# Patient Record
Sex: Male | Born: 1935 | Race: White | Hispanic: No | Smoking: Never smoker
Health system: Southern US, Community
[De-identification: ages and names within clinical notes are randomized; demographics above are authoritative.]

## PROBLEM LIST (undated history)

## (undated) DIAGNOSIS — H3552 Pigmentary retinal dystrophy: Secondary | ICD-10-CM

## (undated) DIAGNOSIS — M1990 Unspecified osteoarthritis, unspecified site: Secondary | ICD-10-CM

## (undated) DIAGNOSIS — F329 Major depressive disorder, single episode, unspecified: Secondary | ICD-10-CM

## (undated) DIAGNOSIS — F32A Depression, unspecified: Secondary | ICD-10-CM

## (undated) DIAGNOSIS — G4700 Insomnia, unspecified: Secondary | ICD-10-CM

## (undated) DIAGNOSIS — F419 Anxiety disorder, unspecified: Secondary | ICD-10-CM

## (undated) DIAGNOSIS — I10 Essential (primary) hypertension: Secondary | ICD-10-CM

## (undated) DIAGNOSIS — K219 Gastro-esophageal reflux disease without esophagitis: Secondary | ICD-10-CM

## (undated) DIAGNOSIS — C01 Malignant neoplasm of base of tongue: Secondary | ICD-10-CM

## (undated) HISTORY — DX: Pigmentary retinal dystrophy: H35.52

## (undated) HISTORY — DX: Malignant neoplasm of base of tongue: C01

## (undated) HISTORY — PX: TONSILECTOMY, ADENOIDECTOMY, BILATERAL MYRINGOTOMY AND TUBES: SHX2538

## (undated) HISTORY — PX: OTHER SURGICAL HISTORY: SHX169

## (undated) HISTORY — PX: KNEE SURGERY: SHX244

## (undated) HISTORY — DX: Insomnia, unspecified: G47.00

---

## 2014-02-15 DIAGNOSIS — G629 Polyneuropathy, unspecified: Secondary | ICD-10-CM | POA: Insufficient documentation

## 2014-10-23 DIAGNOSIS — K219 Gastro-esophageal reflux disease without esophagitis: Secondary | ICD-10-CM | POA: Insufficient documentation

## 2014-10-23 DIAGNOSIS — M25519 Pain in unspecified shoulder: Secondary | ICD-10-CM | POA: Insufficient documentation

## 2014-10-23 DIAGNOSIS — J302 Other seasonal allergic rhinitis: Secondary | ICD-10-CM | POA: Insufficient documentation

## 2014-10-24 HISTORY — PX: BIOPSY TONGUE: PRO39

## 2014-10-30 ENCOUNTER — Telehealth: Payer: Self-pay | Admitting: *Deleted

## 2014-10-30 NOTE — Telephone Encounter (Signed)
Pathology should be ready at [facility] 11/01/14.  Requested CD's today

## 2014-11-01 ENCOUNTER — Telehealth: Payer: Self-pay | Admitting: *Deleted

## 2014-11-01 NOTE — Telephone Encounter (Signed)
  Oncology Nurse Navigator Documentation Referral date to RadOnc/MedOnc: 10/25/14 (referral rec'd 10/31/14) (11/01/14 1454) Navigator Encounter Type: Introductory phone call (11/01/14 1454)     Placed introductory phone call to patient, spoke with his dtr [name]. Introduced myself at the Oncology Nurse Navigator that works with Dr. Isidore Moos with whom he has an appt 11/08/14 1230/1300.  She confirmed understanding. She explained that [place] approval is pending for this appt.   He is presently being seen by [facility]; Dr. [name] is his PCP, Dr. Aggie Moats is his ENT.  Dr. Aggie Moats referred pt to Dr. Conley Canal at [facility]. MedOnc referral has not been made pending further discussion between Dr. Aggie Moats and Dr. Conley Canal. She further stated their preference is he receive chemotherapy, if appropriate, at [facility] along with RT. I provided her my contact information, encouraged her to call with questions prior to next week's appt.  [name], RN, BSN, [place] at [place] [phone]                     Time Spent with Patient: 30 (11/01/14 1454)

## 2014-11-05 ENCOUNTER — Telehealth: Payer: Self-pay | Admitting: *Deleted

## 2014-11-05 NOTE — Telephone Encounter (Signed)
REQUESTED PATHOLOGY TO BE FAXED TO [place].  Helene Kelp will check with Dr. Conley Canal before faxing as path has been amended.

## 2014-11-07 ENCOUNTER — Telehealth: Payer: Self-pay | Admitting: *Deleted

## 2014-11-07 NOTE — Telephone Encounter (Signed)
Path received this afternoon.  [facility] cannot send CD's as they are from the [place] in [place].  Patient's family working on picking up CD at [facility].

## 2014-11-07 NOTE — Telephone Encounter (Signed)
  Oncology Nurse Navigator Documentation     Patient Visit Type: Follow-up (11/07/14 1653)     Called patient, spoke with his dtr. Informed her that [place] authorization was received today for her dad to be seen by RadOnc and [facility] at [facility].  I informed her of appts scheduled for tomorrow:  12:30/1:00 with Dr. Isidore Moos, 1400 with Dr. Alvy Bimler.  I encouraged her to call me tomorrow morning with any questions.   [name], RN, BSN, [place] at [place] [phone]                   Time Spent with Patient: 15 (11/07/14 1653)

## 2014-11-07 NOTE — Telephone Encounter (Signed)
Patient's son-in-law, Weyman Croon, says patient is going to [facility] this afternoon to pick up CD's.

## 2014-11-08 ENCOUNTER — Encounter: Payer: Self-pay | Admitting: Hematology and Oncology

## 2014-11-08 ENCOUNTER — Encounter: Payer: Self-pay | Admitting: Radiation Oncology

## 2014-11-08 ENCOUNTER — Ambulatory Visit (HOSPITAL_BASED_OUTPATIENT_CLINIC_OR_DEPARTMENT_OTHER): Payer: Medicare PPO | Admitting: Hematology and Oncology

## 2014-11-08 ENCOUNTER — Ambulatory Visit
Admission: RE | Admit: 2014-11-08 | Discharge: 2014-11-08 | Disposition: A | Discharge status: Home / Self Care | Payer: Medicare PPO | Source: Ambulatory Visit | Attending: Radiation Oncology | Admitting: Radiation Oncology

## 2014-11-08 ENCOUNTER — Ambulatory Visit: Admission: RE | Admit: 2014-11-08 | Payer: Non-veteran care | Source: Ambulatory Visit | Admitting: Radiation Oncology

## 2014-11-08 ENCOUNTER — Ambulatory Visit: Payer: Non-veteran care

## 2014-11-08 ENCOUNTER — Ambulatory Visit
Admission: RE | Admit: 2014-11-08 | Discharge: 2014-11-08 | Disposition: A | Discharge status: Home / Self Care | Payer: Non-veteran care | Source: Ambulatory Visit | Attending: Radiation Oncology | Admitting: Radiation Oncology

## 2014-11-08 ENCOUNTER — Encounter: Payer: Self-pay | Admitting: *Deleted

## 2014-11-08 VITALS — BP 122/46 | HR 63 | Temp 98.8°F | Resp 16 | Ht 69.0 in | Wt 169.2 lb

## 2014-11-08 VITALS — BP 123/68 | HR 65 | Temp 98.2°F | Resp 18 | Ht 69.0 in | Wt 169.8 lb

## 2014-11-08 DIAGNOSIS — R1310 Dysphagia, unspecified: Secondary | ICD-10-CM

## 2014-11-08 DIAGNOSIS — C01 Malignant neoplasm of base of tongue: Secondary | ICD-10-CM | POA: Insufficient documentation

## 2014-11-08 DIAGNOSIS — Z23 Encounter for immunization: Secondary | ICD-10-CM | POA: Diagnosis not present

## 2014-11-08 DIAGNOSIS — R042 Hemoptysis: Secondary | ICD-10-CM | POA: Insufficient documentation

## 2014-11-08 DIAGNOSIS — I10 Essential (primary) hypertension: Secondary | ICD-10-CM | POA: Diagnosis not present

## 2014-11-08 DIAGNOSIS — Z8041 Family history of malignant neoplasm of ovary: Secondary | ICD-10-CM | POA: Diagnosis not present

## 2014-11-08 DIAGNOSIS — R634 Abnormal weight loss: Secondary | ICD-10-CM

## 2014-11-08 DIAGNOSIS — Z510 Encounter for antineoplastic radiation therapy: Secondary | ICD-10-CM | POA: Diagnosis not present

## 2014-11-08 DIAGNOSIS — Z803 Family history of malignant neoplasm of breast: Secondary | ICD-10-CM | POA: Insufficient documentation

## 2014-11-08 DIAGNOSIS — F1010 Alcohol abuse, uncomplicated: Secondary | ICD-10-CM | POA: Diagnosis not present

## 2014-11-08 DIAGNOSIS — C029 Malignant neoplasm of tongue, unspecified: Secondary | ICD-10-CM

## 2014-11-08 HISTORY — DX: Essential (primary) hypertension: I10

## 2014-11-08 MED ORDER — INFLUENZA VAC SPLIT QUAD 0.5 ML IM SUSY
0.5000 mL | PREFILLED_SYRINGE | Freq: Once | INTRAMUSCULAR | Status: AC
  Administered 2014-11-08: 0.5 mL via INTRAMUSCULAR
  Filled 2014-11-08: qty 0.5

## 2014-11-08 NOTE — Progress Notes (Addendum)
Head and Neck Cancer Location of Tumor / Histology: left tongue base mass - invasive squamous cell carcinoma  Patient presented with left tongue mass that he and his family noticed in May.  He also reported his tongue felt numb and he thought he was having an allergic reaction.  He was thought to have a stroke but had an MRI and the mass was found.  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  See comment.   Nutrition Status Yes No Comments  Weight changes? [x]  []  Has lost about 45 lbs over a year.  Swallowing concerns? []  []  Reports having to cough occasionally when eating hard foods.  He is eating a softer diet.  PEG? [x]  []  Gastrostomy tube placed 10/24/14 - not using it now.   Referrals Yes No Comments  Social Work? []  [x]    Dentistry? []  [x]    Swallowing therapy? []  [x]    Nutrition? []  [x]    Med/Onc? [x]  []  To see Dr. Alvy Bimler today.   Safety Issues Yes No Comments  Prior radiation? []  [x]    Pacemaker/ICD? []  [x]    Possible current pregnancy? []  [x]    Is the patient on methotrexate? []  [x]     Tobacco/Marijuana/Snuff/ETOH use: never smoked, drinks occasional drink 1 per month, doesn't use marijuana/snuff.  Past/Anticipated interventions by otolaryngology, if any: surgery 10/24/14  Past/Anticipated interventions by medical oncology, if any: unknown  Current Complaints / other details:  Patient is here with his daughter.  BP 122/46 mmHg  Pulse 63  Temp(Src) 98.8 F (37.1 C) (Oral)  Resp 16  Ht 5\' 9"  (1.753 m)  Wt 169 lb 3.2 oz (76.749 kg)  BMI 24.98 kg/m2  SpO2 100%

## 2014-11-08 NOTE — Assessment & Plan Note (Signed)
This is related to the ulcerated mass. He will be referred to speech and language therapist for assessment and formal swallow evaluation

## 2014-11-08 NOTE — Progress Notes (Signed)
Oncology Nurse Navigator Documentation   Navigator Encounter Type: Initial MedOnc;Initial RadOnc (11/08/14 1240)     Barriers/Navigation Needs: Education (11/08/14 1240)     Met with patient during initial consult with Dr. Isidore Moos.  He was accompanied by his dtr [name].  1. Further introduced myself as his Navigator, explained my role as a member of the Care Team.   2. Provided New Patient Information packet, discussed contents:  Contact information for physician(s), myself, other members of the Care Team.  Advance Directive information ([place] blue pamphlet with LCSW contact info)  Fall Prevention Patient Safety Plan  Appointment Guideline  ACS Referral form  [place] campus map with highlight of [place] 3. Provided introductory explanation of radiation treatment including SIM planning and purpose of Aquaplast head and shoulder mask, showed them example.   4. He had "button" PEG placed 10/24/14 at [facility].  He reported he is not using PEG for nutritional/hydration purposes, nor is he flushing on a daily basis.    I explained importance of daily flushing with 60-120 cc free water to maintain patency.  He verbalized understanding.  Escorted pt to initial consult with Dr. Alvy Bimler.   1. Provided educational handout for PAC. 2. Reiterated importance of flushing PEG on daily basis.  Encouraged him to bring equipment to demonstrate skill 3. We discussed PEG care, I encouraged him to shower as usual, dry site well afterwards. 4. Showed them the location of Dr. Ritta Slot office and [facility] as reference for future appts, including arrival procedure for these appts.   5. They verbalized understanding of information provided.  I encouraged them to call with questions/concerns, they verbalized understanding.  [name], RN, BSN, [place] at [place] [phone]                Time Spent with Patient: 120  (11/08/14 1240)

## 2014-11-08 NOTE — Progress Notes (Signed)
Please see the Nurse Progress Note in the MD Initial Consult Encounter for this patient. 

## 2014-11-08 NOTE — Assessment & Plan Note (Addendum)
This is related to cancer. Dr. Isidore Moos had put in requested for him to see dietitian.  I reinforced the importance of adequate calorie intake and recommend he take nutritional supplements as tolerated

## 2014-11-08 NOTE — Progress Notes (Signed)
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 male is here because of newly diagnosed  Base of tongue cancer. I review his records extensively and collaborated the history with the patient. Summary of oncologic history as follows: Oncology History   Cancer of base of tongue   Staging form: Lip and Oral Cavity, AJCC 7th Edition     Clinical stage from 11/08/2014: Stage IVA (T4a, N2c, M0) - Signed by Heath Lark, MD on 11/08/2014       Cancer of base of tongue   07/08/2014 Miscellaneous  the patient noted tongue swelling / mass   10/04/2014 Imaging  CT scan done at the [facility] show large tongue mass with bilateral neck lymphadenopathy   10/07/2014 Miscellaneous  he was seen at the [facility] for evaluation was noted to have tongue cancer   10/10/2014 Imaging  PET CT scan from the [facility] showed extensive regional lymphadenopathy on the left side including supraclavicular lymph node involvement and possibly right jugulodigastric lymph node involvement.   10/24/2014 Procedure  he underwent laryngoscopy and biopsy. there is a large ulcerative tongue a mass on the left involving the tonsil, soft palate, epiglottis.   10/24/2014 Pathology Results  pathology report from [facility] show squamous cell carcinoma with basaloid feature, P 16 positive by PCR   he has baseline bilateral hearing deficit. He has difficulties with chewing food, mild swallowing difficulties, mild painful swallowing, and 45 pounds abnormal weight loss. He also complained of intermittent hemoptysis. He has slurring as patient to do the tongue mass. Recently, he had a fall due to his tunnel vision from retinitis pigmentosa. He denies balance difficulties. He has a feeding tube placed but has not started using it yet. He rated his throat pain at 3  out of 10 and he uses Aleve as needed for pain. He complained of fatigue and anorexia  MEDICAL HISTORY:  Past Medical History  Diagnosis Date  . Tongue cancer   . Hypertension   . Retinitis pigmentosa of both eyes     SURGICAL HISTORY: Past Surgical History  Procedure Laterality Date  . Biopsy tongue  10/24/14  . Knee surgery Right   . Tonsilectomy, adenoidectomy, bilateral myringotomy and tubes      SOCIAL HISTORY: Social History   Social History  . Marital Status: Unknown    Spouse Name: N/A  . Number of Children: 2  . Years of Education: N/A   Occupational History  . Acupuncturist    Social History Main Topics  . Smoking status: Never Smoker   . Smokeless tobacco: Never Used  . Alcohol Use: 0.0 oz/week    0 Standard drinks or equivalent per week     Comment: 1 drink per month  . Drug Use: Not on file  . Sexual Activity: Not on file   Other Topics Concern  . Not on file   Social History Narrative    FAMILY HISTORY: Family History  Problem Relation Age of Onset  . Ovarian cancer Sister   . Heart attack Father   . Breast cancer      neice on Sister's side.  Also had tongue cancer.    ALLERGIES:  has No Known Allergies.  MEDICATIONS:  Current Outpatient Prescriptions  Medication Sig Dispense Refill  . cetirizine (ZYRTEC) 10 MG tablet  Take 10 mg by mouth.    . fluticasone (FLONASE) 50 MCG/ACT nasal spray Place 1 spray into the nose.    . Menthol, Topical Analgesic, 16 % LIQD     . metoprolol (LOPRESSOR) 50 MG tablet Take 25 mg by mouth 2 (two) times daily.     . naproxen sodium (RA NAPROXEN SODIUM) 220 MG tablet Take 220 mg by mouth.    . Nutritional Supplements (FEEDING SUPPLEMENT, OSMOLITE 1.5 CAL,) LIQD 240 mLs by Nasogastric route.    Marland Kitchen omeprazole (PRILOSEC) 20 MG capsule Take 20 mg by mouth.    . sodium chloride (V-R NASAL SPRAY SALINE) 0.65 % nasal spray Place 1 spray into the nose.    . zolpidem (AMBIEN) 10 MG tablet Take 5 mg by mouth.      No current facility-administered medications for this visit.    REVIEW OF SYSTEMS:   Constitutional: Denies fevers, chills or abnormal night sweats Eyes: Denies blurriness of vision, double vision or watery eyes Respiratory: Denies cough, dyspnea or wheezes Cardiovascular: Denies palpitation, chest discomfort or lower extremity swelling Gastrointestinal:  Denies nausea, heartburn or change in bowel habits Skin: Denies abnormal skin rashes Lymphatics: Denies new lymphadenopathy or easy bruising Neurological:Denies numbness, tingling or new weaknesses Behavioral/Psych: Mood is stable, no new changes  All other systems were reviewed with the patient and are negative.  PHYSICAL EXAMINATION: ECOG PERFORMANCE STATUS: 1 - Symptomatic but completely ambulatory  Filed Vitals:   11/08/14 1427  BP: 123/68  Pulse: 65  Temp: 98.2 F (36.8 C)  Resp: 18   Filed Weights   11/08/14 1427  Weight: 169 lb 12.8 oz (77.021 kg)    GENERAL:alert, no distress and comfortable. He looks elderly SKIN: skin color, texture, turgor are normal, no rashes or significant lesions EYES: normal, conjunctiva are pink and non-injected, sclera clear OROPHARYNX:noted tongue deviation and mass at the base of the tongue with mild abnormality in the left tonsillar region NECK: supple, thyroid normal size, non-tender, without nodularity LYMPH:  Palpable cervical lymphadenopathy in the neck LUNGS: clear to auscultation and percussion with normal breathing effort HEART: regular rate & rhythm and no murmurs and no lower extremity edema ABDOMEN:abdomen soft, non-tender and normal bowel sounds. Feeding tube site looks okay Musculoskeletal:no cyanosis of digits and no clubbing  PSYCH: alert & oriented x 3 with fluent speech NEURO: no focal motor/sensory deficits  LABORATORY DATA:  I have reviewed the data as listed RADIOGRAPHIC STUDIES: I have personally reviewed the radiological images as listed and agreed with the  findings in the report.  ASSESSMENT:  Newly diagnosed squamous cell carcinoma of the Head & Neck, HPV Positive  PLAN:  Cancer of base of tongue  We have extensive discussion. The patient nderstands the importance of multidisciplinary approach.  I think the patient would benefit from concurrent chemoradiation treatment. However, with his bilateral hearing loss at baseline, I would not recommend the use of cisplatin. The patient would be a candidate for cetuximab. He would need port placement and chemotherapy education class. I would hold off scheduling those until he has full dental evaluation. I will see him back prior to start of treatment. We discussed briefly the benefit of addition of concurrent systemic treatment. He agreed with the plan of care.  Weight loss This is related to cancer. Dr. Isidore Moos had put in requested for him to see dietitian.  I reinforced the importance of adequate calorie intake and recommend he take nutritional supplements as tolerated  Hemoptysis  He had intermittent  hemoptysis related to the ulcerated mass. We will observe for now.  Dysphagia  This is related to the ulcerated mass. He will be referred to speech and language therapist for assessment and formal swallow evaluation    All questions were answered. The patient knows to call the clinic with any problems, questions or concerns. I spent 55 minutes counseling the patient face to face. The total time spent in the appointment was 60 minutes and more than 50% was on counseling.     [facility], [place], MD 11/08/2014 3:54 PM

## 2014-11-08 NOTE — Assessment & Plan Note (Signed)
We have extensive discussion. The patient nderstands the importance of multidisciplinary approach.  I think the patient would benefit from concurrent chemoradiation treatment. However, with his bilateral hearing loss at baseline, I would not recommend the use of cisplatin. The patient would be a candidate for cetuximab. He would need port placement and chemotherapy education class. I would hold off scheduling those until he has full dental evaluation. I will see him back prior to start of treatment. We discussed briefly the benefit of addition of concurrent systemic treatment. He agreed with the plan of care.

## 2014-11-08 NOTE — Assessment & Plan Note (Signed)
He had intermittent hemoptysis related to the ulcerated mass. We will observe for now.

## 2014-11-08 NOTE — Addendum Note (Signed)
Encounter addended by: Jacqulyn Liner, RN on: 11/08/2014  2:42 PM<BR>     Documentation filed: Charges VN

## 2014-11-08 NOTE — Progress Notes (Signed)
Radiation Oncology         [phone] ________________________________  Initial Outpatient Consultation  Name: Kenneth Lembke. Jordan MRN: [id number]  Date: 11/08/2014  DOB: 1935/03/02  CC:No primary care provider on file.  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 Cancer of base of tongue 141.0 C01     HISTORY OF PRESENT ILLNESS:Kenneth Jordan is a 79 y.o. male who presented with invasive cell carcinoma with basaloid features of the left tongue.  "When I was a kid I used to have ulcers in my mouth. I had a burning sensation on my tongue," the patient stated thinking that he simply had an ulcer on his tongue, when he first noticed something was out of the ordinary. "The original pain was on my tongue. It was swollen a little bit." On May 13th of 2016, he passed out briefly coming out of the shower due to being four different blood pressure medications, which caused his family to take him to the [place] doctor. After changing blood pressure medications, there was no alleviation of symptoms, and his speech became effected in June of 2016.  The patient presented with left tongue mass that he and his family noticed in May.  He also reported his tongue felt numb and he thought he was having an allergic reaction.  He was thought to have a stroke but had an MRI and the mass was found. He has trouble physically speaking and forming his words. On August 5th of 2016 the "doctor at the [place] in [place]" diagnosed the patient and recommended surgery. However, the recommended surgery could not be completed at this facility, so he was referred to Dr. Conley Canal. However, Dr. Conley Canal upon meeting the patient did not agree with the recommendation of surgery. Instead, Dr. Conley Canal suggested a biopsy take place, a feeding tube be administered and radiation therapy  treatments were best to manage the patient's disease. A biopsy was only performed at [facility]. Scans that were completed were not available earlier today. ([facility] that [facility] had to be picked [facility] in person.) The patient stated that the scans were completed in [place], [place] and provided disks at today's appointment.  I have reviewed his imaging which is provided via disks, though I do not have the paper reports. A CT scan of the neck on 10/04/2014 shows a left vasive tongue mass, extending into the oral tongue, at least 5cm in anterior to posterior measurement, crossing midline. He has a conglomerate of lymph nodes that appear pathologic on the left side of his cervical neck, extending from levels 2 through 4. As well as a 1.5cm lymph node in the level II region of the right neck, which appears malignant. He has a 50mm left retropharyngeal node that appears pathological. The PET scan performed on 10/09/2014 confirms hypermetabolic activity in the base of tongue mass and bilateral neck disease. I cannot appreciate any distance metastatic disease to my eye.  Other symptoms of concern vocalized include the patient spitting up blood. "That's the first thing that scared me," the patient explained, that he has spit up blood several times, including yesterday morning. In describing the blood being spit up, "It's no coagulated." He also reports having trouble swallowing, but is not currently choking on food. He has lost forty-five pounds over the past year. The patient confirms symptoms of fatigue, but he's  able to dress himself and perform other duties alone without assistance. He is currently having to live locally with his daughter. This situation frustrated the patient as he just moved into an apartment in [place], [place]. The patient's daughter expressed that the [place] may  cover dental expenses, which will encourage her father to visit the dentist on a more frequent basis. The patient was concerned about  being a burden on his daughter.  Biopsies revealed: 10/24/2014  HPV testing result was positive for HPV HR Type 16 At time of laryngoscopy, Dr. Jefm [facility] at [facility] appreciated a large left tongue base mass   FINAL PATHOLOGIC DIAGNOSIS  MICROSCOPIC EXAMINATION AND DIAGNOSIS    A.OROPHARYNX, LEFT TONGUE BASE, BIOPSY:  Invasive squamous cell carcinoma with basaloid features.    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  See comment.   Nutrition Status Yes No Comments  Weight changes? [x]  []  Has lost about 45 lbs over a year.  Swallowing concerns? []  []  Reports having to cough occasionally when eating hard foods.  He is eating a softer diet.  PEG? [x]  []  Gastrostomy tube placed 10/24/14 - not using it now.   Referrals Yes No Comments  Social Work? []  [x]    Dentistry? []  [x]    Swallowing therapy? []  [x]    Nutrition? []  [x]    Med/Onc? [x]  []  To see Dr. Alvy Bimler today.   Safety Issues Yes No Comments  Prior radiation? []  [x]    Pacemaker/ICD? []  [x]    Possible current pregnancy? []  [x]    Is the patient on methotrexate? []  [x]     Tobacco/Marijuana/Snuff/ETOH use: never chewed tobacco or smoked, drinks occasional drink 1 per month, doesn't use marijuana/snuff.  Past/Anticipated interventions by otolaryngology, if any: bx 10/24/2014; not felt to be a good surgical candidate  Past/Anticipated interventions by medical oncology, if any: unknown  Current Complaints / other details:  Patient is here with his daughter. They are frustrated with complicated communication with the [place]. He does not have the support in [place] to sustain treatments in the [place]: No  PAST MEDICAL HISTORY:  has a past medical history of Tongue cancer and Hypertension.    PAST SURGICAL HISTORY: Past Surgical History  Procedure Laterality Date  . Biopsy tongue  10/24/14  . Knee surgery  Right   . Tonsilectomy, adenoidectomy, bilateral myringotomy and tubes      FAMILY HISTORY: family history includes Breast cancer in an other family member; Heart attack in his father; Ovarian cancer in his sister.  SOCIAL HISTORY:  reports that he has never smoked. He has never used smokeless tobacco. He reports that he drinks alcohol.  ALLERGIES: Review of patient's allergies indicates no known allergies.  MEDICATIONS:  Current Outpatient Prescriptions  Medication Sig Dispense Refill  . cetirizine (ZYRTEC) 10 MG tablet Take 10 mg by mouth.    . fluticasone (FLONASE) 50 MCG/ACT nasal spray Place 1 spray into the nose.    . Menthol, Topical Analgesic, 16 % LIQD     . metoprolol (LOPRESSOR) 50 MG tablet Take 25 mg by mouth 2 (two) times daily.     . naproxen sodium (RA NAPROXEN SODIUM) 220 MG tablet Take 220 mg by mouth.    Marland Kitchen omeprazole (PRILOSEC) 20 MG capsule Take 20 mg by mouth.    . sodium chloride (V-R NASAL SPRAY SALINE) 0.65 % nasal spray Place 1 spray into the nose.    . zolpidem (AMBIEN) 10 MG tablet Take  5 mg by mouth.    . Nutritional Supplements (FEEDING SUPPLEMENT, OSMOLITE 1.5 CAL,) LIQD 240 mLs by Nasogastric route.     No current facility-administered medications for this encounter.    REVIEW OF SYSTEMS:  Notable for that above.   PHYSICAL EXAM:  height is 5\' 9"  (1.753 m) and weight is 169 lb 3.2 oz (76.749 kg). His oral temperature is 98.8 F (37.1 C). His blood pressure is 122/46 and his pulse is 63. His respiration is 16 and oxygen saturation is 100%.   General: Alert and oriented, in no acute distress HEENT: Head is normocephalic. Extraocular movements are intact.  Bilateral hearing aids in place. He has some missing teeth and teeth of not good repair, in the molar regions. No dentures or false teeth are noted. The tongue deviates to the right. There is some fullness in the left soft palate, but no exophytic process visible. There is some palpable nodularity over  the posterior left tongue boarding on the base of tongue. Neck: Neck is notable for no palpable masses appreciated in the right cervical or right supraclavicular regions. There is an elongated, firm mass along the sternocleidomastoid muscle on the left, extending from levels 2 through 3  Heart: Regular in rate and rhythm with no murmurs, rubs, or gallops. Chest: Clear to auscultation bilaterally, with no rhonchi, wheezes, or rales. Abdomen: Soft, nontender, nondistended, with no rigidity or guarding. Extremities: No cyanosis or edema. History of gout in the right foot. Lymphatics: see Neck Exam Skin: No concerning lesions. Musculoskeletal: symmetric strength and muscle tone throughout. Neurologic: Cranial nerves II through XII are grossly intact. No obvious focalities. Speech is fluent. Coordination is intact. Psychiatric: Judgment and insight are intact. Affect is appropriate. Upon examination of his PEG tube the site is clean, dry, and intact.  ECOG = 2  0 - Asymptomatic (Fully active, able to carry on all predisease activities without restriction)  1 - Symptomatic but completely ambulatory (Restricted in physically strenuous activity but ambulatory and able to carry out work of a light or sedentary nature. For example, light housework, office work)  2 - Symptomatic, <50% in bed during the day (Ambulatory and capable of all self care but unable to carry out any work activities. Up and about more than 50% of waking hours)  3 - Symptomatic, >50% in bed, but not bedbound (Capable of only limited self-care, confined to bed or chair 50% or more of waking hours)  4 - Bedbound (Completely disabled. Cannot carry on any self-care. Totally confined to bed or chair)  5 - Death   Eustace Pen MM, Creech RH, Tormey DC, et al. [phone]). "Toxicity and response criteria of the [facility]". [place] Oncol. 5 (6): 649-55   LABORATORY DATA:  No results found for: WBC, HGB, HCT, MCV,  PLT CMP  No results found for: NA, K, CL, CO2, GLUCOSE, BUN, CREATININE, CALCIUM, PROT, ALBUMIN, AST, ALT, ALKPHOS, BILITOT, GFRNONAA, GFRAA       RADIOGRAPHY: as above    IMPRESSION/PLAN:  This is a delightful patient with head and neck cancer. I do recommend radiotherapy for this patient.  We discussed the potential risks, benefits, and side effects of radiotherapy. We talked in detail about acute and late effects. We discussed that some of the most bothersome acute effects may be mucositis, dysgeusia, salivary changes, skin irritation, hair loss, dehydration, weight loss and fatigue. We talked about late effects which include but are not necessarily limited to dysphagia, hypothyroidism, nerve injury,  spinal cord injury, xerostomia, trismus, and neck edema. No guarantees of treatment were given. A consent form was signed and placed in the patient's medical record. The patient is enthusiastic about proceeding with treatment. I look forward to participating in the patient's care.   Simulation (treatment planning) will take place after release by dentistry  We also discussed that the treatment of head and neck cancer is a multidisciplinary process to maximize treatment outcomes and quality of life. For this reasons the following referrals have been or will be made:    Medical oncology to discuss chemotherapy     Dentistry for dental evaluation, possible extractions in the radiation fields, and /or advice on reducing risk of cavities, osteoradionecrosis, or other oral issues.    Nutritionist for nutrition support during and after treatment.    Speech language pathology for swallowing and/or speech therapy.    Social work for social support.     Physical therapy due to risk of lymphedema in neck and deconditioning.    Baseline labs including TSH.  He is aware of his appointment with Dr. Alvy Bimler to take place later today (11/08/2014) to discuss treatment options such as chemotherapy. The  patient and his daughter was briefly educated on the difference between an MRI and a PET scan, as there was confusion beforehand. He is advised of a follow up appointment with radiation oncology to take place in the future to discuss the best treatment plan in managing his disease. Surgery alone is not recommended at this time. The patient has been instructed to see the dentist Dr. Enrique Sack as soon as possible. Referrals to a nutritionist due to significant weight loss, a swallowing therapist, and a physical therapist were all explained during today's appointment. The patient is additional aware of the purpose of baseline thyroid tests to take place. All vocalized questions and concerns have been addressed. If the patient develops any further questions or concerns in regards to her treatment and recovery, she has been encouraged to contact Dr. Isidore Moos.    This document serves as a record of services personally performed by Eppie Gibson, MD. It was created on her behalf by Lenn Cal, a trained medical scribe. The creation of this record is based on the scribe's personal observations and the provider's statements to them. This document has been checked and approved by the attending provider. __________________________________________   Eppie Gibson, MD

## 2014-11-11 ENCOUNTER — Encounter: Payer: Self-pay | Admitting: *Deleted

## 2014-11-11 ENCOUNTER — Telehealth: Payer: Self-pay | Admitting: *Deleted

## 2014-11-11 ENCOUNTER — Telehealth (HOSPITAL_COMMUNITY): Payer: Self-pay | Admitting: Dentistry

## 2014-11-11 NOTE — Telephone Encounter (Signed)
[facility] received and physician notes.  Patient brought CD of CT & PET

## 2014-11-11 NOTE — Progress Notes (Signed)
  Oncology Nurse Navigator Documentation    Navigator Encounter Type: Other (11/11/14 1015)         Interventions: Coordination of Care (11/11/14 1015)     Delivered CD with patient's CT and PET imaging to [facility] in [facility] for import into [place], requested disc return to Dr. Isidore Moos.  She verbalized understanding.  [name], RN, BSN, [place] [facility] [place] [phone]           Time Spent with Patient: 15 (11/11/14 1015)

## 2014-11-11 NOTE — Telephone Encounter (Signed)
11/11/2014  Patient:            Kenneth Jordan. Steil Date of Birth:  Aug 02, 1935 MRN:                [id number]   I [facility] for daughter to call me about scheduling dental consult appointment for tomorrow at 8 AM. Daughter called back and is trying to see if [facility] approval for dental appointment is available. Daughter is aware of the need to schedule this appointment ASAP and that appointment may be unavailable until next week if unable to keep this appointment. Lattie Haw Ingram/Dr. Enrique Sack

## 2014-11-12 ENCOUNTER — Encounter: Payer: Self-pay | Admitting: *Deleted

## 2014-11-12 ENCOUNTER — Other Ambulatory Visit: Payer: Self-pay | Admitting: Radiation Oncology

## 2014-11-12 ENCOUNTER — Ambulatory Visit (HOSPITAL_COMMUNITY): Payer: Self-pay | Admitting: Dentistry

## 2014-11-12 ENCOUNTER — Encounter (HOSPITAL_COMMUNITY): Payer: Self-pay | Admitting: Dentistry

## 2014-11-12 VITALS — BP 118/54 | HR 59 | Temp 98.6°F

## 2014-11-12 DIAGNOSIS — K030 Excessive attrition of teeth: Secondary | ICD-10-CM

## 2014-11-12 DIAGNOSIS — C01 Malignant neoplasm of base of tongue: Secondary | ICD-10-CM

## 2014-11-12 DIAGNOSIS — M264 Malocclusion, unspecified: Secondary | ICD-10-CM

## 2014-11-12 DIAGNOSIS — K031 Abrasion of teeth: Secondary | ICD-10-CM

## 2014-11-12 DIAGNOSIS — K08109 Complete loss of teeth, unspecified cause, unspecified class: Secondary | ICD-10-CM

## 2014-11-12 DIAGNOSIS — Z01818 Encounter for other preprocedural examination: Secondary | ICD-10-CM

## 2014-11-12 DIAGNOSIS — K029 Dental caries, unspecified: Secondary | ICD-10-CM

## 2014-11-12 DIAGNOSIS — M270 Developmental disorders of jaws: Secondary | ICD-10-CM

## 2014-11-12 DIAGNOSIS — IMO0002 Reserved for concepts with insufficient information to code with codable children: Secondary | ICD-10-CM

## 2014-11-12 DIAGNOSIS — K036 Deposits [accretions] on teeth: Secondary | ICD-10-CM

## 2014-11-12 DIAGNOSIS — K0530 Chronic periodontitis, unspecified: Secondary | ICD-10-CM

## 2014-11-12 NOTE — Patient Instructions (Signed)

## 2014-11-12 NOTE — Progress Notes (Signed)
DENTAL CONSULTATION  Date of Consultation:  11/12/2014   Patient Name:   Kenneth Jordan. Kenneth Jordan Date of Birth:   Jul 12, 1935 Medical Record Number: [id number]  VITALS: BP 118/54 mmHg  Pulse 59  Temp(Src) 98.6 F (37 C) (Oral)  CHIEF COMPLAINT: The patient was referred by Dr. Isidore Moos for a dental consultation.  HPI: Kenneth Jordan. Reznik is a 79 year old male recently diagnosed with squamous cell carcinoma of the left base of tongue. Patient with anticipated chemoradiation therapy. Patient is now seen as part of a medically necessary pre-chemoradiation therapy dental protocol examination.  Patient currently denies acute toothaches, swellings, or abscesses. The patient was last seen by dentist 5-10 years ago to have a "filling placed". The patient denies having any partial dentures. The patient indicates that his previous dentist has retired.   PROBLEM LIST: Patient Active Problem List   Diagnosis Date Noted  . Cancer of base of tongue 11/08/2014    Priority: High  . Weight loss 11/08/2014  . Hemoptysis 11/08/2014  . Dysphagia 11/08/2014    PMH: Past Medical History  Diagnosis Date  . Cancer of base of tongue     Left BOT  . Hypertension   . Retinitis pigmentosa of both eyes     PSH: Past Surgical History  Procedure Laterality Date  . Biopsy tongue  10/24/14  . Knee surgery Right   . Tonsilectomy, adenoidectomy, bilateral myringotomy and tubes      ALLERGIES: No Known Allergies  MEDICATIONS: Current Outpatient Prescriptions  Medication Sig Dispense Refill  . cetirizine (ZYRTEC) 10 MG tablet Take 10 mg by mouth.    . fluticasone (FLONASE) 50 MCG/ACT nasal spray Place 1 spray into the nose.    . Menthol, Topical Analgesic, 16 % LIQD     . metoprolol (LOPRESSOR) 50 MG tablet Take 25 mg by mouth 2 (two) times daily.     . naproxen sodium (RA NAPROXEN SODIUM) 220 MG tablet Take 220 mg by mouth.    . Nutritional Supplements (FEEDING SUPPLEMENT, OSMOLITE 1.5 CAL,) LIQD 240 mLs by  Nasogastric route.    Marland Kitchen omeprazole (PRILOSEC) 20 MG capsule Take 20 mg by mouth.    . sodium chloride (V-R NASAL SPRAY SALINE) 0.65 % nasal spray Place 1 spray into the nose.    . zolpidem (AMBIEN) 10 MG tablet Take 5 mg by mouth.     No current facility-administered medications for this visit.    LABS: No results found for: WBC, HGB, HCT, MCV, PLT No results found for: NA, K, CL, CO2, GLUCOSE, BUN, CREATININE, CALCIUM, GFRNONAA, GFRAA No results found for: INR, PROTIME No results found for: PTT  SOCIAL HISTORY: Social History   Social History  . Marital Status: Unknown    Spouse Name: N/A  . Number of Children: 2  . Years of Education: N/A   Occupational History  . Acupuncturist    Social History Main Topics  . Smoking status: Never Smoker   . Smokeless tobacco: Never Used  . Alcohol Use: 0.0 oz/week    0 Standard drinks or equivalent per week     Comment: 1 drink per month  . Drug Use: Not on file  . Sexual Activity: Not on file   Other Topics Concern  . Not on file   Social History Narrative    FAMILY HISTORY: Family History  Problem Relation Age of Onset  . Ovarian cancer Sister   . Heart attack Father   . Breast cancer      neice  on Sister's side.  Also had tongue cancer.    REVIEW OF SYSTEMS: Reviewed with the patient and is included in the dental record.  DENTAL HISTORY: CHIEF COMPLAINT: The patient was referred by Dr. Isidore Moos for a dental consultation.  HPI: Kenneth Jordan. Gair is a 79 year old male recently diagnosed with squamous cell carcinoma of the left base of tongue. Patient with anticipated chemoradiation therapy. Patient is now seen as part of a medically necessary pre-chemoradiation therapy dental protocol examination.  Patient currently denies acute toothaches, swellings, or abscesses. The patient was last seen by dentist 5-10 years ago to have a "filling placed". The patient denies having any partial dentures. The patient indicates that  his previous dentist has retired.  DENTAL EXAMINATION: GENERAL: The patient is a well-developed, well-nourished male in no acute distress.  HEAD AND NECK: The patient has right and left neck lymphadenopathy. The patient denies acute TMJ symptoms. Patient has a maximum interincisal opening of 45 mm. Patient's tongue deviates to the right on maximum opening.  INTRAORAL EXAM:  The patient has normal saliva. The patient has bilateral mandibular lingual tori. The patient has facial exostoses in the area of tooth numbers 22 through 27. The left lateral tongue is firm upon palpation in the posterior aspect. Access to the mandibular posterior teeth is limited on the left side. DENTITION:  The patient is missing tooth numbers 3, 12, 13, 14, 18, 19, 29, 30, and 32. The patient has a mandibular anterior attrition. The patient has multiple flexure lesions.  The patient has multiple rotated teeth. The patient has multiple diastemas. PERIODONTAL: The patient has chronic periodontitis with plaque and calculus accumulations, gingival recession, and tooth mobility as charted.  DENTAL CARIES/SUBOPTIMAL RESTORATIONS: The patient has multiple dental caries and suboptimal dental restorations noted. ENDODONTIC: The patient currently denies acute pulpitis symptoms. I do not see any evidence of periapical pathology.  CROWN AND BRIDGE: There are no crown or bridge restorations noted. PROSTHODONTIC: The patient denies having any partial dentures.  OCCLUSION: The patient has a poor occlusal scheme secondary to multiple missing teeth, supra-eruption and drifting of the unopposed teeth into the edentulous areas, multiple diastemas, multiple rotated teeth, and lack of replacement of the missing teeth with dental prostheses.   RADIOGRAPHIC INTERPRETATIONan orthopantogram was taken today. This was supplemented with 9 periapical radiographs. Patient would not allow lower left posterior and mandibular anterior periapical radiographs  due to patient discomfort. There are multiple missing teeth. There is supra-eruption and drifting of the unopposed teeth into the edentulous areas. There is moderate bone loss noted. Multiple dental caries are noted. Multiple diastemas are noted. Multiple rotated teeth are noted. There is pneumatization of the bilateral maxillary sinuses.   ASSESSMENTS: 1. Squamous cell carcinoma of the left base of tongue 2. Anticipated chemoradiation therapy 3. Pre-chemoradiation therapy dental protocol 4. Multiple dental caries 5. Multiple flexure lesions 6. Mandibular anterior incisal attrition 7. Multiple missing teeth 8. Supra-eruption and drifting of the unopposed teeth into the edentulous areas 9. Multiple rotated teeth 10. Multiple diastemas 11. Bilateral mandibular lingual tori 12. Pneumatization of the maxillary sinuses 13. Chronic periodontitis with bone loss 14. Accretions 15. Gingival recession 16. Tooth mobility  17. Risk for sinus involvement with anticipated extraction of maxillary molar teeth.  PLAN/RECOMMENDATIONS: 1. I discussed the risks, benefits, and complications of various treatment options with the patient and his daughter in relationship to his medical and dental conditions, anticipated chemoradiation therapy, and chemoradiation therapy side effects to include xerostomia, radiation caries, trismus, mucositis, taste  changes, gum and jawbone changes, and risk for infection, bleeding, and osteoradionecrosis. We discussed various treatment options to include no treatment, multiple extractions with alveoloplasty, pre-prosthetic surgery as indicated, periodontal therapy,dental restorations, root canal therapy, crown and bridge therapy, implant therapy, and replacement of missing teeth as indicated. The patient currently is considering extraction of tooth numbers 1, 15, 16, 17, 20, and 31 with alveoloplasty and gross debridement of remaining dentition in the the operating room with general  anesthesia.  This treatment will be followed by fabrication of upper and lower fluoride trays and scatter guards as needed and dental restoration of tooth numbers 5, 7, and 8 as time and space permits prior to start of chemoradiation therapy. Patient did agree to impressions today for future fabrication of fluoride trays as indicated.  The patient and daughter did not wish to proceed with bilateral mandibular tori reductions due to the complexity of that procedure and risk for complications and delayed administration of chemoradiation therapy. Patient and daughter also refused referral to an oral surgeon at this time. Patient or daughter will contact dental medicine when they are ready to schedule the dental procedures in the operating room.   2. Discussion of findings with medical team and coordination of future medical and dental care as needed.  I will discuss anticipated ports and doses with Dr. Isidore Moos to determine if the lower left premolar #20 will be in the primary field of radiation therapy.  I spent in excess of  120 minutes during the conduct of this consultation and >50% of this time involved direct face-to-face encounter for counseling and/or coordination of the patient's care.   Lenn Cal, DDS

## 2014-11-13 ENCOUNTER — Inpatient Hospital Stay
Admission: RE | Admit: 2014-11-13 | Discharge: 2014-11-13 | Disposition: A | Discharge status: Home / Self Care | Payer: Self-pay | Source: Ambulatory Visit | Attending: Radiation Oncology | Admitting: Radiation Oncology

## 2014-11-13 ENCOUNTER — Encounter: Payer: Self-pay | Admitting: Hematology and Oncology

## 2014-11-13 ENCOUNTER — Encounter: Payer: Self-pay | Admitting: Radiation Oncology

## 2014-11-13 ENCOUNTER — Telehealth: Payer: Self-pay | Admitting: *Deleted

## 2014-11-13 ENCOUNTER — Telehealth: Payer: Self-pay | Admitting: Radiation Oncology

## 2014-11-13 ENCOUNTER — Other Ambulatory Visit: Payer: Self-pay | Admitting: Radiation Oncology

## 2014-11-13 DIAGNOSIS — C801 Malignant (primary) neoplasm, unspecified: Secondary | ICD-10-CM

## 2014-11-13 NOTE — Progress Notes (Signed)
Called pt to introduce myself. Left pt a message with contact information.

## 2014-11-13 NOTE — Telephone Encounter (Signed)
  Oncology Nurse Navigator Documentation   Navigator Encounter Type: Telephone (11/13/14 1245)         Interventions: Coordination of Care (11/13/14 1245)     Received call from patient dtr [name] who stated she talked with Juanda Bond with [facility] [phone]) re coverage for dental extractions recommended by Dr. Enrique Sack. Ms. Doyle Askew indicated dental procedure needs to be deemed medically necessary as part of proposed RT. I conferred with Ailene Ravel Hobgood, RadOnc Admin, who called Ms. Olen Pel. Ailene Ravel was told that claim must first be submitted at which point documentation substantiating medical necessity will be requested by [facility]. I relayed this information to [facility], she verbalized understanding.  [name], RN, BSN, [place] [facility] [place] [phone]           Time Spent with Patient: 30 (11/13/14 1245)

## 2014-11-13 NOTE — Progress Notes (Signed)
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 It is in close proximity to many of his tooth roots.  The patient is preparing to start radiotherapy to the mouth.  Some of the tooth roots will be receiving a significant dose, some expected to receive >50-60Gy, which increases his risk of dental complications/non healing jaw wounds in the future.  Anticipated xerostomia from radiotherapy also increases his risk of dental complications. Prophylactic dental extractions and dental care should reduce his risk of such complications per the recommendations of a dentist, in this case, Dr. Enrique Sack.   This recommendation is in keeping with national guidelines for the management of head and neck cancer.   [email or web address]  -----------------------------------  Eppie Gibson, MD

## 2014-11-13 NOTE — Telephone Encounter (Signed)
Phoned daughter to discuss father's upcoming speech therapy appointment. No answer. Left message requesting return call.

## 2014-11-14 NOTE — Progress Notes (Signed)
Please put orders in Epic surgery 11-20-14 pre op 11-18-14 Thanks

## 2014-11-15 NOTE — Patient Instructions (Addendum)
TYWAN SIEVER  11/15/2014   Your procedure is scheduled on: 11/20/2014    Report to [facility]  Entrance take [place]  elevators to 3rd floor to  [place] at     [place] AM.  Call this number if you have problems the morning of surgery [phone]   Remember: ONLY 1 PERSON MAY GO WITH YOU TO SHORT STAY TO GET  READY MORNING OF [place].  Do not eat food or drink liquids :After Midnight.     Take these medicines the morning of surgery with A SIP OF WATER: Flonase, metoprolol ( Lopressor) , visine if needed                                You may not have any metal on your body including hair pins and              piercings  Do not wear jewelry, , lotions, powders or perfumes, deodorant                        Men may shave face and neck.   Do not bring valuables to the hospital. [place].  Contacts, dentures or bridgework may not be worn into surgery.       Patients discharged the day of surgery will not be allowed to drive home.  Name and phone number of your driver:  Special Instructions: coughing and deep breathing exercises, leg exercises               Please read over the following fact sheets you were given: _____________________________________________________________________             [facility] - Preparing for Surgery Before surgery, you can play an important role.  Because skin is not sterile, your skin needs to be as free of germs as possible.  You can reduce the number of germs on your skin by washing with CHG (chlorahexidine gluconate) soap before surgery.  CHG is an antiseptic cleaner which kills germs and bonds with the skin to continue killing germs even after washing. Please DO NOT use if you have an allergy to CHG or antibacterial soaps.  If your skin becomes reddened/irritated stop using the CHG and inform your nurse when you arrive at Short Stay. Do not shave (including legs  and underarms) for at least 48 hours prior to the first CHG shower.  You may shave your face/neck. Please follow these instructions carefully:  1.  Shower with CHG Soap the night before surgery and the  morning of Surgery.  2.  If you choose to wash your hair, wash your hair first as usual with your  normal  shampoo.  3.  After you shampoo, rinse your hair and body thoroughly to remove the  shampoo.                           4.  Use CHG as you would any other liquid soap.  You can apply chg directly  to the skin and wash                       Gently  with a scrungie or clean washcloth.  5.  Apply the CHG Soap to your body ONLY FROM THE NECK DOWN.   Do not use on face/ open                           Wound or open sores. Avoid contact with eyes, ears mouth and genitals (private parts).                       Wash face,  Genitals (private parts) with your normal soap.             6.  Wash thoroughly, paying special attention to the area where your surgery  will be performed.  7.  Thoroughly rinse your body with warm water from the neck down.  8.  DO NOT shower/wash with your normal soap after using and rinsing off  the CHG Soap.                9.  Pat yourself dry with a clean towel.            10.  Wear clean pajamas.            11.  Place clean sheets on your bed the night of your first shower and do not  sleep with pets. Day of Surgery : Do not apply any lotions/deodorants the morning of surgery.  Please wear clean clothes to the hospital/surgery center.  FAILURE TO FOLLOW THESE INSTRUCTIONS MAY RESULT IN THE CANCELLATION OF YOUR SURGERY PATIENT SIGNATURE_________________________________  NURSE SIGNATURE__________________________________  ________________________________________________________________________

## 2014-11-18 ENCOUNTER — Encounter (HOSPITAL_COMMUNITY): Payer: Self-pay

## 2014-11-18 ENCOUNTER — Ambulatory Visit: Payer: Self-pay

## 2014-11-18 ENCOUNTER — Other Ambulatory Visit (HOSPITAL_COMMUNITY): Payer: Self-pay | Admitting: Dentistry

## 2014-11-18 ENCOUNTER — Encounter (HOSPITAL_COMMUNITY)
Admission: RE | Admit: 2014-11-18 | Discharge: 2014-11-18 | Disposition: A | Discharge status: Home / Self Care | Payer: Medicare PPO | Source: Ambulatory Visit | Attending: Dentistry | Admitting: Dentistry

## 2014-11-18 ENCOUNTER — Telehealth: Payer: Self-pay | Admitting: Hematology and Oncology

## 2014-11-18 ENCOUNTER — Other Ambulatory Visit: Payer: Self-pay | Admitting: Hematology and Oncology

## 2014-11-18 DIAGNOSIS — H3552 Pigmentary retinal dystrophy: Secondary | ICD-10-CM | POA: Diagnosis not present

## 2014-11-18 DIAGNOSIS — K029 Dental caries, unspecified: Secondary | ICD-10-CM | POA: Diagnosis not present

## 2014-11-18 DIAGNOSIS — Z791 Long term (current) use of non-steroidal anti-inflammatories (NSAID): Secondary | ICD-10-CM | POA: Diagnosis not present

## 2014-11-18 DIAGNOSIS — M1990 Unspecified osteoarthritis, unspecified site: Secondary | ICD-10-CM | POA: Diagnosis not present

## 2014-11-18 DIAGNOSIS — C01 Malignant neoplasm of base of tongue: Secondary | ICD-10-CM

## 2014-11-18 DIAGNOSIS — K0530 Chronic periodontitis, unspecified: Secondary | ICD-10-CM | POA: Diagnosis not present

## 2014-11-18 DIAGNOSIS — I10 Essential (primary) hypertension: Secondary | ICD-10-CM | POA: Diagnosis not present

## 2014-11-18 DIAGNOSIS — Z79899 Other long term (current) drug therapy: Secondary | ICD-10-CM | POA: Diagnosis not present

## 2014-11-18 DIAGNOSIS — Z7951 Long term (current) use of inhaled steroids: Secondary | ICD-10-CM | POA: Diagnosis not present

## 2014-11-18 DIAGNOSIS — K219 Gastro-esophageal reflux disease without esophagitis: Secondary | ICD-10-CM | POA: Diagnosis not present

## 2014-11-18 DIAGNOSIS — M278 Other specified diseases of jaws: Secondary | ICD-10-CM | POA: Diagnosis not present

## 2014-11-18 DIAGNOSIS — Z931 Gastrostomy status: Secondary | ICD-10-CM | POA: Diagnosis not present

## 2014-11-18 HISTORY — DX: Anxiety disorder, unspecified: F41.9

## 2014-11-18 HISTORY — DX: Major depressive disorder, single episode, unspecified: F32.9

## 2014-11-18 HISTORY — DX: Gastro-esophageal reflux disease without esophagitis: K21.9

## 2014-11-18 HISTORY — DX: Depression, unspecified: F32.A

## 2014-11-18 HISTORY — DX: Unspecified osteoarthritis, unspecified site: M19.90

## 2014-11-18 LAB — CBC
HCT: 37.7 % — ABNORMAL LOW (ref 39.0–52.0)
HEMOGLOBIN: 12 g/dL — AB (ref 13.0–17.0)
MCH: 26.3 pg (ref 26.0–34.0)
MCHC: 31.8 g/dL (ref 30.0–36.0)
MCV: 82.5 fL (ref 78.0–100.0)
PLATELETS: 352 10*3/uL (ref 150–400)
RBC: 4.57 MIL/uL (ref 4.22–5.81)
RDW: 13.7 % (ref 11.5–15.5)
WBC: 8.4 10*3/uL (ref 4.0–10.5)

## 2014-11-18 LAB — BASIC METABOLIC PANEL
ANION GAP: 7 (ref 5–15)
BUN: 26 mg/dL — AB (ref 6–20)
CO2: 26 mmol/L (ref 22–32)
Calcium: 9.2 mg/dL (ref 8.9–10.3)
Chloride: 101 mmol/L (ref 101–111)
Creatinine, Ser: 1.44 mg/dL — ABNORMAL HIGH (ref 0.61–1.24)
GFR, EST AFRICAN AMERICAN: 53 mL/min — AB (ref >60)
GFR, EST NON AFRICAN AMERICAN: 45 mL/min — AB (ref >60)
Glucose, Bld: 89 mg/dL (ref 65–99)
POTASSIUM: 5.3 mmol/L — AB (ref 3.5–5.1)
SODIUM: 134 mmol/L — AB (ref 135–145)

## 2014-11-18 NOTE — Anesthesia Preprocedure Evaluation (Addendum)
Anesthesia Evaluation  Patient identified by MRN, date of birth, ID band Patient awake    Reviewed: Allergy & Precautions, H&P , NPO status , Patient's Chart, lab work & pertinent test results  Airway Mallampati: III  TM Distance: >3 FB Neck ROM: Full    Dental no notable dental hx. (+) Poor Dentition, Dental Advisory Given   Pulmonary neg pulmonary ROS,    Pulmonary exam normal breath sounds clear to auscultation       Cardiovascular hypertension, Pt. on medications (-) Past MI, (-) Cardiac Stents and (-) CABG Normal cardiovascular exam Rhythm:regular Rate:Normal     Neuro/Psych Anxiety Depression negative neurological ROS  negative psych ROS   GI/Hepatic negative GI ROS, Neg liver ROS, GERD  Medicated and Controlled,  Endo/Other  negative endocrine ROS  Renal/GU negative Renal ROS  negative genitourinary   Musculoskeletal  (+) Arthritis ,   Abdominal   Peds  Hematology negative hematology ROS (+)   Anesthesia Other Findings B Judd: evaluated patient on Monday 10/18/14 while in preop clinic at [facility]. He denied any CAD, cardiac stents, heart failure, active cardiac symptoms or pulmonary history or symptoms. No previous problems with anesthesia. On physical Exam >3 FB mouth opening, unable to fully visualize any tongue mass or oropharyngeal obstruction, full neck extension, >5 FB thyromental distance, poor dentition.Marland Kitchen Potential need for glidescope and/or fiberoptic scope depending on location of mass with laryngoscopy  Reproductive/Obstetrics negative OB ROS                             Anesthesia Physical Anesthesia Plan  ASA: III  Anesthesia Plan: General   Post-op Pain Management:    Induction: Intravenous  Airway Management Planned: Video Laryngoscope Planned and Nasal ETT  Additional Equipment:   Intra-op Plan:   Post-operative Plan: Extubation in OR  Informed Consent: I  have reviewed the patients History and Physical, chart, labs and discussed the procedure including the risks, benefits and alternatives for the proposed anesthesia with the patient or authorized representative who has indicated his/her understanding and acceptance.   Dental Advisory Given  Plan Discussed with: CRNA  Anesthesia Plan Comments:         Anesthesia Quick Evaluation

## 2014-11-18 NOTE — Progress Notes (Signed)
Requested and received EKG done 10/23/2014 from [facility] on chart.

## 2014-11-18 NOTE — Progress Notes (Signed)
BMP results done 11/18/2014   faxed via EPIC to Dr Enrique Sack

## 2014-11-18 NOTE — Progress Notes (Signed)
Anesthesia consults done by Dr Veatrice Kells on 11/18/14.

## 2014-11-18 NOTE — Telephone Encounter (Signed)
lvm for pt regarding to OCT appt....mailed pt appt sched/avs adn letter °

## 2014-11-18 NOTE — Progress Notes (Signed)
CXR- 09/2014 in EPIC under Care Everywhere.

## 2014-11-18 NOTE — Progress Notes (Signed)
Patient upset today over having to wait a month to have surgery related to his cancer of the tongue.  Patient states " I am dying and I have had to wait a month before anything was done."  Emotional support given to patient.  Both patient and daughter allowed to ventilate.  Daughter states patient started out at the [facility] in [place] and she did not like the care there even though patient was satisfied with care there and wanted her father brought here for care.

## 2014-11-20 ENCOUNTER — Encounter: Payer: Self-pay | Admitting: Nutrition

## 2014-11-20 ENCOUNTER — Encounter (HOSPITAL_COMMUNITY): Payer: Self-pay | Admitting: *Deleted

## 2014-11-20 ENCOUNTER — Ambulatory Visit (HOSPITAL_COMMUNITY)
Admission: RE | Admit: 2014-11-20 | Discharge: 2014-11-20 | Disposition: A | Discharge status: Home / Self Care | Payer: Medicare PPO | Source: Ambulatory Visit | Attending: Dentistry | Admitting: Dentistry

## 2014-11-20 ENCOUNTER — Other Ambulatory Visit: Payer: Self-pay | Admitting: Hematology and Oncology

## 2014-11-20 ENCOUNTER — Ambulatory Visit (HOSPITAL_COMMUNITY): Payer: Medicare PPO | Admitting: Anesthesiology

## 2014-11-20 ENCOUNTER — Encounter (HOSPITAL_COMMUNITY)
Admission: RE | Disposition: A | Discharge status: Home / Self Care | Payer: Self-pay | Source: Ambulatory Visit | Attending: Dentistry

## 2014-11-20 DIAGNOSIS — K029 Dental caries, unspecified: Secondary | ICD-10-CM | POA: Diagnosis not present

## 2014-11-20 DIAGNOSIS — K0530 Chronic periodontitis, unspecified: Secondary | ICD-10-CM | POA: Insufficient documentation

## 2014-11-20 DIAGNOSIS — Z7951 Long term (current) use of inhaled steroids: Secondary | ICD-10-CM | POA: Insufficient documentation

## 2014-11-20 DIAGNOSIS — Z931 Gastrostomy status: Secondary | ICD-10-CM | POA: Insufficient documentation

## 2014-11-20 DIAGNOSIS — C01 Malignant neoplasm of base of tongue: Secondary | ICD-10-CM

## 2014-11-20 DIAGNOSIS — K036 Deposits [accretions] on teeth: Secondary | ICD-10-CM

## 2014-11-20 DIAGNOSIS — Z791 Long term (current) use of non-steroidal anti-inflammatories (NSAID): Secondary | ICD-10-CM | POA: Insufficient documentation

## 2014-11-20 DIAGNOSIS — M278 Other specified diseases of jaws: Secondary | ICD-10-CM | POA: Diagnosis not present

## 2014-11-20 DIAGNOSIS — H3552 Pigmentary retinal dystrophy: Secondary | ICD-10-CM | POA: Insufficient documentation

## 2014-11-20 DIAGNOSIS — I10 Essential (primary) hypertension: Secondary | ICD-10-CM | POA: Insufficient documentation

## 2014-11-20 DIAGNOSIS — K219 Gastro-esophageal reflux disease without esophagitis: Secondary | ICD-10-CM | POA: Insufficient documentation

## 2014-11-20 DIAGNOSIS — Z79899 Other long term (current) drug therapy: Secondary | ICD-10-CM | POA: Insufficient documentation

## 2014-11-20 DIAGNOSIS — M1990 Unspecified osteoarthritis, unspecified site: Secondary | ICD-10-CM | POA: Insufficient documentation

## 2014-11-20 HISTORY — PX: MULTIPLE EXTRACTIONS WITH ALVEOLOPLASTY: SHX5342

## 2014-11-20 SURGERY — MULTIPLE EXTRACTION WITH ALVEOLOPLASTY
Anesthesia: General

## 2014-11-20 MED ORDER — ISOPROPYL ALCOHOL 70 % SOLN
Status: AC
  Filled 2014-11-20: qty 480

## 2014-11-20 MED ORDER — PHENYLEPHRINE HCL 10 MG/ML IJ SOLN
INTRAMUSCULAR | Status: DC | PRN
  Administered 2014-11-20 (×11): 80 ug via INTRAVENOUS

## 2014-11-20 MED ORDER — HYDROCODONE-ACETAMINOPHEN 7.5-325 MG/15ML PO SOLN
10.0000 mL | Freq: Four times a day (QID) | ORAL | Status: DC | PRN
  Administered 2014-11-20: 15 mL via ORAL
  Filled 2014-11-20: qty 15

## 2014-11-20 MED ORDER — LACTATED RINGERS IV SOLN: INTRAVENOUS | Status: DC

## 2014-11-20 MED ORDER — BACITRACIN ZINC 500 UNIT/GM EX OINT
TOPICAL_OINTMENT | CUTANEOUS | Status: AC
  Filled 2014-11-20: qty 28.35

## 2014-11-20 MED ORDER — ONDANSETRON HCL 4 MG/2ML IJ SOLN
INTRAMUSCULAR | Status: AC
  Filled 2014-11-20: qty 2

## 2014-11-20 MED ORDER — ONDANSETRON HCL 4 MG/2ML IJ SOLN
INTRAMUSCULAR | Status: DC | PRN
  Administered 2014-11-20: 4 mg via INTRAVENOUS

## 2014-11-20 MED ORDER — SODIUM CHLORIDE 0.9 % IJ SOLN
INTRAMUSCULAR | Status: AC
  Filled 2014-11-20: qty 10

## 2014-11-20 MED ORDER — EPHEDRINE SULFATE 50 MG/ML IJ SOLN
INTRAMUSCULAR | Status: AC
  Filled 2014-11-20: qty 1

## 2014-11-20 MED ORDER — FENTANYL CITRATE (PF) 250 MCG/5ML IJ SOLN
INTRAMUSCULAR | Status: AC
  Filled 2014-11-20: qty 25

## 2014-11-20 MED ORDER — SUCCINYLCHOLINE CHLORIDE 20 MG/ML IJ SOLN
INTRAMUSCULAR | Status: DC | PRN
  Administered 2014-11-20: 100 mg via INTRAVENOUS

## 2014-11-20 MED ORDER — DEXAMETHASONE SODIUM PHOSPHATE 10 MG/ML IJ SOLN
INTRAMUSCULAR | Status: DC | PRN
  Administered 2014-11-20: 10 mg via INTRAVENOUS

## 2014-11-20 MED ORDER — PHENYLEPHRINE 40 MCG/ML (10ML) SYRINGE FOR IV PUSH (FOR BLOOD PRESSURE SUPPORT)
PREFILLED_SYRINGE | INTRAVENOUS | Status: AC
  Filled 2014-11-20: qty 20

## 2014-11-20 MED ORDER — FENTANYL CITRATE (PF) 100 MCG/2ML IJ SOLN: 25.0000 ug | INTRAMUSCULAR | Status: DC | PRN

## 2014-11-20 MED ORDER — MIDAZOLAM HCL 5 MG/5ML IJ SOLN
INTRAMUSCULAR | Status: DC | PRN
  Administered 2014-11-20 (×2): 1 mg via INTRAVENOUS

## 2014-11-20 MED ORDER — BUPIVACAINE-EPINEPHRINE (PF) 0.5% -1:200000 IJ SOLN
INTRAMUSCULAR | Status: AC
  Filled 2014-11-20: qty 10.8

## 2014-11-20 MED ORDER — PHENYLEPHRINE 40 MCG/ML (10ML) SYRINGE FOR IV PUSH (FOR BLOOD PRESSURE SUPPORT)
PREFILLED_SYRINGE | INTRAVENOUS | Status: AC
  Filled 2014-11-20: qty 10

## 2014-11-20 MED ORDER — MIDAZOLAM HCL 2 MG/2ML IJ SOLN
INTRAMUSCULAR | Status: AC
  Filled 2014-11-20: qty 4

## 2014-11-20 MED ORDER — BUPIVACAINE-EPINEPHRINE (PF) 0.5% -1:200000 IJ SOLN
INTRAMUSCULAR | Status: DC | PRN
  Administered 2014-11-20: 1.8 mL
  Administered 2014-11-20: 3.6 mL

## 2014-11-20 MED ORDER — EPHEDRINE SULFATE 50 MG/ML IJ SOLN
INTRAMUSCULAR | Status: DC | PRN
  Administered 2014-11-20: 5 mg via INTRAVENOUS

## 2014-11-20 MED ORDER — GLYCOPYRROLATE 0.2 MG/ML IJ SOLN
INTRAMUSCULAR | Status: AC
  Filled 2014-11-20: qty 3

## 2014-11-20 MED ORDER — 0.9 % SODIUM CHLORIDE (POUR BTL) OPTIME
TOPICAL | Status: DC | PRN
  Administered 2014-11-20: 1000 mL

## 2014-11-20 MED ORDER — ISOPROPYL ALCOHOL 70 % SOLN
Status: DC | PRN
  Administered 2014-11-20: 1 via TOPICAL

## 2014-11-20 MED ORDER — PROPOFOL 10 MG/ML IV BOLUS
INTRAVENOUS | Status: AC
  Filled 2014-11-20: qty 20

## 2014-11-20 MED ORDER — HYDROCODONE-ACETAMINOPHEN 7.5-325 MG/15ML PO SOLN: 15.0000 mL | Freq: Four times a day (QID) | ORAL | Status: DC | PRN

## 2014-11-20 MED ORDER — LIDOCAINE HCL (CARDIAC) 20 MG/ML IV SOLN
INTRAVENOUS | Status: AC
  Filled 2014-11-20: qty 5

## 2014-11-20 MED ORDER — CEFAZOLIN SODIUM-DEXTROSE 2-3 GM-% IV SOLR
2.0000 g | Freq: Once | INTRAVENOUS | Status: AC
  Administered 2014-11-20: 2 g via INTRAVENOUS

## 2014-11-20 MED ORDER — FENTANYL CITRATE (PF) 100 MCG/2ML IJ SOLN
INTRAMUSCULAR | Status: DC | PRN
  Administered 2014-11-20 (×5): 50 ug via INTRAVENOUS

## 2014-11-20 MED ORDER — PROPOFOL 10 MG/ML IV BOLUS
INTRAVENOUS | Status: DC | PRN
  Administered 2014-11-20: 140 mg via INTRAVENOUS

## 2014-11-20 MED ORDER — LIDOCAINE HCL (CARDIAC) 20 MG/ML IV SOLN
INTRAVENOUS | Status: DC | PRN
  Administered 2014-11-20: 100 mg via INTRAVENOUS

## 2014-11-20 MED ORDER — NEOSTIGMINE METHYLSULFATE 10 MG/10ML IV SOLN
INTRAVENOUS | Status: AC
  Filled 2014-11-20: qty 1

## 2014-11-20 MED ORDER — OXYMETAZOLINE HCL 0.05 % NA SOLN
NASAL | Status: AC
  Filled 2014-11-20: qty 15

## 2014-11-20 MED ORDER — DEXAMETHASONE SODIUM PHOSPHATE 10 MG/ML IJ SOLN
INTRAMUSCULAR | Status: AC
  Filled 2014-11-20: qty 1

## 2014-11-20 MED ORDER — LACTATED RINGERS IV SOLN
INTRAVENOUS | Status: DC | PRN
  Administered 2014-11-20 (×2): via INTRAVENOUS

## 2014-11-20 MED ORDER — ARTIFICIAL TEARS OP OINT
TOPICAL_OINTMENT | OPHTHALMIC | Status: AC
  Filled 2014-11-20: qty 3.5

## 2014-11-20 MED ORDER — LIDOCAINE-EPINEPHRINE 2 %-1:100000 IJ SOLN
INTRAMUSCULAR | Status: DC | PRN
  Administered 2014-11-20: 1.7 mL
  Administered 2014-11-20: 7.2 mL
  Administered 2014-11-20: 1.7 mL

## 2014-11-20 MED ORDER — LIDOCAINE-EPINEPHRINE 2 %-1:100000 IJ SOLN
INTRAMUSCULAR | Status: AC
  Filled 2014-11-20: qty 8.5

## 2014-11-20 MED ORDER — ROCURONIUM BROMIDE 100 MG/10ML IV SOLN
INTRAVENOUS | Status: AC
  Filled 2014-11-20: qty 1

## 2014-11-20 MED ORDER — CEFAZOLIN SODIUM-DEXTROSE 2-3 GM-% IV SOLR
INTRAVENOUS | Status: AC
  Filled 2014-11-20: qty 50

## 2014-11-20 MED ORDER — LACTATED RINGERS IV SOLN
INTRAVENOUS | Status: DC
Start: 2014-11-20 — End: 2014-11-20

## 2014-11-20 SURGICAL SUPPLY — 28 items
ATTRACTOMAT 16X20 MAGNETIC DRP (DRAPES) ×3 IMPLANT
BAG ZIPLOCK 12X15 (MISCELLANEOUS) IMPLANT
BANDAGE EYE OVAL (MISCELLANEOUS) ×6 IMPLANT
BLADE SURG 15 STRL LF DISP TIS (BLADE) ×2 IMPLANT
BLADE SURG 15 STRL SS (BLADE) ×4
CANNULA VESSEL W/WING WO/VALVE (CANNULA) ×3 IMPLANT
GAUZE SPONGE 4X4 12PLY STRL (GAUZE/BANDAGES/DRESSINGS) ×3 IMPLANT
GAUZE SPONGE 4X4 16PLY XRAY LF (GAUZE/BANDAGES/DRESSINGS) ×6 IMPLANT
GLOVE BIOGEL PI IND STRL 6 (GLOVE) ×1 IMPLANT
GLOVE BIOGEL PI INDICATOR 6 (GLOVE) ×2
GLOVE SURG ORTHO 8.0 STRL STRW (GLOVE) ×3 IMPLANT
GLOVE SURG SS PI 6.0 STRL IVOR (GLOVE) ×3 IMPLANT
GOWN STRL REUS W/TWL 2XL LVL3 (GOWN DISPOSABLE) ×3 IMPLANT
GOWN STRL REUS W/TWL LRG LVL3 (GOWN DISPOSABLE) ×3 IMPLANT
KIT BASIN OR (CUSTOM PROCEDURE TRAY) ×3 IMPLANT
NS IRRIG 1000ML POUR BTL (IV SOLUTION) ×6 IMPLANT
PACK EENT SPLIT (PACKS) ×3 IMPLANT
PACKING VAGINAL (PACKING) ×3 IMPLANT
SPONGE SURGIFOAM ABS GEL 100 (HEMOSTASIS) ×3 IMPLANT
SUCTION FRAZIER 12FR DISP (SUCTIONS) ×3 IMPLANT
SUT CHROMIC 3 0 PS 2 (SUTURE) ×12 IMPLANT
SUT CHROMIC 4 0 P 3 18 (SUTURE) IMPLANT
SYR 50ML LL SCALE MARK (SYRINGE) ×3 IMPLANT
TOWEL OR 17X26 10 PK STRL BLUE (TOWEL DISPOSABLE) ×6 IMPLANT
TUBING CONNECTING 10 (TUBING) ×2 IMPLANT
TUBING CONNECTING 10' (TUBING) ×1
WATER STERILE IRR 1500ML POUR (IV SOLUTION) IMPLANT
YANKAUER SUCT BULB TIP NO VENT (SUCTIONS) ×3 IMPLANT

## 2014-11-20 NOTE — Discharge Instructions (Signed)

## 2014-11-20 NOTE — H&P (Signed)
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 Patient with anticipated chemoradiation therapy. Patient was seen as part of a pre-chemoradiation therapy dental protocol. Patient now presents for multiple extractions with alveoloplasty and full mouth debridement of remaining dentition the operating room with general anesthesia. Patient denies any acute medical or dental changes. Please see Dr. Alvy Bimler note of 11/08/2014 and use as H&P for the dental operating room procedure.  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 Bobier 79 y.o. male is here because of newly diagnosed Base of tongue cancer. I review his records extensively and collaborated the history with the patient. Summary of oncologic history as follows: Oncology History   Cancer of base of tongue  Staging form: Lip and Oral Cavity, AJCC 7th Edition  Clinical stage from 11/08/2014: Stage IVA (T4a, N2c, M0) - Signed by Heath Lark, MD on 11/08/2014       Cancer of base of tongue   07/08/2014 Miscellaneous the patient noted tongue swelling / mass   10/04/2014 Imaging CT scan done at the [facility] show large tongue mass with bilateral neck lymphadenopathy   10/07/2014 Miscellaneous he was seen at the [facility] for evaluation was noted to have tongue cancer   10/10/2014 Imaging PET CT scan from the [facility] showed extensive regional lymphadenopathy on the left side including supraclavicular lymph node  involvement and possibly right jugulodigastric lymph node involvement.   10/24/2014 Procedure he underwent laryngoscopy and biopsy. there is a large ulcerative tongue a mass on the left involving the tonsil, soft palate, epiglottis.   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 He has difficulties with chewing food, mild swallowing difficulties, mild painful swallowing, and 45 pounds abnormal weight loss. He also complained of intermittent hemoptysis. He has slurring as patient to do the tongue mass. Recently, he had a fall due to his tunnel vision from retinitis pigmentosa. He denies balance difficulties. He has a feeding tube placed but has not started using it yet. He rated his throat pain at 3 out of 10 and he uses Aleve as needed for pain. He complained of fatigue and anorexia  MEDICAL HISTORY:  Past Medical History  Diagnosis Date  . Tongue cancer   . Hypertension   . Retinitis pigmentosa of both eyes     SURGICAL HISTORY: Past Surgical History  Procedure Laterality Date  . Biopsy tongue  10/24/14  . Knee surgery Right   . Tonsilectomy, adenoidectomy, bilateral myringotomy and tubes      SOCIAL HISTORY: Social History   Social History  . Marital Status: Unknown    Spouse Name: N/A  . Number of Children: 2  . Years of Education: N/A  Occupational History  . Acupuncturist    Social History Main Topics  . Smoking status: Never Smoker   . Smokeless tobacco: Never Used  . Alcohol Use: 0.0 oz/week    0 Standard drinks or equivalent per week     Comment: 1 drink per month  . Drug Use: Not on file  . Sexual Activity: Not on file   Other Topics Concern  . Not on file   Social History Narrative    FAMILY HISTORY: Family History   Problem Relation Age of Onset  . Ovarian cancer Sister   . Heart attack Father   . Breast cancer      neice on Sister's side. Also had tongue cancer.    ALLERGIES: has No Known Allergies.  MEDICATIONS:  Current Outpatient Prescriptions  Medication Sig Dispense Refill  . cetirizine (ZYRTEC) 10 MG tablet Take 10 mg by mouth.    . fluticasone (FLONASE) 50 MCG/ACT nasal spray Place 1 spray into the nose.    . Menthol, Topical Analgesic, 16 % LIQD     . metoprolol (LOPRESSOR) 50 MG tablet Take 25 mg by mouth 2 (two) times daily.     . naproxen sodium (RA NAPROXEN SODIUM) 220 MG tablet Take 220 mg by mouth.    . Nutritional Supplements (FEEDING SUPPLEMENT, OSMOLITE 1.5 CAL,) LIQD 240 mLs by Nasogastric route.    Marland Kitchen omeprazole (PRILOSEC) 20 MG capsule Take 20 mg by mouth.    . sodium chloride (V-R NASAL SPRAY SALINE) 0.65 % nasal spray Place 1 spray into the nose.    . zolpidem (AMBIEN) 10 MG tablet Take 5 mg by mouth.     No current facility-administered medications for this visit.    REVIEW OF SYSTEMS:  Constitutional: Denies fevers, chills or abnormal night sweats Eyes: Denies blurriness of vision, double vision or watery eyes Respiratory: Denies cough, dyspnea or wheezes Cardiovascular: Denies palpitation, chest discomfort or lower extremity swelling Gastrointestinal: Denies nausea, heartburn or change in bowel habits Skin: Denies abnormal skin rashes Lymphatics: Denies new lymphadenopathy or easy bruising Neurological:Denies numbness, tingling or new weaknesses Behavioral/Psych: Mood is stable, no new changes  All other systems were reviewed with the patient and are negative.  PHYSICAL EXAMINATION: ECOG PERFORMANCE STATUS: 1 - Symptomatic but completely ambulatory  Filed Vitals:   11/08/14 1427  BP: 123/68  Pulse: 65  Temp: 98.2 F (36.8 C)  Resp: 18   Filed Weights   11/08/14  1427  Weight: 169 lb 12.8 oz (77.021 kg)    GENERAL:alert, no distress and comfortable. He looks elderly SKIN: skin color, texture, turgor are normal, no rashes or significant lesions EYES: normal, conjunctiva are pink and non-injected, sclera clear OROPHARYNX:noted tongue deviation and mass at the base of the tongue with mild abnormality in the left tonsillar region NECK: supple, thyroid normal size, non-tender, without nodularity LYMPH: Palpable cervical lymphadenopathy in the neck LUNGS: clear to auscultation and percussion with normal breathing effort HEART: regular rate & rhythm and no murmurs and no lower extremity edema ABDOMEN:abdomen soft, non-tender and normal bowel sounds. Feeding tube site looks okay Musculoskeletal:no cyanosis of digits and no clubbing  PSYCH: alert & oriented x 3 with fluent speech NEURO: no focal motor/sensory deficits  LABORATORY DATA:  I have reviewed the data as listed RADIOGRAPHIC STUDIES: I have personally reviewed the radiological images as listed and agreed with the findings in the report.  ASSESSMENT:  Newly diagnosed squamous cell carcinoma of the Head & Neck, HPV Positive  PLAN:  Cancer of base of tongue We have extensive discussion. The patient nderstands the importance of multidisciplinary approach. I think the patient would benefit from concurrent chemoradiation treatment. However, with his bilateral hearing loss at baseline, I would not recommend the use of cisplatin. The patient would be a candidate for cetuximab. He would need port placement and chemotherapy education class. I would hold off scheduling those until he has full dental evaluation. I will see him back prior to start of treatment. We discussed briefly the benefit of addition of concurrent systemic treatment. He agreed with the plan of care.  Weight loss This is related to cancer. Dr. Isidore Moos had put in requested for him to see dietitian. I reinforced the  importance of adequate calorie intake and recommend he take nutritional supplements as tolerated  Hemoptysis He had intermittent hemoptysis related to the ulcerated mass. We will observe for now.  Dysphagia This is related to the ulcerated mass. He will be referred to speech and language therapist for assessment and formal swallow evaluation    All questions were answered. The patient knows to call the clinic with any problems, questions or concerns. I spent 55 minutes counseling the patient face to face. The total time spent in the appointment was 60 minutes and more than 50% was on counseling.   [facility], [place], MD 11/08/2014 3:54 PM

## 2014-11-20 NOTE — Anesthesia Postprocedure Evaluation (Signed)
  Anesthesia Post-op Note  Patient: Kenneth Jordan  Procedure(s) Performed: Procedure(s) (LRB): Extraction of tooth #'s 1,15,16,17,20,31 with alveoloplasty and gross debridement of remaining teeth (N/A)  Patient Location: PACU  Anesthesia Type: General  Level of Consciousness: awake and alert   Airway and Oxygen Therapy: Patient Spontanous Breathing  Post-op Pain: mild  Post-op Assessment: Post-op Vital signs reviewed, Patient's Cardiovascular Status Stable, Respiratory Function Stable, Patent Airway and No signs of Nausea or vomiting  Last Vitals:  Filed Vitals:   11/20/14 1145  BP: 142/68  Pulse: 84  Temp:   Resp: 19    Post-op Vital Signs: stable   Complications: No apparent anesthesia complications

## 2014-11-20 NOTE — H&P (Signed)
I have reviewed the H and P and have updated it in my pre op note.  The patient is ready for surgery.  [place] MD

## 2014-11-20 NOTE — Progress Notes (Signed)
PRE-OPERATIVE NOTE:  11/20/2014   Kenneth Jordan [id number]  VITALS: BP 136/78 mmHg  Pulse 97  Temp(Src) 98.3 F (36.8 C) (Oral)  Resp 16  Ht 5\' 9"  (1.753 m)  Wt 170 lb (77.111 kg)  BMI 25.09 kg/m2  SpO2 100%  Lab Results  Component Value Date   WBC 8.4 11/18/2014   HGB 12.0* 11/18/2014   HCT 37.7* 11/18/2014   MCV 82.5 11/18/2014   PLT 352 11/18/2014   BMET    Component Value Date/Time   NA 134* 11/18/2014 1100   K 5.3* 11/18/2014 1100   CL 101 11/18/2014 1100   CO2 26 11/18/2014 1100   GLUCOSE 89 11/18/2014 1100   BUN 26* 11/18/2014 1100   CREATININE 1.44* 11/18/2014 1100   CALCIUM 9.2 11/18/2014 1100   GFRNONAA 45* 11/18/2014 1100   GFRAA 53* 11/18/2014 1100    No results found for: INR, PROTIME No results found for: PTT   Kenneth Jordan presents for multiple dental extractions with alveoloplasty and gross debridement of remaining dentition the operative room and general anesthesia.   SUBJECTIVE: The patient denies any acute medical or dental changes and agrees to proceed with treatment as planned.  EXAM: No sign of acute dental changes.  ASSESSMENT: Patient is affected by squamous cell carcinoma the base of tongue, pre-chemoradiation therapy, chronic periodontitis, dental caries, and accretions.  PLAN: Patient agrees to proceed with treatment as planned in the operating room as previously discussed and accepts the risks, benefits, and complications of the proposed treatment. Patient is aware of the risk for bleeding, bruising, swelling, infection, pain, nerve damage, soft tissue damage, damage to adjacent teeth, sinus involvement, root tip fracture, mandible fracture, and the risks of complications associated with the anesthesia. Patient also is aware of the potential for other complications not mentioned above.   Lenn Cal, DDS

## 2014-11-20 NOTE — Op Note (Signed)
OPERATIVE REPORT  Patient:            Kenneth Jordan Date of Birth:  July 30, 1935 MRN:                [id number]   DATE OF PROCEDURE:  11/20/2014  PREOPERATIVE DIAGNOSES: 1. Squamous cell carcinoma of the base of tongue 2. Pre-chemoradiation therapy dental protocol 3. Chronic periodontitis 4. Dental caries 5. Accretions  POSTOPERATIVE DIAGNOSES: 1. Squamous cell carcinoma of the base of tongue 2. Pre-chemoradiation therapy dental protocol 3. Chronic periodontitis 4. Dental caries 5. Accretions  OPERATIONS: 1. Multiple extraction of tooth numbers 1, 15, 16, 17, 20, and 31 2. 4 Quadrants of alveoloplasty 3. Gross debridement of remaining dentition   SURGEON: Lenn Cal, DDS  ASSISTANT: Camie Patience, (dental assistant)  ANESTHESIA: General anesthesia via nasoendotracheal tube.  MEDICATIONS: 1. Ancef 2 g IV prior to invasive dental procedures. 2. Local anesthesia with a total utilization of 5 carpules each containing 34 mg of lidocaine with 0.017 mg of epinephrine as well as 3 carpules each containing 9 mg of bupivacaine with 0.009 mg of epinephrine.  SPECIMENS: There are 6 teeth that were discarded.  DRAINS: None  CULTURES: None  COMPLICATIONS: None   ESTIMATED BLOOD LOSS: 50 mLs.  INTRAVENOUS FLUIDS: 1400 mLs of Lactated ringers solution.  INDICATIONS: The patient was recently diagnosed with squamous cell carcinoma of the left base of tongue.  A medically necessary dental consultation was then requested to evaluate poor dentition prior to anticipated chemoradiation therapy.  The patient was examined and treatment planned for multiple extractions with alveoloplasty and gross debridement of remaining dentition in the operating room with general anesthesia.  This treatment plan was formulated to decrease the risks and complications associated with dental infection from affecting the patient's systemic health and to prevent future complications such as  osteoradionecrosis.  OPERATIVE FINDINGS: Patient was examined operating room number 5.  The teeth were identified for extraction. The patient was noted be affected by chronic periodontitis, accretions, and dental caries.   DESCRIPTION OF PROCEDURE: Patient was brought to the main operating room number 5. Patient was then placed in the supine position on the operating table. General anesthesia was then induced per the anesthesia team. The patient was then prepped and draped in the usual manner for dental medicine procedure. A timeout was performed. The patient was identified and procedures were verified. A throat pack was placed at this time. The oral cavity was then thoroughly examined with the findings noted above. The patient was then ready for dental medicine procedure as follows:  Local anesthesia was then administered sequentially with a total utilization of 5 carpules each containing 34 mg of lidocaine with 0.017 mg of epinephrine as well as 3 carpules  each containing 9 mg bupivacaine with 0.009 mg of epinephrine.  The Maxillary left and right quadrants first approached. Anesthesia was then delivered utilizing infiltration with lidocaine with epinephrine. A #15 blade incision was then made from the maxillary right tuberosity and extended to the mesial of #2.  A  surgical flap was then carefully reflected.  Tooth #1 was then subluxated and removed with a 150 forceps without complications. The flaps were further reflected on the palatal aspect to expose exostoses. These exostoses were then removed with a rongeurs and bone file. Further alveoloplasty was then performed utilizing a rongeur and bone file.  The surgical site was then irrigated with copious amounts sterile saline. The tissues were approximated and trimmed appropriately to obtain primary  closure. A piece of Surgifoam was then placed in the extraction socket. The surgical site was then closed from the maxillary right tuberosity and extended  the distal of  #2 utilizing 3-0 chromic gut suture in a continuous interrupted suture technique 1.  The maxillary left quadrant was then approached. A 15 blade incision was then made from the maxillary left tuberosity and extended to the distal of #11.  A surgical flap was then carefully reflected. Tooth numbers 15 and 16 were then subluxated with a series of straight elevators. Tooth numbers 15 and 16 were then removed with a 53L forceps without complications. Significant alveoloplasty was then performed utilizing a rongeur and bone file. Palatal exostoses were then removed with a rongeur and bone file. The surgical site was then irrigated with copious amounts of sterile saline. The tissues were approximated and trimmed appropriately to allow for primary closure. A piece of Surgifoam was then placed in the extraction sockets appropriately. The maxillary left surgical site was then closed from the maxillary left tuberosity and extended to the distal #11 utilizing 3-0 chromic gut suture in a continuous interrupted suture technique 1.  At this point time, the mandibular quadrants were approached. The patient was given bilateral inferior alveolar nerve blocks and long buccal nerve blocks utilizing the bupivacaine with epinephrine. Further infiltration was then achieved utilizing the lidocaine with epinephrine. A 15 blade incision was then made from the distal of number 17 and extended to the mesial of #21.  A surgical flap was then carefully reflected. Tooth #20 was then subluxated with a series of straight elevators and removed with a 151 forceps without complications. Appropriate amounts of buccal and interseptal bone were then removed with a surgical handpiece and bur and copious amounts sterile water around tooth #17. Tooth #17 was then removed with a 23 forceps without complications. Alveoloplasty was then performed utilizing a rongeurs and bone file. The surgical site was then irrigated with copious amounts  sterile saline. A piece of Surgifoam was then placed in the extraction sockets appropriately. The mandibular left surgical site was then closed from the distal of #17 and extended to the distal of #21 utilizing 3-0 chromic gut suture in a continuous interrupted suture technique 1. An interproximal suture utilizing 3-0 chromic gut material was placed between tooth numbers 21 and 22 to further close the surgical site.  The mandibular right surgical site was then approached. A 15 blade incision was made from the distal of #32 and extended to the distal of #28.  A surgical flap was then carefully reflected. Appropriate amounts of buccal and interseptal bone were then removed around tooth #31 utilizing a surgical handpiece and copious amount of sterile water. Tooth number #31 was then subluxated with a series of straight elevators and removed without complication with a 23 forceps.  Alveoloplasty was then performed utilizing a rongeurs and bone file from the distal of #32 to the distal of #28 as needed. The tissues were approximated and trimmed appropriately. The surgical sites were then irrigated with copious amounts of sterile saline. A piece of Surgifoam was then placed in the extraction socket #31. The mandibular right surgical site was then closed from the distal of  #32 and extended to the distal of #28 utilizing 3-0 chromic gut suture in a continuous interrupted suture technique 1.  At this point time the remaining dentition was approached. A sonic scaler was used to remove accretions. A series of hand curettes were then used to further remove accretions. A  sonic scaler was then again used to further refine removal of accretions as needed.  At this point time, the entire mouth was irrigated with copious amounts of sterile saline. The patient was examined for complications, seeing none, the dental medicine procedure was deemed to be complete. The throat pack was removed at this time. An oral airway was then  placed at the request of the anesthesia team. A series of 4 x 4 gauze were placed in the mouth to aid hemostasis. The patient was then handed over to the anesthesia team for final disposition. After an appropriate amount of time, the patient was extubated and taken to the postanesthsia care unit in good condition. All counts were correct for the dental medicine procedure. Patient will be seen in approximately one week for evaluation for suture removal and to proceed with dental restorations of tooth numbers 5, 7, and 8 as time and space permits. The patient is to start chemoradiation therapy on 12/04/2014 barring any complications with healing.   Lenn Cal, DDS.

## 2014-11-20 NOTE — Transfer of Care (Signed)
Immediate Anesthesia Transfer of Care Note  Patient: Kenneth Jordan  Procedure(s) Performed: Procedure(s): Extraction of tooth #'s 1,15,16,17,20,31 with alveoloplasty and gross debridement of remaining teeth (N/A)  Patient Location: PACU  Anesthesia Type:General  Level of Consciousness:  sedated, patient cooperative and responds to stimulation  Airway & Oxygen Therapy:Patient Spontanous Breathing and Patient connected to face mask oxgen  Post-op Assessment:  Report given to PACU RN and Post -op Vital signs reviewed and stable  Post vital signs:  Reviewed and stable  Last Vitals:  Filed Vitals:   11/20/14 0603  BP: 136/78  Pulse: 97  Temp: 36.8 C  Resp: 16    Complications: No apparent anesthesia complications

## 2014-11-20 NOTE — Anesthesia Procedure Notes (Addendum)
Procedure Name: Intubation Date/Time: 11/20/2014 9:00 AM Performed by: Montel Clock Pre-anesthesia Checklist: Patient identified, Emergency Drugs available, Suction available, Patient being monitored and Timeout performed Patient Re-evaluated:Patient Re-evaluated prior to inductionOxygen Delivery Method: Circle system utilized Preoxygenation: Pre-oxygenation with 100% oxygen Intubation Type: IV induction Ventilation: Mask ventilation without difficulty Laryngoscope Size: Miller and 2 Grade View: Grade II Tube type: Oral Nasal Tubes: Left, Nasal prep performed and Nasal Rae Tube size: 7.5 mm Number of attempts: 2 Placement Confirmation: ETT inserted through vocal cords under direct vision,  positive ETCO2 and breath sounds checked- equal and bilateral Secured at: 28 cm Tube secured with: Tape Dental Injury: Teeth and Oropharynx as per pre-operative assessment  Difficulty Due To: Difficulty was anticipated Future Recommendations: Recommend- induction with short-acting agent, and alternative techniques readily available Comments: Large tumor L base of tongue. Glidescope and fiberoptic available. Both nares sprayed with Afrin. 6,7,8 lubricated nasal airways inserted. Warmed 7.5 nasal RAE ETT inserted into L nare. Attempt x1 MAC 3, view of upper left quadrant obscured by large tongue tumor, able to identify epiglottis but unable to identify structures sufficiently to verify if opening seen was esophageal or glottic, appeared to be esophagus. Attempt x2 Sabra Heck 2, Grade 2 view partially obstructed by tumor mass (upper left), able to identify cords sufficiently to pass nasal RAE through cords without Magills using downward laryngeal pressure to maintain view. Placement verified.

## 2014-11-22 ENCOUNTER — Ambulatory Visit: Payer: Non-veteran care | Admitting: Radiation Oncology

## 2014-11-25 ENCOUNTER — Ambulatory Visit: Payer: Medicare PPO | Admitting: Nutrition

## 2014-11-25 ENCOUNTER — Ambulatory Visit: Payer: Medicare PPO | Attending: Radiation Oncology

## 2014-11-25 ENCOUNTER — Ambulatory Visit: Payer: Medicare PPO

## 2014-11-25 ENCOUNTER — Ambulatory Visit
Admission: RE | Admit: 2014-11-25 | Discharge: 2014-11-25 | Disposition: A | Discharge status: Home / Self Care | Payer: Non-veteran care | Source: Ambulatory Visit | Attending: Radiation Oncology | Admitting: Radiation Oncology

## 2014-11-25 ENCOUNTER — Encounter: Payer: Self-pay | Admitting: *Deleted

## 2014-11-25 ENCOUNTER — Ambulatory Visit (HOSPITAL_BASED_OUTPATIENT_CLINIC_OR_DEPARTMENT_OTHER)
Admission: RE | Admit: 2014-11-25 | Discharge: 2014-11-25 | Disposition: A | Discharge status: Home / Self Care | Payer: Non-veteran care | Source: Ambulatory Visit | Attending: Radiation Oncology | Admitting: Radiation Oncology

## 2014-11-25 DIAGNOSIS — R1310 Dysphagia, unspecified: Secondary | ICD-10-CM | POA: Diagnosis not present

## 2014-11-25 DIAGNOSIS — C01 Malignant neoplasm of base of tongue: Secondary | ICD-10-CM | POA: Diagnosis not present

## 2014-11-25 DIAGNOSIS — Z510 Encounter for antineoplastic radiation therapy: Secondary | ICD-10-CM | POA: Diagnosis not present

## 2014-11-25 DIAGNOSIS — R634 Abnormal weight loss: Secondary | ICD-10-CM | POA: Diagnosis not present

## 2014-11-25 LAB — COMPREHENSIVE METABOLIC PANEL (CC13)
AST: 12 U/L (ref 5–34)
Albumin: 3.4 g/dL — ABNORMAL LOW (ref 3.5–5.0)
Alkaline Phosphatase: 63 U/L (ref 40–150)
Anion Gap: 10 mEq/L (ref 3–11)
BUN: 20.1 mg/dL (ref 7.0–26.0)
CHLORIDE: 104 meq/L (ref 98–109)
CO2: 26 meq/L (ref 22–29)
CREATININE: 1.2 mg/dL (ref 0.7–1.3)
Calcium: 9.8 mg/dL (ref 8.4–10.4)
EGFR: 56 mL/min/{1.73_m2} — ABNORMAL LOW (ref >90)
GLUCOSE: 87 mg/dL (ref 70–140)
POTASSIUM: 4.4 meq/L (ref 3.5–5.1)
SODIUM: 140 meq/L (ref 136–145)
Total Bilirubin: 0.39 mg/dL (ref 0.20–1.20)
Total Protein: 7.1 g/dL (ref 6.4–8.3)

## 2014-11-25 LAB — CBC WITH DIFFERENTIAL/PLATELET
BASO%: 0.8 % (ref 0.0–2.0)
BASOS ABS: 0.1 10*3/uL (ref 0.0–0.1)
EOS%: 2.7 % (ref 0.0–7.0)
Eosinophils Absolute: 0.3 10*3/uL (ref 0.0–0.5)
HEMATOCRIT: 38.1 % — AB (ref 38.4–49.9)
HGB: 12.4 g/dL — ABNORMAL LOW (ref 13.0–17.1)
LYMPH#: 1.8 10*3/uL (ref 0.9–3.3)
LYMPH%: 18.7 % (ref 14.0–49.0)
MCH: 26 pg — AB (ref 27.2–33.4)
MCHC: 32.6 g/dL (ref 32.0–36.0)
MCV: 79.8 fL (ref 79.3–98.0)
MONO#: 0.8 10*3/uL (ref 0.1–0.9)
MONO%: 8.3 % (ref 0.0–14.0)
NEUT#: 6.9 10*3/uL — ABNORMAL HIGH (ref 1.5–6.5)
NEUT%: 69.5 % (ref 39.0–75.0)
Platelets: 360 10*3/uL (ref 140–400)
RBC: 4.78 10*6/uL (ref 4.20–5.82)
RDW: 14.9 % — AB (ref 11.0–14.6)
WBC: 9.8 10*3/uL (ref 4.0–10.3)

## 2014-11-25 LAB — TSH CHCC: TSH: 0.669 m[IU]/L (ref 0.320–4.118)

## 2014-11-25 NOTE — Progress Notes (Signed)
79 year old male diagnosed with tongue cancer.  He is a patient of Dr. Alvy Bimler to receive concurrent chemotherapy (cetuximab) and radiation treatments.  Past medical history includes hypertension, anxiety, depression, GERD, baseline hearing loss and feeding tube.  Medications include Prilosec.  Labs include albumin 3.4 on September 26.  Height: 5 feet 9 inches. Weight: 170 pounds September 21. Usual body weight 215 pounds. BMI: 25.09.  Estimated nutrition needs: 2200-2400 calories, 110-125 grams protein, 2.4 L fluid.  Patient endorses 45 pound weight loss over last year. He has had multiple dental extractions on September 21. Patient reports mouth sores which are slowly healing.  However, limit oral intake. Patient states he has not had a bowel movement in over one week.  He appears to be taking Senokot, MiraLAX, randomly but not regularly. Patient also reports when home health nurse came to the home to flush the feeding tube, the feeding tube was clogged. Patient and daughter state they were never told to flush the feeding tube daily after it was placed at [facility]. Patient consuming 2 Ensure Plus a day along with yogurt and sips of Coca-Cola.  Nutrition diagnosis: Inadequate oral intake related to new diagnosis of tongue cancer as evidenced by 21% weight loss over one year.  Intervention:  Educated patient to rinse his mouth with baking soda and salt water and provided recipe. Educated patient on strategies for increasing oral intake, consuming small frequent meals and snacks. Provided fact sheets on soft moist protein foods. Recommended patient consume 4 to 5 bottles of Ensure Plus daily along with 3 meals of soft foods. Spoke with M.D. regarding recommendations for constipation.  Patient is to take MiraLAX on a daily basis until he has a bowel movement. M.D. will evaluate patient at next visit. I have left a message with nurse navigator regarding clogged feeding tube.  Requesting he  contact patient.  Monitoring, evaluation, goals:  Patient will work to increase calories and protein consuming soft moist foods and increase Ensure Plus to 4-5 bottles daily.  Next visit: Wednesday, October 5, during infusion.  **Disclaimer: This note was dictated with voice recognition software. Similar sounding words can inadvertently be transcribed and this note may contain transcription errors which may not have been corrected upon publication of note.**

## 2014-11-25 NOTE — Progress Notes (Signed)
  Oncology Nurse Navigator Documentation   Navigator Encounter Type: Clinic/MDC (11/25/14 1105) Patient Visit Type: VIFXGX (11/25/14 1105) Treatment Phase: CT SIM (11/25/14 1105) Barriers/Navigation Needs: Education (11/25/14 1105)     To provide support and encouragement, care continuity and to assess for needs, met with patient during his CT SIM.  He was accompanied by his dtr. He tolerated the procedure wo/ difficulty. I answered dtr's questions re onset/course of SEs. Following  SIM, I showed them the [place] tmt area, explained check-in, arrival, preparation procedures. They verbalized understanding of information provided, understand I can be contacted with needs/concerns.  [name], RN, BSN, [place] at [place] [phone]                 Time Spent with Patient: 30 (11/25/14 1105)

## 2014-11-25 NOTE — Progress Notes (Signed)
Head and Neck Cancer Simulation, IMRT treatment planning, and Special treatment procedure note   Outpatient  Diagnosis:    ICD-9-CM ICD-10-CM   1. Cancer of base of tongue 141.0 C01     The patient was taken to the CT simulator and laid in the supine position on the table. An Aquaplast head and shoulder mask was custom fitted to the patient's anatomy. High-resolution CT axial imaging was obtained of the head and neck with contrast. I verified that the quality of the imaging is good for treatment planning. 1 Medically Necessary Treatment Device was fabricated and supervised by me: Aquaplast mask.   Treatment planning note I plan to treat the patient with IMRT. I plan to treat the patient's tumor and bilateral neck nodes. I plan to treat to a total dose of 70 Gray in 35  fractions. Dose calculation was ordered from dosimetry.  IMRT planning Note  IMRT is an important modality to deliver adequate dose to the patient's at risk tissues while sparing the patient's normal structures, including the: esophagus, parotid tissue, mandible, brain stem, spinal cord, oral cavity, brachial plexus.  This justifies the use of IMRT in the patient's treatment.   Special Treatment Procedure Note:  The patient will be receiving chemotherapy concurrently. Chemotherapy heightens the risk of side effects. I have considered this during the patient's treatment planning process and will monitor the patient accordingly for side effects on a weekly basis. Concurrent chemotherapy increases the complexity of this patient's treatment and therefore this constitutes a special treatment procedure.  -----------------------------------  Eppie Gibson, MD

## 2014-11-25 NOTE — Patient Instructions (Addendum)
SWALLOWING EXERCISES Do these once a day until you begin radiation, then as directed until 6 months after your last day of radiation, then 2 times per week afterwards  1. Effortful Swallows - Squeeze hard with the muscles in your neck while you swallow your  saliva or a sip of water - Repeat 10 times, 2 times a day, and use whenever you eat or drink  2. Masako Swallow - swallow with your tongue sticking out - Stick tongue out past your teeth and gently bite tongue with your teeth - Swallow, while holding your tongue with your teeth - Repeat 15-20 times, 2 times a day *use a wet spoon if your mouth gets dry*  3. Mendelsohn Maneuver - "half swallow" exercise - Start to swallow, and keep your Adam's apple up by squeezing hard with the muscles of the throat - Hold the squeeze for 5-7 seconds and then relax - Repeat 15 times, 2 times a day *use a wet spoon if your mouth gets dry*  4. Tongue Press - Press your entire tongue as hard as you can against the roof of your mouth for 3-5 seconds, then swallow hard - Repeat 10 times, 2 times a day  5. Breath Hold - Say "HUH!" loudly, then hold your breath for 3 seconds at your voice box - Repeat 20 times, 2 times a day  6. Chin pushback - Open your mouth  - Place your fist UNDER your chin near your neck, and push back with your fist for 5 seconds - Repeat 10 times, 2 times a day   ========================================================================  When you eat, swallow hard, alternate between food and liquid, and take small bites and sips

## 2014-11-25 NOTE — Therapy (Signed)
[facility] [address] [place], [place], [address] Phone: [phone]   Fax:  [phone]  Speech Language Pathology Evaluation  Patient Details  Name: Kenneth Jordan MRN: [id number] Date of Birth: [birth year] Referring Provider:  Eppie Gibson, MD  Encounter Date: 11/25/2014      End of Session - 11/25/14 1448    Visit Number 1   Number of Visits 3   Date for SLP Re-Evaluation 01/24/15   SLP Start Time 1320   SLP Stop Time  1410   SLP Time Calculation (min) 50 min   Activity Tolerance Patient tolerated treatment well      Past Medical History  Diagnosis Date  . Cancer of base of tongue     Left BOT  . Hypertension   . Retinitis pigmentosa of both eyes   . Anxiety   . Depression   . GERD (gastroesophageal reflux disease)     hx of   . Arthritis     shoulders, right knee     Past Surgical History  Procedure Laterality Date  . Biopsy tongue  10/24/14  . Knee surgery Right   . Tonsilectomy, adenoidectomy, bilateral myringotomy and tubes    . Feedign tube srugery     . Multiple extractions with alveoloplasty N/A 11/20/2014    Procedure: Extraction of tooth #'s 1,15,16,17,20,31 with alveoloplasty and gross debridement of remaining teeth;  Surgeon: Lenn Cal, DDS;  Location: [facility];  Service: Oral Surgery;  Laterality: N/A;    There were no vitals filed for this visit.  Visit Diagnosis: Dysphagia      Subjective Assessment - 11/25/14 1337    Subjective Difficulty chewing since tooth extraction - needs liquids/puree.             SLP Evaluation [facility] - 11/25/14 1324    SLP Visit Information   SLP Received On 11/25/14   Medical Diagnosis Base of tongue cancer   Pain Assessment   Pain Score 4    Pain Location Jaw   Pain Orientation Right   Pain Type Surgical pain;Acute pain   Prior Functional Status   Cognitive/Linguistic Baseline Within functional limits   Cognition   Overall  Cognitive Status Within Functional Limits for tasks assessed   Auditory Comprehension   Overall Auditory Comprehension Appears within functional limits for tasks assessed   Verbal Expression   Overall Verbal Expression Appears within functional limits for tasks assessed   Oral Motor/Sensory Function   Labial ROM Within Functional Limits   Labial Strength Within Functional Limits   Labial Coordination WFL   Lingual ROM Reduced right  pt reports due to oral healing from dental extractions   Lingual Strength Reduced  difficult to assess given healing from dental extractions   Lingual Coordination Reduced   Velum Within Functional Limits   Motor Speech   Overall Motor Speech Impaired   Articulation Impaired   Level of Impairment Word   Intelligibility Intelligibility reduced   Conversation 75-100% accurate  90-95%, due to oral healing   Motor Planning Witnin functional limits      Pt currently tolerates puree diet with thin liquids - daughter reports some coughing during meals but pt denies. Pt is currently placed with PEG, and had dental extractions 11-20-14 with reportedly complicated post-surgery healing.   POs: Pt took applesauce and water today without overt s/s aspiration. Thyroid elevation appeared somewhat reduced, and swallows appeared timely. Oral residue noted as minimal. Pt's swallow deemed WFL at this  time. Given the daughter's [name]) report of coughing, SLP cautioned pt to have small sips/bites and to alternate liquids and solids in 1:1 ratio during POs and swallow with effort.  Because data states the risk for dysphagia during and after radiation treatment is high due to undergoing radiation tx, SLP taught pt about the possibility of reduced/limited ability for PO intake during rad tx. SLP encouraged pt to continue swallowing POs as far into rad tx as possible.   SLP educated pt re: changes to swallowing musculature after rad tx, and why adherence to dysphagia HEP provided  today and PO consumption was necessary to inhibit muscle fibrosis following rad tx. Pt demonstrated understanding of these things to SLP.    SLP also developed a HEP for pt and pt was instructed how to perform exercises involving lingual, vocal, and pharyngeal strengthening. SLP performed each exercise and pt return demonstrated each exercise. SLP ensured pt performance was correct prior to moving on to next exercise. Pt was instructed to complete this program once a day until he begins radiation, then twice a day until 60 days after his last rad tx, then x2 a week after that.                     SLP Education - 2014-12-01 1448    Education provided Yes   Education Details HEP, precautions on "pt instructions"   Person(s) Educated Patient;Child(ren)   Methods Explanation;Demonstration;Verbal cues;Handout   Comprehension Verbalized understanding;Returned demonstration;Verbal cues required          SLP Short Term Goals - 12-01-14 1453    SLP SHORT TERM GOAL #1   Title pt will perform HEP with occasional min A   Time 1   Period --  visit   Status New   SLP SHORT TERM GOAL #2   Title pt will tell SLP why he is completing HEP   Time 1   Period --  visit   Status New          SLP Long Term Goals - 12/01/2014 1453    SLP LONG TERM GOAL #1   Title pt will tell SLP how a food journal can assist in returning to full PO diet   Time 2   Period --  visits   Status New   SLP LONG TERM GOAL #2   Title pt will complete HEP with rare min A   Time 2   Period --  visits   Status New          Plan - 12/01/14 1450    Clinical Impression Statement Pt presents today with reported mild dysphagia, as daughter reports pt coughs with meals. No overt s/s aspiration today with POs with SLP. SLP suggested pt adhere to small sips/bites, alternate 1:1 food/liquid to ensure safety with POs. Pt would benefit from skilled ST to assess success with HEP as well as safety with POs during  and after treatment for head/neck CA.   Speech Therapy Frequency --  once approx every four weeks   Duration --  for 60 days   Treatment/Interventions Aspiration precaution training;SLP instruction and feedback;Compensatory strategies;Pharyngeal strengthening exercises;Oral motor exercises;Patient/family education;Cueing hierarchy   Potential to Achieve Goals Good   SLP Home Exercise Plan provided today   Consulted and Agree with Plan of Care Patient          G-Codes - 12/01/2014 1454    Functional Assessment Tool Used noms, clinical judgement   Functional Limitations Swallowing  Swallow Current Status [phone]) At least 1 percent but less than 20 percent impaired, limited or restricted   Swallow Goal Status (A9191) At least 1 percent but less than 20 percent impaired, limited or restricted      Problem List Patient Active Problem List   Diagnosis Date Noted  . Cancer of base of tongue 11/08/2014  . Weight loss 11/08/2014  . Hemoptysis 11/08/2014  . Dysphagia 11/08/2014    [facility] , MS, CCC-SLP  11/25/2014, 2:55 PM  [facility] [address] [place], [place], [address] Phone: [phone]   Fax:  [phone]

## 2014-11-26 ENCOUNTER — Telehealth: Payer: Self-pay | Admitting: Hematology and Oncology

## 2014-11-26 ENCOUNTER — Other Ambulatory Visit: Payer: Self-pay | Admitting: *Deleted

## 2014-11-26 ENCOUNTER — Other Ambulatory Visit: Payer: Self-pay | Admitting: Hematology and Oncology

## 2014-11-26 NOTE — Telephone Encounter (Signed)
Faxed pt medical records to [facility]

## 2014-11-27 ENCOUNTER — Encounter: Payer: Self-pay | Admitting: Nutrition

## 2014-11-27 ENCOUNTER — Encounter: Payer: Self-pay | Admitting: *Deleted

## 2014-11-27 ENCOUNTER — Encounter (HOSPITAL_COMMUNITY): Payer: Self-pay | Admitting: Dentistry

## 2014-11-27 ENCOUNTER — Ambulatory Visit (HOSPITAL_COMMUNITY): Payer: Self-pay | Admitting: Dentistry

## 2014-11-27 VITALS — BP 117/57 | HR 65 | Temp 99.0°F

## 2014-11-27 DIAGNOSIS — K029 Dental caries, unspecified: Secondary | ICD-10-CM

## 2014-11-27 DIAGNOSIS — K08409 Partial loss of teeth, unspecified cause, unspecified class: Secondary | ICD-10-CM

## 2014-11-27 DIAGNOSIS — C01 Malignant neoplasm of base of tongue: Secondary | ICD-10-CM

## 2014-11-27 DIAGNOSIS — Z463 Encounter for fitting and adjustment of dental prosthetic device: Secondary | ICD-10-CM

## 2014-11-27 DIAGNOSIS — Z01818 Encounter for other preprocedural examination: Secondary | ICD-10-CM

## 2014-11-27 MED ORDER — SODIUM FLUORIDE 1.1 % DT GEL: DENTAL | Status: DC

## 2014-11-27 NOTE — Patient Instructions (Addendum)

## 2014-11-27 NOTE — Progress Notes (Signed)
  Oncology Nurse Navigator Documentation   Navigator Encounter Type: Other (11/27/14 1215)     Barriers/Navigation Needs: Education (11/27/14 1215)     In follow-up to patient report to RD that button PEG cannot be flushed, met with patient and his dtr in [place] to evaluate.  Dtr noted they had not been instructed to water flush PEG daily if not being used for nutritional supplementation.  Further noted that [facility] RN (not the one who has routinely been seeing them) was unable to flush PEG a couple of weeks ago. Upon visual assessment, PEG insertion site C, D and I, with no signs of infection. Using extension and syringe provided by patient, I was able to flush PEG using plunger without difficulty. I provided guidance/demonstration to patient and dtr on how to attach extension, instill water by gravity.  They verbalized understanding.  Dtr declined return demonstration, "I'll wait until the [facility] nurse sees [place] this afternoon" (not same nurse as noted above). Patient and dtr expressed appreciation for PEG assessment. I escorted them to [place] for next appt.  [name], RN, BSN, [place] at [place] [phone]               Time Spent with Patient: 30 (11/27/14 1215)

## 2014-11-27 NOTE — Progress Notes (Signed)
POST OPERATIVE NOTE:  11/27/2014   Kenneth Jordan [id number]  VITALS: BP 117/57 mmHg  Pulse 65  Temp(Src) 99 F (37.2 C) (Oral)  LABS:  Lab Results  Component Value Date   WBC 9.8 11/25/2014   HGB 12.4* 11/25/2014   HCT 38.1* 11/25/2014   MCV 79.8 11/25/2014   PLT 360 11/25/2014   BMET    Component Value Date/Time   NA 140 11/25/2014 1101   NA 134* 11/18/2014 1100   K 4.4 11/25/2014 1101   K 5.3* 11/18/2014 1100   CL 101 11/18/2014 1100   CO2 26 11/25/2014 1101   CO2 26 11/18/2014 1100   GLUCOSE 87 11/25/2014 1101   GLUCOSE 89 11/18/2014 1100   BUN 20.1 11/25/2014 1101   BUN 26* 11/18/2014 1100   CREATININE 1.2 11/25/2014 1101   CREATININE 1.44* 11/18/2014 1100   CALCIUM 9.8 11/25/2014 1101   CALCIUM 9.2 11/18/2014 1100   GFRNONAA 45* 11/18/2014 1100   GFRAA 53* 11/18/2014 1100    No results found for: INR, PROTIME No results found for: PTT   Kenneth Jordan is status post multiple extractions with alveoloplasty and gross debridement of remaining dentition in the operating room on 11/20/2014. The patient now presents for evaluation of healing and suture removal as indicated along with insertion of the fluoride trays. Patient was initially scheduled for additional dental restorations of the maxillary teeth, but the patient refuses at this time. Patient has had significant discomfort from the dental extractions as well as lip ulcerations after the surgery.  SUBJECTIVE: Patient with minimal discomfort at this time. Patient is currently using Aleve with good pain relief.  EXAM: There is no evidence of oral infection, heme, or ooze. Sutures are loosely intact. Generalized primary closure is noted. No significant lip ulceration or intraoral ulcerations are noted.  PROCEDURE: The patient was given a chlorhexidine gluconate rinse for 30 seconds. Sutures were then removed without complication. Patient tolerated the procedure well. Fluoride trays were then inserted  and adjusted as needed. Trismus device was fabricated at 45 mm with 25 sticks. Patient was provided written and verbal instructions on use and care of appliances. A prescription for FluoriSHIELD was sent to [facility] long outpatient pharmacy with PRN refills and normal directions.   ASSESSMENT: Post operative course is consistent with dental procedures performed in the OR. Loss of teeth secondary to extraction Dental caries  PLAN: 1. Continue salt water rinses every 2 hours while awake to aid healing. 2. Brush teeth after meals and at bedtime. Floss at bedtime. 3. Use fluoride in the fluoride trays at bedtime as instructed. 4. Use trismus exercises device as instructed. 5. Patient currently refuses to proceed with dental restorations at this time.  We will plan on having the patient follow-up with his primary dentist for the dental restorations approximately 3 months after the chemoradiation therapy is complete.  6. Return to dental medicine and 2-3 weeks for periodic oral examination during chemoradiation therapy. Call if problems arise before then.   Lenn Cal, DDS

## 2014-11-29 ENCOUNTER — Telehealth: Payer: Self-pay | Admitting: *Deleted

## 2014-11-29 NOTE — Telephone Encounter (Signed)
  Oncology Nurse Navigator Documentation   Navigator Encounter Type: Telephone (11/29/14 1332) Patient Visit Type: Follow-up (11/29/14 1332)     Called patient/dtr to check on 1) resolution of constipation s/p Dr. Calton Dach guidance of twice daily Miralax, 2) home health visit re feeding tube.  LVM requesting return call.  [name], RN, BSN, [place] at [place] [phone]                   Time Spent with Patient: 15 (11/29/14 1332)

## 2014-11-29 NOTE — Telephone Encounter (Signed)
  Oncology Nurse Navigator Documentation   Navigator Encounter Type: Telephone (11/29/14 1420)     Patient's dtr returned by earlier VMM. She reported:  [facility] RN confirmed PEG functional, intact.  Patient is having regular BMs s/p a day of BID Miralax.  Patient is eager to start tmts. I answered her question re emergent care if needed, guided  her to use [facility] ED unless EMS direct otherwise. She understands she can contact me with concerns/needs.  [name], RN, BSN, [place] at [place] [phone]                     Time Spent with Patient: 15 (11/29/14 1420)

## 2014-12-02 ENCOUNTER — Encounter: Payer: Self-pay | Admitting: *Deleted

## 2014-12-02 ENCOUNTER — Ambulatory Visit (HOSPITAL_BASED_OUTPATIENT_CLINIC_OR_DEPARTMENT_OTHER): Payer: Non-veteran care | Admitting: Hematology and Oncology

## 2014-12-02 ENCOUNTER — Other Ambulatory Visit: Payer: Self-pay | Admitting: Hematology and Oncology

## 2014-12-02 ENCOUNTER — Other Ambulatory Visit: Payer: Self-pay | Admitting: Physician Assistant

## 2014-12-02 ENCOUNTER — Other Ambulatory Visit: Payer: Non-veteran care

## 2014-12-02 ENCOUNTER — Telehealth: Payer: Self-pay | Admitting: Hematology and Oncology

## 2014-12-02 ENCOUNTER — Other Ambulatory Visit (HOSPITAL_BASED_OUTPATIENT_CLINIC_OR_DEPARTMENT_OTHER): Payer: Non-veteran care

## 2014-12-02 ENCOUNTER — Other Ambulatory Visit: Payer: Self-pay | Admitting: Radiology

## 2014-12-02 VITALS — BP 115/67 | HR 62 | Temp 98.3°F | Resp 20 | Ht 69.0 in | Wt 166.3 lb

## 2014-12-02 DIAGNOSIS — T402X5A Adverse effect of other opioids, initial encounter: Secondary | ICD-10-CM

## 2014-12-02 DIAGNOSIS — R634 Abnormal weight loss: Secondary | ICD-10-CM | POA: Diagnosis not present

## 2014-12-02 DIAGNOSIS — I952 Hypotension due to drugs: Secondary | ICD-10-CM

## 2014-12-02 DIAGNOSIS — Z09 Encounter for follow-up examination after completed treatment for conditions other than malignant neoplasm: Secondary | ICD-10-CM | POA: Insufficient documentation

## 2014-12-02 DIAGNOSIS — K5909 Other constipation: Secondary | ICD-10-CM

## 2014-12-02 DIAGNOSIS — C01 Malignant neoplasm of base of tongue: Secondary | ICD-10-CM

## 2014-12-02 DIAGNOSIS — K5903 Drug induced constipation: Secondary | ICD-10-CM

## 2014-12-02 DIAGNOSIS — Z510 Encounter for antineoplastic radiation therapy: Secondary | ICD-10-CM | POA: Diagnosis not present

## 2014-12-02 LAB — COMPREHENSIVE METABOLIC PANEL (CC13)
ALT: <9 U/L (ref 0–55)
ANION GAP: 7 meq/L (ref 3–11)
AST: 13 U/L (ref 5–34)
Albumin: 3.4 g/dL — ABNORMAL LOW (ref 3.5–5.0)
Alkaline Phosphatase: 53 U/L (ref 40–150)
BILIRUBIN TOTAL: 0.3 mg/dL (ref 0.20–1.20)
BUN: 20.4 mg/dL (ref 7.0–26.0)
CO2: 27 meq/L (ref 22–29)
Calcium: 9.5 mg/dL (ref 8.4–10.4)
Chloride: 106 mEq/L (ref 98–109)
Creatinine: 1.2 mg/dL (ref 0.7–1.3)
EGFR: 56 mL/min/{1.73_m2} — AB (ref >90)
Glucose: 87 mg/dl (ref 70–140)
Potassium: 4.7 mEq/L (ref 3.5–5.1)
Sodium: 140 mEq/L (ref 136–145)
TOTAL PROTEIN: 7 g/dL (ref 6.4–8.3)

## 2014-12-02 LAB — CBC WITH DIFFERENTIAL/PLATELET
BASO%: 0.9 % (ref 0.0–2.0)
BASOS ABS: 0.1 10*3/uL (ref 0.0–0.1)
EOS%: 2.4 % (ref 0.0–7.0)
Eosinophils Absolute: 0.2 10*3/uL (ref 0.0–0.5)
HEMATOCRIT: 38.3 % — AB (ref 38.4–49.9)
HEMOGLOBIN: 12.3 g/dL — AB (ref 13.0–17.1)
LYMPH#: 1.6 10*3/uL (ref 0.9–3.3)
LYMPH%: 18 % (ref 14.0–49.0)
MCH: 25.8 pg — AB (ref 27.2–33.4)
MCHC: 32.1 g/dL (ref 32.0–36.0)
MCV: 80.3 fL (ref 79.3–98.0)
MONO#: 0.7 10*3/uL (ref 0.1–0.9)
MONO%: 7.7 % (ref 0.0–14.0)
NEUT%: 71 % (ref 39.0–75.0)
NEUTROS ABS: 6.2 10*3/uL (ref 1.5–6.5)
Platelets: 387 10*3/uL (ref 140–400)
RBC: 4.76 10*6/uL (ref 4.20–5.82)
RDW: 15 % — AB (ref 11.0–14.6)
WBC: 8.7 10*3/uL (ref 4.0–10.3)

## 2014-12-02 LAB — MAGNESIUM (CC13): Magnesium: 2.4 mg/dl (ref 1.5–2.5)

## 2014-12-02 MED ORDER — LIDOCAINE-PRILOCAINE 2.5-2.5 % EX CREA: TOPICAL_CREAM | CUTANEOUS | Status: DC

## 2014-12-02 NOTE — Assessment & Plan Note (Signed)
This is related to cancer. I reinforced the importance of adequate calorie intake and recommend he take nutritional supplements as tolerated

## 2014-12-02 NOTE — Telephone Encounter (Signed)
Pt confirmed labs/ov per 10/03 POF, gave pt AVS and Calendar... KJ °

## 2014-12-02 NOTE — Assessment & Plan Note (Addendum)
The patient have recent severe constipation due to opiates. I recommend he taked Miralax regularly to stimulate regular bowel movement. I told him to discontinue naproxen due to risk of bleeding while on treatment.

## 2014-12-02 NOTE — Progress Notes (Signed)
[facility] OFFICE PROGRESS NOTE  Patient Care Team: Pcp Not In System as PCP - General Leota Sauers, RN as Oncology Nurse Navigator Heath Lark, MD as Consulting Physician (Hematology and Oncology) Eppie Gibson, MD as Attending Physician (Radiation Oncology) Karie Mainland, RD as Dietitian (Nutrition)  SUMMARY OF ONCOLOGIC HISTORY: Oncology History   Cancer of base of tongue   Staging form: Lip and Oral Cavity, AJCC 7th Edition     Clinical stage from 11/08/2014: Stage IVA (T4a, N2c, M0) - Signed by Heath Lark, MD on 11/08/2014       Cancer of base of tongue ([place])   07/08/2014 Miscellaneous  the patient noted tongue swelling / mass   10/04/2014 Imaging  CT scan done at the [facility] show large tongue mass with bilateral neck lymphadenopathy   10/07/2014 Miscellaneous  he was seen at the [facility] for evaluation was noted to have tongue cancer   10/10/2014 Imaging  PET CT scan from the [facility] showed extensive regional lymphadenopathy on the left side including supraclavicular lymph node involvement and possibly right jugulodigastric lymph node involvement.   10/24/2014 Procedure  he underwent laryngoscopy and biopsy. there is a large ulcerative tongue a mass on the left involving the tonsil, soft palate, epiglottis.   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 He returns for further follow-up. Port is scheduled for tomorrow and treatment will start on 12/04/2014. Recently, he has severe constipation, relieved by Miralax He is not consistent with using his feeding tube. He denies further hemoptysis. He continues to have persistent dysphagia  REVIEW OF SYSTEMS:   Constitutional: Denies fevers, chills or abnormal weight loss Eyes: Denies blurriness of vision Ears, nose, mouth,  throat, and face: Denies mucositis or sore throat Respiratory: Denies cough, dyspnea or wheezes Cardiovascular: Denies palpitation, chest discomfort or lower extremity swelling Skin: Denies abnormal skin rashes Lymphatics: Denies new lymphadenopathy or easy bruising Neurological:Denies numbness, tingling or new weaknesses Behavioral/Psych: Mood is stable, no new changes  All other systems were reviewed with the patient and are negative.  I have reviewed the past medical history, past surgical history, social history and family history with the patient and they are unchanged from previous note.  ALLERGIES:  is allergic to other.  MEDICATIONS:  Current Outpatient Prescriptions  Medication Sig Dispense Refill  . cetirizine (ZYRTEC) 10 MG tablet Take 10 mg by mouth at bedtime.     . fluticasone (FLONASE) 50 MCG/ACT nasal spray Place 1 spray into the nose every morning.     Marland Kitchen HYDROcodone-acetaminophen (HYCET) 7.5-325 mg/15 ml solution Take 15 mLs by mouth every 6 (six) hours as needed for moderate pain. 180 mL 0  . lidocaine-prilocaine (EMLA) cream Apply to affected area once 30 g 3  . loratadine (CLARITIN) 10 MG tablet Take 10 mg by mouth daily as needed for allergies.    . Menthol, Topical Analgesic, 16 % LIQD Apply 1 application topically daily. Shoulder pain.    . metoprolol (LOPRESSOR) 50 MG tablet Take 25 mg by mouth 2 (two) times daily.     . naproxen sodium (RA NAPROXEN SODIUM) 220 MG tablet Take 220 mg by mouth 2 (two) times daily as needed (pain).     Marland Kitchen omeprazole (PRILOSEC) 20 MG capsule Take 20 mg by mouth at bedtime.     Marland Kitchen  sodium fluoride (FLUORISHIELD) 1.1 % GEL dental gel Instill one drop of gel per tooth space of fluoride tray. Place over teeth for 5 minutes. Remove. Spit out excess. Repeat nightly 120 mL prn  . Tetrahydrozoline HCl (VISINE OP) Apply 1 drop to eye daily as needed (dry eyes).    Marland Kitchen zolpidem (AMBIEN) 10 MG tablet Take 5 mg by mouth at bedtime.      No current  facility-administered medications for this visit.    PHYSICAL EXAMINATION: ECOG PERFORMANCE STATUS: 1 - Symptomatic but completely ambulatory  Filed Vitals:   12/02/14 1157  BP: 115/67  Pulse: 62  Temp: 98.3 F (36.8 C)  Resp: 20   Filed Weights   12/02/14 1157  Weight: 166 lb 4.8 oz (75.433 kg)    GENERAL:alert, no distress and comfortable SKIN: skin color, texture, turgor are normal, no rashes or significant lesions EYES: normal, Conjunctiva are pink and non-injected, sclera clear OROPHARYNX: He has large base of tongue mass. NECK: supple, thyroid normal size, non-tender, without nodularity LYMPH:  Palpable lymphadenopathy in the left side of the neck, unchanged compared to previous exam LUNGS: clear to auscultation and percussion with normal breathing effort HEART: regular rate & rhythm and no murmurs and no lower extremity edema ABDOMEN:abdomen soft, non-tender and normal bowel sounds. Feeding tube site looks okay Musculoskeletal:no cyanosis of digits and no clubbing  NEURO: alert & oriented x 3 with poor hearing and slurring of speech  LABORATORY DATA:  I have reviewed the data as listed    Component Value Date/Time   NA 140 12/02/2014 1147   NA 134* 11/18/2014 1100   K 4.7 12/02/2014 1147   K 5.3* 11/18/2014 1100   CL 101 11/18/2014 1100   CO2 27 12/02/2014 1147   CO2 26 11/18/2014 1100   GLUCOSE 87 12/02/2014 1147   GLUCOSE 89 11/18/2014 1100   BUN 20.4 12/02/2014 1147   BUN 26* 11/18/2014 1100   CREATININE 1.2 12/02/2014 1147   CREATININE 1.44* 11/18/2014 1100   CALCIUM 9.5 12/02/2014 1147   CALCIUM 9.2 11/18/2014 1100   PROT 7.0 12/02/2014 1147   ALBUMIN 3.4* 12/02/2014 1147   AST 13 12/02/2014 1147   ALT <9 12/02/2014 1147   ALKPHOS 53 12/02/2014 1147   BILITOT 0.30 12/02/2014 1147   GFRNONAA 45* 11/18/2014 1100   GFRAA 53* 11/18/2014 1100    No results found for: SPEP, UPEP  Lab Results  Component Value Date   WBC 8.7 12/02/2014   NEUTROABS  6.2 12/02/2014   HGB 12.3* 12/02/2014   HCT 38.3* 12/02/2014   MCV 80.3 12/02/2014   PLT 387 12/02/2014      Chemistry      Component Value Date/Time   NA 140 12/02/2014 1147   NA 134* 11/18/2014 1100   K 4.7 12/02/2014 1147   K 5.3* 11/18/2014 1100   CL 101 11/18/2014 1100   CO2 27 12/02/2014 1147   CO2 26 11/18/2014 1100   BUN 20.4 12/02/2014 1147   BUN 26* 11/18/2014 1100   CREATININE 1.2 12/02/2014 1147   CREATININE 1.44* 11/18/2014 1100      Component Value Date/Time   CALCIUM 9.5 12/02/2014 1147   CALCIUM 9.2 11/18/2014 1100   ALKPHOS 53 12/02/2014 1147   AST 13 12/02/2014 1147   ALT <9 12/02/2014 1147   BILITOT 0.30 12/02/2014 1147      ASSESSMENT & PLAN:  Cancer of base of tongue ([place]) The decision was made based on recent publication on NEJM. Role  of treatment is curative and it is category 1 recommendation according to NCCN guidelines.  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 Shara Blazing, M.D., Carey Bullocks. Graylon Good, M.D., Trilby Leaver, M.D., Gweneth Dimitri, Ph.D., Jonelle Sports. Vernard Gambles, M.D., Blair Hailey. Patrice Paradise, M.D., Terese Door, M.D., Golden Circle, M.D., Ph.D., Kirk Ruths, M.D., Jonah Blue, M.D., Ph.D., Lennie Odor, M.D., Ph.D., Nelly Rout, M.D., Cameron Ali, M.D., Coralee North, M.D., Rana Snare, M.D., Kerry Hough. Rowinsky, M.D., and Clemon Chambers Ang, M.D., Ph.D.* [employer] [employer] Med 2006; 354:567-578February 9, 2006DOI: 10.1056/[id number]  The epidermal growth factor receptor (EGFR), a member of the ErbB family of receptor tyrosine kinases, is abnormally activated in epithelial cancers, including head and neck cancer. The cells of almost all such neoplasms express high levels of EGFR, a feature associated with a poor clinical outcome. Radiation increases the expression of EGFR in cancer cells, and blockade of EGFR signaling sensitizes cells to the effects of radiation.  The administration of intravenous cetuximab was initiated one  week before radiotherapy at a loading dose of 400 mg per square meter of body-surface area over a period of 120 minutes, followed by weekly 60-minute infusions of 250 mg per square meter for the duration of radiotherapy.  The median duration of locoregional control was 24.4 months among patients treated with cetuximab plus radiotherapy and 14.9 months among those given radiotherapy alone (hazard ratio for locoregional progression or death, 0.68; P=0.005). With a median follow-up of 54.0 months, the median duration of overall survival was 49.0 months among patients treated with combined therapy and 29.3 months among those treated with radiotherapy alone (hazard ratio for death, 0.74; P=0.03). Radiotherapy plus cetuximab significantly prolonged progression-free survival (hazard ratio for disease progression or death, 0.70; P=0.006). With the exception of acneiform rash and infusion reactions, the incidence of grade 3 or greater toxic effects, including mucositis, did not differ significantly between the two groups.  We discussed some of the risks, benefits, side-effects of Erbitux/cetuximab.  Some of the short term side-effects included, though not limited to, risk of fatigue, pancytopenia, life-threatening infections, allergic reactions, nausea, vomiting, sores in the mouth, changes in bowel habits especially diarrhea, admission to hospital for various reasons, and risks of death.   The patient is aware that the response rates discussed earlier is not guaranteed.    After a long discussion, patient made an informed decision to proceed.   Patient education material was dispensed.   I will see the patient on a weekly basis for supportive care.  Weight loss This is related to cancer. I reinforced the importance of adequate calorie intake and recommend he take nutritional supplements as tolerated    S/P gastrostomy tube (G tube) placement, follow-up exam The feeding tube site looks okay without  signs of infection. We will consult home health to follow  Drug-induced hypotension This is related to recent weight loss. I recommend he taper metoprolol to once a day and stop next week if his blood pressure remain low.  Constipation due to opioid therapy The patient have recent severe constipation due to opiates. I recommend he taked Miralax regularly to stimulate regular bowel movement. I told him to discontinue naproxen due to risk of bleeding while on treatment.   Orders Placed This Encounter  Procedures  . Magnesium    Standing Status: Standing     Number of Occurrences: 20     Standing Expiration Date: 12/03/2015   All questions were answered. The patient knows to call  the clinic with any problems, questions or concerns. No barriers to learning was detected. I spent 30 minutes counseling the patient face to face. The total time spent in the appointment was 40 minutes and more than 50% was on counseling and review of test results     [facility], [place], MD 12/02/2014 1:27 PM

## 2014-12-02 NOTE — Assessment & Plan Note (Signed)
The decision was made based on recent publication on NEJM. Role of treatment is curative and it is category 1 recommendation according to NCCN guidelines.  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 Shara Blazing, M.D., Carey Bullocks. Graylon Good, M.D., Trilby Leaver, M.D., Gweneth Dimitri, Ph.D., Jonelle Sports. Vernard Gambles, M.D., Blair Hailey. Patrice Paradise, M.D., Terese Door, M.D., Golden Circle, M.D., Ph.D., Kirk Ruths, M.D., Jonah Blue, M.D., Ph.D., Lennie Odor, M.D., Ph.D., Nelly Rout, M.D., Cameron Ali, M.D., Coralee North, M.D., Rana Snare, M.D., Kerry Hough. Rowinsky, M.D., and Clemon Chambers Ang, M.D., Ph.D.* [facility] 2006; 354:567-578February 9, 2006DOI: 10.1056/[id number]  The epidermal growth factor receptor (EGFR), a member of the ErbB family of receptor tyrosine kinases, is abnormally activated in epithelial cancers, including head and neck cancer. The cells of almost all such neoplasms express high levels of EGFR, a feature associated with a poor clinical outcome. Radiation increases the expression of EGFR in cancer cells, and blockade of EGFR signaling sensitizes cells to the effects of radiation.  The administration of intravenous cetuximab was initiated one week before radiotherapy at a loading dose of 400 mg per square meter of body-surface area over a period of 120 minutes, followed by weekly 60-minute infusions of 250 mg per square meter for the duration of radiotherapy.  The median duration of locoregional control was 24.4 months among patients treated with cetuximab plus radiotherapy and 14.9 months among those given radiotherapy alone (hazard ratio for locoregional progression or death, 0.68; P=0.005). With a median follow-up of 54.0 months, the median duration of overall survival was 49.0 months among patients treated with combined therapy and 29.3 months among those treated with radiotherapy alone (hazard ratio for death, 0.74; P=0.03). Radiotherapy plus cetuximab  significantly prolonged progression-free survival (hazard ratio for disease progression or death, 0.70; P=0.006). With the exception of acneiform rash and infusion reactions, the incidence of grade 3 or greater toxic effects, including mucositis, did not differ significantly between the two groups.  We discussed some of the risks, benefits, side-effects of Erbitux/cetuximab.  Some of the short term side-effects included, though not limited to, risk of fatigue, pancytopenia, life-threatening infections, allergic reactions, nausea, vomiting, sores in the mouth, changes in bowel habits especially diarrhea, admission to hospital for various reasons, and risks of death.   The patient is aware that the response rates discussed earlier is not guaranteed.    After a long discussion, patient made an informed decision to proceed.   Patient education material was dispensed.   I will see the patient on a weekly basis for supportive care.

## 2014-12-02 NOTE — Assessment & Plan Note (Signed)
The feeding tube site looks okay without signs of infection. We will consult home health to follow

## 2014-12-02 NOTE — Assessment & Plan Note (Signed)
This is related to recent weight loss. I recommend he taper metoprolol to once a day and stop next week if his blood pressure remain low.

## 2014-12-03 ENCOUNTER — Encounter: Payer: Self-pay | Admitting: *Deleted

## 2014-12-03 ENCOUNTER — Other Ambulatory Visit: Payer: Self-pay | Admitting: Interventional Radiology

## 2014-12-03 ENCOUNTER — Other Ambulatory Visit: Payer: Self-pay | Admitting: Hematology and Oncology

## 2014-12-03 ENCOUNTER — Ambulatory Visit (HOSPITAL_COMMUNITY)
Admission: RE | Admit: 2014-12-03 | Discharge: 2014-12-03 | Disposition: A | Discharge status: Home / Self Care | Payer: Medicare PPO | Source: Ambulatory Visit | Attending: Hematology and Oncology | Admitting: Hematology and Oncology

## 2014-12-03 ENCOUNTER — Encounter (HOSPITAL_COMMUNITY): Payer: Self-pay

## 2014-12-03 DIAGNOSIS — C01 Malignant neoplasm of base of tongue: Secondary | ICD-10-CM

## 2014-12-03 DIAGNOSIS — Z7951 Long term (current) use of inhaled steroids: Secondary | ICD-10-CM | POA: Insufficient documentation

## 2014-12-03 LAB — PROTIME-INR
INR: 1.06 (ref 0.00–1.49)
PROTHROMBIN TIME: 14 s (ref 11.6–15.2)

## 2014-12-03 MED ORDER — HEPARIN SOD (PORK) LOCK FLUSH 100 UNIT/ML IV SOLN
INTRAVENOUS | Status: AC
  Filled 2014-12-03: qty 5

## 2014-12-03 MED ORDER — MIDAZOLAM HCL 2 MG/2ML IJ SOLN
INTRAMUSCULAR | Status: AC
  Filled 2014-12-03: qty 6

## 2014-12-03 MED ORDER — MIDAZOLAM HCL 2 MG/2ML IJ SOLN
INTRAMUSCULAR | Status: AC | PRN
  Administered 2014-12-03 (×3): 1 mg via INTRAVENOUS

## 2014-12-03 MED ORDER — HEPARIN SOD (PORK) LOCK FLUSH 100 UNIT/ML IV SOLN
INTRAVENOUS | Status: DC | PRN
  Administered 2014-12-03: 500 [IU]

## 2014-12-03 MED ORDER — FENTANYL CITRATE (PF) 100 MCG/2ML IJ SOLN
INTRAMUSCULAR | Status: AC
  Filled 2014-12-03: qty 4

## 2014-12-03 MED ORDER — LIDOCAINE-EPINEPHRINE 2 %-1:100000 IJ SOLN
INTRAMUSCULAR | Status: AC
  Filled 2014-12-03: qty 1

## 2014-12-03 MED ORDER — CEFAZOLIN SODIUM-DEXTROSE 2-3 GM-% IV SOLR
2.0000 g | INTRAVENOUS | Status: AC
  Administered 2014-12-03: 2 g via INTRAVENOUS

## 2014-12-03 MED ORDER — FENTANYL CITRATE (PF) 100 MCG/2ML IJ SOLN
INTRAMUSCULAR | Status: AC | PRN
  Administered 2014-12-03: 50 ug via INTRAVENOUS

## 2014-12-03 MED ORDER — SODIUM CHLORIDE 0.9 % IV SOLN
INTRAVENOUS | Status: DC
  Administered 2014-12-03: 13:00:00 via INTRAVENOUS

## 2014-12-03 MED ORDER — CEFAZOLIN SODIUM-DEXTROSE 2-3 GM-% IV SOLR
INTRAVENOUS | Status: AC
  Filled 2014-12-03: qty 50

## 2014-12-03 NOTE — Discharge Instructions (Signed)
Implanted Port Insertion, Care After °Refer to this sheet in the next few weeks. These instructions provide you with information on caring for yourself after your procedure. Your health care provider may also give you more specific instructions. Your treatment has been planned according to current medical practices, but problems sometimes occur. Call your health care provider if you have any problems or questions after your procedure. °WHAT TO EXPECT AFTER THE PROCEDURE °After your procedure, it is typical to have the following:  °· Discomfort at the port insertion site. Ice packs to the area will help. °· Bruising on the skin over the port. This will subside in 3-4 days. °HOME CARE INSTRUCTIONS °· After your port is placed, you will get a manufacturer's information card. The card has information about your port. Keep this card with you at all times.   °· Know what kind of port you have. There are many types of ports available.   °· Wear a medical alert bracelet in case of an emergency. This can help alert health care workers that you have a port.   °· The port can stay in for as long as your health care provider believes it is necessary.   °· A home health care nurse may give medicines and take care of the port.   °· You or a family member can get special training and directions for giving medicine and taking care of the port at home.   °SEEK MEDICAL CARE IF:  °· Your port does not flush or you are unable to get a blood return.   °· You have a fever or chills. °SEEK IMMEDIATE MEDICAL CARE IF: °· You have new fluid or pus coming from your incision.   °· You notice a bad smell coming from your incision site.   °· You have swelling, pain, or more redness at the incision or port site.   °· You have chest pain or shortness of breath. °Document Released: 12/06/2012 Document Revised: 02/20/2013 Document Reviewed: 12/06/2012 °ExitCare® Patient Information ©2015 [employer]. This information is not intended to replace  advice given to you by your health care provider. Make sure you discuss any questions you have with your health care provider. °Implanted Port Home Guide °An implanted port is a type of central line that is placed under the skin. Central lines are used to provide IV access when treatment or nutrition needs to be given through a person's veins. Implanted ports are used for long-term IV access. An implanted port may be placed because:  °· You need IV medicine that would be irritating to the small veins in your hands or arms.   °· You need long-term IV medicines, such as antibiotics.   °· You need IV nutrition for a long period.   °· You need frequent blood draws for lab tests.   °· You need dialysis.   °Implanted ports are usually placed in the chest area, but they can also be placed in the upper arm, the abdomen, or the leg. An implanted port has two main parts:  °· Reservoir. The reservoir is round and will appear as a small, raised area under your skin. The reservoir is the part where a needle is inserted to give medicines or draw blood.   °· Catheter. The catheter is a thin, flexible tube that extends from the reservoir. The catheter is placed into a large vein. Medicine that is inserted into the reservoir goes into the catheter and then into the vein.   °HOW WILL I CARE FOR MY INCISION SITE? °Do not get the incision site wet. Bathe or   shower as directed by your health care provider.  °HOW IS MY PORT ACCESSED? °Special steps must be taken to access the port:  °· Before the port is accessed, a numbing cream can be placed on the skin. This helps numb the skin over the port site.   °· Your health care provider uses a sterile technique to access the port. °· Your health care provider must put on a mask and sterile gloves. °· The skin over your port is cleaned carefully with an antiseptic and allowed to dry. °· The port is gently pinched between sterile gloves, and a needle is inserted into the port. °· Only  "non-coring" port needles should be used to access the port. Once the port is accessed, a blood return should be checked. This helps ensure that the port is in the vein and is not clogged.   °· If your port needs to remain accessed for a constant infusion, a clear (transparent) bandage will be placed over the needle site. The bandage and needle will need to be changed every week, or as directed by your health care provider.   °· Keep the bandage covering the needle clean and dry. Do not get it wet. Follow your health care provider's instructions on how to take a shower or bath while the port is accessed.   °· If your port does not need to stay accessed, no bandage is needed over the port.   °WHAT IS FLUSHING? °Flushing helps keep the port from getting clogged. Follow your health care provider's instructions on how and when to flush the port. Ports are usually flushed with saline solution or a medicine called heparin. The need for flushing will depend on how the port is used.  °· If the port is used for intermittent medicines or blood draws, the port will need to be flushed:   °· After medicines have been given.   °· After blood has been drawn.   °· As part of routine maintenance.   °· If a constant infusion is running, the port may not need to be flushed.   °HOW LONG WILL MY PORT STAY IMPLANTED? °The port can stay in for as long as your health care provider thinks it is needed. When it is time for the port to come out, surgery will be done to remove it. The procedure is similar to the one performed when the port was put in.  °WHEN SHOULD I SEEK IMMEDIATE MEDICAL CARE? °When you have an implanted port, you should seek immediate medical care if:  °· You notice a bad smell coming from the incision site.   °· You have swelling, redness, or drainage at the incision site.   °· You have more swelling or pain at the port site or the surrounding area.   °· You have a fever that is not controlled with medicine. °Document  Released: 02/15/2005 Document Revised: 12/06/2012 Document Reviewed: 10/23/2012 °ExitCare® Patient Information ©2015 [employer]. This information is not intended to replace advice given to you by your health care provider. Make sure you discuss any questions you have with your health care provider.Conscious Sedation, Adult, Care After °Refer to this sheet in the next few weeks. These instructions provide you with information on caring for yourself after your procedure. Your health care provider may also give you more specific instructions. Your treatment has been planned according to current medical practices, but problems sometimes occur. Call your health care provider if you have any problems or questions after your procedure. °WHAT TO EXPECT AFTER THE PROCEDURE  °After your procedure: °·   You may feel sleepy, clumsy, and have poor balance for several hours. °· Vomiting may occur if you eat too soon after the procedure. °HOME CARE INSTRUCTIONS °· Do not participate in any activities where you could become injured for at least 24 hours. Do not: °¨ Drive. °¨ Swim. °¨ Ride a bicycle. °¨ Operate heavy machinery. °¨ Cook. °¨ Use power tools. °¨ Climb ladders. °¨ Work from a high place. °· Do not make important decisions or sign legal documents until you are improved. °· If you vomit, drink water, juice, or soup when you can drink without vomiting. Make sure you have little or no nausea before eating solid foods. °· Only take over-the-counter or prescription medicines for pain, discomfort, or fever as directed by your health care provider. °· Make sure you and your family fully understand everything about the medicines given to you, including what side effects may occur. °· You should not drink alcohol, take sleeping pills, or take medicines that cause drowsiness for at least 24 hours. °· If you smoke, do not smoke without supervision. °· If you are feeling better, you may resume normal activities 24 hours after you  were sedated. °· Keep all appointments with your health care provider. °SEEK MEDICAL CARE IF: °· Your skin is pale or bluish in color. °· You continue to feel nauseous or vomit. °· Your pain is getting worse and is not helped by medicine. °· You have bleeding or swelling. °· You are still sleepy or feeling clumsy after 24 hours. °SEEK IMMEDIATE MEDICAL CARE IF: °· You develop a rash. °· You have difficulty breathing. °· You develop any type of allergic problem. °· You have a fever. °MAKE SURE YOU: °· Understand these instructions. °· Will watch your condition. °· Will get help right away if you are not doing well or get worse. °Document Released: 12/06/2012 Document Reviewed: 12/06/2012 °ExitCare® Patient Information ©2015 [employer]. This information is not intended to replace advice given to you by your health care provider. Make sure you discuss any questions you have with your health care provider. ° °

## 2014-12-03 NOTE — H&P (Signed)
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 He has been seen by Dr. Alvy Bimler 12/02/14 and is scheduled today for image guided port a catheter placement for chemotherapy to start tomorrow.   The patient has had a H&P performed within the last 30 days, all history, medications, and exam have been reviewed. The patient denies any interval changes since the H&P.  Medications: Prior to Admission medications   Medication Sig Start Date End Date Taking? Authorizing Provider  HYDROcodone-acetaminophen (HYCET) 7.5-325 mg/15 ml solution Take 15 mLs by mouth every 6 (six) hours as needed for moderate pain. 11/20/14 11/20/15 Yes Lenn Cal, DDS  loratadine (CLARITIN) 10 MG tablet Take 10 mg by mouth daily as needed for allergies.   Yes Historical Provider, MD  metoprolol (LOPRESSOR) 50 MG tablet Take 25 mg by mouth 2 (two) times daily.    Yes Historical Provider, MD  zolpidem (AMBIEN) 10 MG tablet Take 5 mg by mouth at bedtime.    Yes Historical Provider, MD  cetirizine (ZYRTEC) 10 MG tablet Take 10 mg by mouth at bedtime.     Historical Provider, MD  fluticasone (FLONASE) 50 MCG/ACT nasal spray Place 1 spray into the nose every morning.     Historical Provider, MD  lidocaine-prilocaine (EMLA) cream Apply to affected area once 12/02/14   Heath Lark, MD  Menthol, Topical Analgesic, 16 % LIQD Apply 1 application topically daily. Shoulder pain.    Historical Provider, MD  naproxen sodium (RA NAPROXEN SODIUM) 220 MG tablet Take 220 mg by mouth 2 (two) times daily as needed (pain).     Historical Provider, MD  omeprazole (PRILOSEC) 20 MG capsule Take 20 mg by mouth at bedtime.     Historical Provider, MD  sodium fluoride (FLUORISHIELD) 1.1 % GEL dental gel Instill one drop of gel per tooth space of fluoride tray. Place over teeth for 5 minutes. Remove. Spit out excess. Repeat nightly 11/27/14   Lenn Cal, DDS  Tetrahydrozoline HCl (VISINE OP) Apply 1 drop to eye daily as needed  (dry eyes).    Historical Provider, MD     Vital Signs: T: 98.4 F, HR: 57 bpm, BP: 136/60 mmHg, O2: 100 % RA  Physical Exam  Constitutional: He is oriented to person, place, and time. No distress.  HENT:  Head: Normocephalic and atraumatic.  Cardiovascular: Normal rate and regular rhythm.  Exam reveals no gallop and no friction rub.   No murmur heard. Pulmonary/Chest: Effort normal and breath sounds normal. No respiratory distress. He has no wheezes. He has no rales.  Abdominal: Soft. Bowel sounds are normal. He exhibits no distension. There is no tenderness.  Neurological: He is alert and oriented to person, place, and time.  Skin: He is not diaphoretic.    Mallampati Score:  MD Evaluation Airway: WNL Heart: WNL Abdomen: WNL Chest/ Lungs: WNL ASA  Classification: 2 Mallampati/Airway Score: Two  Labs:  CBC:  Recent Labs  11/18/14 1100 11/25/14 1101 12/02/14 1147  WBC 8.4 9.8 8.7  HGB 12.0* 12.4* 12.3*  HCT 37.7* 38.1* 38.3*  PLT 352 360 387    COAGS: No results for input(s): INR, APTT in the last 8760 hours.  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 Alvy Bimler 12/02/14 Scheduled today for image guided port a catheter placement with sedation The patient has been NPO, no blood thinners taken, labs and vitals have been reviewed. Risks and Benefits discussed with the patient including, but not limited to bleeding, infection, pneumothorax, or fibrin sheath development and need for additional  procedures. All of the patient's questions were answered, patient is agreeable to proceed. Consent signed and in chart.    SignedTsosie Billing D 12/03/2014, 2:02 PM

## 2014-12-03 NOTE — Procedures (Signed)
Successful placement of right IJ approach port-a-cath with tip at the superior caval atrial junction. The catheter is ready for immediate use. No immediate post procedural complications.  Jay Geniva Lohnes, MD Pager #: [phone]   

## 2014-12-03 NOTE — Progress Notes (Signed)
  Oncology Nurse Navigator Documentation   Navigator Encounter Type: Clinic/MDC (12/02/14 1230) Patient Visit Type: Medonc (12/02/14 1230)   Barriers/Navigation Needs: Transportation;Education (12/02/14 1230)   Interventions: Education Method (12/02/14 1230)     Education Method: Verbal (12/02/14 1230)      To provide support and encouragement, care continuity and to assess for needs, met with patient during weekly appt with Dr. Alvy Bimler.  He was accompanied by his dtr [name]. Re PEG (button style), he reported:  Instilling 1 can supplement daily; flushing without difficulty by gravity, use of plunger difficult.  Difficulty attaching extension himself ("can't see it").  This limits his self-sufficiency, he has to rely on dtr. I suggested he try leaving extension attached and secure it with Medipore to facilitate his independence with its use.  He stated he would try this approach.  Showering is awkward b/c he tries to cover PEG with one hand while washing.  I provided guidance on showering, cleaning PEG site afterwards; I provided care supplies - saline, applicators, 2x2 gauze, Medipore tape. He noted he is going to contact the [facility] to request transportation assistance as his dtr works and will have difficulty bringing him to daily appts.  I offered assistance with other means if needed. He and dtr verbalized understanding of information provided. They understand I can be contacted.   [name], RN, BSN, [place] at [place] [phone]    Time Spent with Patient: 45 (12/02/14 1230)

## 2014-12-03 NOTE — Progress Notes (Signed)
  Oncology Nurse Navigator Documentation   Navigator Encounter Type: Other (12/03/14 1305) Patient Visit Type: Surgery (12/03/14 1305)     To provide support, encouragement, and care continuity, met with patient at [place] prior to his [facility] placement.  His dtr Mickel Baas was at the bedside. We reviewed the progress he has made in preparation for tomorrow's start of chemo and radiation tmts. He acknowledged he is ready to start tmts. They understand I can be contacted with needs/concerns.  [name], RN, BSN, [place] at [place] [phone]                     Time Spent with Patient: 15 (12/03/14 1305)

## 2014-12-04 ENCOUNTER — Ambulatory Visit (HOSPITAL_BASED_OUTPATIENT_CLINIC_OR_DEPARTMENT_OTHER): Payer: Non-veteran care | Admitting: Nurse Practitioner

## 2014-12-04 ENCOUNTER — Encounter: Payer: Self-pay | Admitting: *Deleted

## 2014-12-04 ENCOUNTER — Ambulatory Visit: Payer: Non-veteran care | Admitting: Nutrition

## 2014-12-04 ENCOUNTER — Ambulatory Visit: Admission: RE | Admit: 2014-12-04 | Payer: Non-veteran care | Source: Ambulatory Visit | Admitting: Radiation Oncology

## 2014-12-04 ENCOUNTER — Ambulatory Visit
Admission: RE | Admit: 2014-12-04 | Discharge: 2014-12-04 | Disposition: A | Discharge status: Home / Self Care | Payer: Non-veteran care | Source: Ambulatory Visit | Attending: Radiation Oncology | Admitting: Radiation Oncology

## 2014-12-04 ENCOUNTER — Ambulatory Visit (HOSPITAL_BASED_OUTPATIENT_CLINIC_OR_DEPARTMENT_OTHER): Payer: Non-veteran care

## 2014-12-04 ENCOUNTER — Encounter: Payer: Self-pay | Admitting: Nurse Practitioner

## 2014-12-04 VITALS — BP 121/61 | HR 71 | Temp 99.1°F | Resp 16

## 2014-12-04 DIAGNOSIS — T7840XA Allergy, unspecified, initial encounter: Secondary | ICD-10-CM | POA: Diagnosis not present

## 2014-12-04 DIAGNOSIS — C01 Malignant neoplasm of base of tongue: Secondary | ICD-10-CM

## 2014-12-04 DIAGNOSIS — Z510 Encounter for antineoplastic radiation therapy: Secondary | ICD-10-CM | POA: Diagnosis not present

## 2014-12-04 DIAGNOSIS — Z5112 Encounter for antineoplastic immunotherapy: Secondary | ICD-10-CM

## 2014-12-04 MED ORDER — HEPARIN SOD (PORK) LOCK FLUSH 100 UNIT/ML IV SOLN
500.0000 [IU] | Freq: Once | INTRAVENOUS | Status: AC | PRN
  Administered 2014-12-04: 500 [IU]
  Filled 2014-12-04: qty 5

## 2014-12-04 MED ORDER — SODIUM CHLORIDE 0.9 % IJ SOLN
10.0000 mL | INTRAMUSCULAR | Status: DC | PRN
  Administered 2014-12-04: 10 mL
  Filled 2014-12-04: qty 10

## 2014-12-04 MED ORDER — DIPHENHYDRAMINE HCL 50 MG/ML IJ SOLN
INTRAMUSCULAR | Status: AC
  Filled 2014-12-04: qty 1

## 2014-12-04 MED ORDER — METHYLPREDNISOLONE SODIUM SUCC 125 MG IJ SOLR
125.0000 mg | Freq: Once | INTRAMUSCULAR | Status: AC | PRN
  Administered 2014-12-04: 125 mg via INTRAVENOUS

## 2014-12-04 MED ORDER — MEPERIDINE HCL 25 MG/ML IJ SOLN
25.0000 mg | Freq: Once | INTRAMUSCULAR | Status: AC
  Administered 2014-12-04: 25 mg via INTRAVENOUS

## 2014-12-04 MED ORDER — DIPHENHYDRAMINE HCL 50 MG/ML IJ SOLN
50.0000 mg | Freq: Once | INTRAMUSCULAR | Status: AC
  Administered 2014-12-04: 50 mg via INTRAVENOUS

## 2014-12-04 MED ORDER — CETUXIMAB CHEMO IV INJECTION 200 MG/100ML
400.0000 mg/m2 | Freq: Once | INTRAVENOUS | Status: AC
  Administered 2014-12-04: 800 mg via INTRAVENOUS
  Filled 2014-12-04: qty 400

## 2014-12-04 MED ORDER — MEPERIDINE HCL 25 MG/ML IJ SOLN
INTRAMUSCULAR | Status: AC
  Filled 2014-12-04: qty 1

## 2014-12-04 MED ORDER — FAMOTIDINE IN NACL 20-0.9 MG/50ML-% IV SOLN
20.0000 mg | Freq: Once | INTRAVENOUS | Status: AC | PRN
  Administered 2014-12-04: 20 mg via INTRAVENOUS

## 2014-12-04 MED ORDER — SODIUM CHLORIDE 0.9 % IV SOLN
Freq: Once | INTRAVENOUS | Status: AC
  Administered 2014-12-04: 13:00:00 via INTRAVENOUS

## 2014-12-04 NOTE — Progress Notes (Signed)
SYMPTOM MANAGEMENT CLINIC   HPI: Kenneth Jordan 79 y.o. male diagnosed with tongue cancer.  Presented to the [facility] today to initiate Erbitux chemotherapy regimen; and is also scheduled to receive his first radiation treatment as well.  Patient has been diagnosed with tongue cancer; and presented to the [facility] today to initiate his Erbitux chemotherapy regimen.  He is also scheduled to receive his first cycle of radiation treatments today as well.  Patient received approximately half of the Erbitux infusion when he developed a hypersensitivity reaction which consisted of severe rigors, mild hypertension, and some cyanosis of his fingernail beds.  Erbitux infusion was immediately held; and patient was given Pepcid 20 mg, a Medrol 125 mg, and a total of 25 mg of Demerol IV.  Confirmed that patient had already received Benadryl 50 mg just prior to initiating the Erbitux infusion as a premedication.  After a period of close observation symptoms did resolve; and vital signs stabilized. Patient was approved for re-challenge per Dr. Alvy Bimler; and is currently receiving the remainder of his Erbitux with no difficulty.   Dr. Alvy Bimler has requested that pt be pledgeted an extra 2 hours for next week's infusion of his Erbitux.  Will alert scheduling to arrange.  HPI  ROS  Past Medical History  Diagnosis Date  . 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 Hypertension   . Retinitis pigmentosa of both eyes   . Anxiety   . Depression   . GERD (gastroesophageal reflux disease)     hx of   . Arthritis     shoulders, right knee     Past Surgical History  Procedure Laterality Date  . Biopsy tongue  10/24/14  . Knee surgery Right   . Tonsilectomy, adenoidectomy, bilateral myringotomy and tubes    . Feedign tube srugery     . Multiple extractions with alveoloplasty N/A 11/20/2014    Procedure: Extraction of tooth #'s 1,15,16,17,20,31 with alveoloplasty and gross debridement of  remaining teeth;  Surgeon: Lenn Cal, DDS;  Location: [facility];  Service: Oral Surgery;  Laterality: N/A;    has Cancer of base of tongue ([place]); Weight loss; Hemoptysis; Dysphagia; S/P gastrostomy tube (G tube) placement, follow-up exam; Drug-induced hypotension; Constipation due to opioid therapy; and Hypersensitivity reaction on his problem list.    is allergic to other.    Medication List       This list is accurate as of: 12/04/14  6:01 PM.  Always use your most recent med list.               cetirizine 10 MG tablet  Commonly known as:  ZYRTEC  Take 10 mg by mouth at bedtime.     fluticasone 50 MCG/ACT nasal spray  Commonly known as:  FLONASE  Place 1 spray into the nose every morning.     HYDROcodone-acetaminophen 7.5-325 mg/15 ml solution  Commonly known as:  HYCET  Take 15 mLs by mouth every 6 (six) hours as needed for moderate pain.     lidocaine-prilocaine cream  Commonly known as:  EMLA  Apply to affected area once     loratadine 10 MG tablet  Commonly known as:  CLARITIN  Take 10 mg by mouth daily as needed for allergies.     Menthol (Topical Analgesic) 16 % Liqd  Apply 1 application topically daily. Shoulder pain.     metoprolol 50 MG tablet  Commonly known as:  LOPRESSOR  Take 25 mg by mouth 2 (two) times daily.     omeprazole 20 MG capsule  Commonly known as:  PRILOSEC  Take 20 mg by mouth at bedtime.     RA NAPROXEN SODIUM 220 MG tablet  Generic drug:  naproxen sodium  Take 220 mg by mouth 2 (two) times daily as needed (pain).     sodium fluoride 1.1 % Gel dental gel  Commonly known as:  FLUORISHIELD  Instill one drop of gel per tooth space of fluoride tray. Place over teeth for 5 minutes. Remove. Spit out excess. Repeat nightly     VISINE OP  Apply 1 drop to eye daily as needed (dry eyes).     zolpidem 10 MG tablet  Commonly known as:  AMBIEN  Take 5 mg by mouth at bedtime.         PHYSICAL EXAMINATION  Oncology Vitals 12/04/2014  12/04/2014 12/04/2014 12/04/2014 12/04/2014 12/04/2014 12/04/2014  Height - - - - - - -  Weight - - - - - - -  Weight (lbs) - - - - - - -  BMI (kg/m2) - - - - - - -  Temp 99.1 98.8 - - - - -  Pulse 71 81 89 98 93 95 99  Resp 16 16 20 20 20 20 20   SpO2 - 100 100 100 100 100 100  BSA (m2) - - - - - - -   BP Readings from Last 3 Encounters:  12/04/14 121/61  12/03/14 136/60  12/03/14 128/77    Physical Exam  Constitutional: He is oriented to person, place, and time and well-developed, well-nourished, and in no distress.  HENT:  Head: Normocephalic and atraumatic.  Eyes: Conjunctivae and EOM are normal. Pupils are equal, round, and reactive to light. Right eye exhibits no discharge. Left eye exhibits no discharge. No scleral icterus.  Neck: Normal range of motion. Neck supple. No JVD present. No tracheal deviation present. No thyromegaly present.  Cardiovascular: Normal rate, regular rhythm, normal heart sounds and intact distal pulses.   Pulmonary/Chest: Effort normal and breath sounds normal. No stridor. No respiratory distress. He has no wheezes. He has no rales. He exhibits no tenderness.  Abdominal: Soft. Bowel sounds are normal. He exhibits no distension and no mass. There is no tenderness. There is no rebound and no guarding.  Musculoskeletal: Normal range of motion. He exhibits no edema or tenderness.  Lymphadenopathy:    He has no cervical adenopathy.  Neurological: He is alert and oriented to person, place, and time.  Skin: Skin is warm and dry. No rash noted. No erythema. No pallor.  Psychiatric: Affect normal.  Nursing note and vitals reviewed.   LABORATORY DATA:. Hospital Outpatient Visit on 12/03/2014  Component Date Value Ref Range Status  . Prothrombin Time 12/03/2014 14.0  11.6 - 15.2 seconds Final  . INR 12/03/2014 1.06  0.00 - 1.49 Final  Appointment on 12/02/2014  Component Date Value Ref Range Status  . Sodium 12/02/2014 140  136 - 145 mEq/L Final  . Potassium  12/02/2014 4.7  3.5 - 5.1 mEq/L Final  . Chloride 12/02/2014 106  98 - 109 mEq/L Final  . CO2 12/02/2014 27  22 - 29 mEq/L Final  . Glucose 12/02/2014 87  70 - 140 mg/dl Final   Glucose reference range is for nonfasting patients. Fasting glucose reference range is 70- 100.  Marland Kitchen BUN 12/02/2014 20.4  7.0 - 26.0 mg/dL Final  . Creatinine 12/02/2014 1.2  0.7 - 1.3  mg/dL Final  . Total Bilirubin 12/02/2014 0.30  0.20 - 1.20 mg/dL Final  . Alkaline Phosphatase 12/02/2014 53  40 - 150 U/L Final  . AST 12/02/2014 13  5 - 34 U/L Final  . ALT 12/02/2014 <9  0 - 55 U/L Final  . Total Protein 12/02/2014 7.0  6.4 - 8.3 g/dL Final  . Albumin 12/02/2014 3.4* 3.5 - 5.0 g/dL Final  . Calcium 12/02/2014 9.5  8.4 - 10.4 mg/dL Final  . Anion Gap 12/02/2014 7  3 - 11 mEq/L Final  . EGFR 12/02/2014 56* >90 ml/min/1.73 m2 Final   eGFR is calculated using the CKD-EPI Creatinine Equation (2009)  . WBC 12/02/2014 8.7  4.0 - 10.3 10e3/uL Final  . NEUT# 12/02/2014 6.2  1.5 - 6.5 10e3/uL Final  . HGB 12/02/2014 12.3* 13.0 - 17.1 g/dL Final  . HCT 12/02/2014 38.3* 38.4 - 49.9 % Final  . Platelets 12/02/2014 387  140 - 400 10e3/uL Final  . MCV 12/02/2014 80.3  79.3 - 98.0 fL Final  . MCH 12/02/2014 25.8* 27.2 - 33.4 pg Final  . MCHC 12/02/2014 32.1  32.0 - 36.0 g/dL Final  . RBC 12/02/2014 4.76  4.20 - 5.82 10e6/uL Final  . RDW 12/02/2014 15.0* 11.0 - 14.6 % Final  . lymph# 12/02/2014 1.6  0.9 - 3.3 10e3/uL Final  . MONO# 12/02/2014 0.7  0.1 - 0.9 10e3/uL Final  . Eosinophils Absolute 12/02/2014 0.2  0.0 - 0.5 10e3/uL Final  . Basophils Absolute 12/02/2014 0.1  0.0 - 0.1 10e3/uL Final  . NEUT% 12/02/2014 71.0  39.0 - 75.0 % Final  . LYMPH% 12/02/2014 18.0  14.0 - 49.0 % Final  . MONO% 12/02/2014 7.7  0.0 - 14.0 % Final  . EOS% 12/02/2014 2.4  0.0 - 7.0 % Final  . BASO% 12/02/2014 0.9  0.0 - 2.0 % Final  . Magnesium 12/02/2014 2.4  1.5 - 2.5 mg/dl Final     RADIOGRAPHIC STUDIES: Ir Fluoro Guide Cv Line  Right  12/03/2014   INDICATION: History of tongue cancer. In need of durable intravenous access for chemotherapy administration.  EXAM: IMPLANTED PORT A CATH PLACEMENT WITH ULTRASOUND AND FLUOROSCOPIC GUIDANCE  COMPARISON:  Neck CT - 10/04/2014  MEDICATIONS: Ancef 2 gm IV; The antibiotic was administered within an appropriate time interval prior to skin puncture.  ANESTHESIA/SEDATION: Versed 3 mg IV; Fentanyl 50 mcg IV;  Total Moderate Sedation Time  20  minutes.  CONTRAST:  None  FLUOROSCOPY TIME:  28 seconds (7 mGy)  COMPLICATIONS: None immediate  PROCEDURE: The procedure, risks, benefits, and alternatives were explained to the patient. Questions regarding the procedure were encouraged and answered. The patient understands and consents to the procedure.  The right neck and chest were prepped with chlorhexidine in a sterile fashion, and a sterile drape was applied covering the operative field. Maximum barrier sterile technique with sterile gowns and gloves were used for the procedure. A timeout was performed prior to the initiation of the procedure. Local anesthesia was provided with 1% lidocaine with epinephrine.  After creating a small venotomy incision, a micropuncture kit was utilized to access the internal jugular vein under direct, real-time ultrasound guidance. Ultrasound image documentation was performed. The microwire was kinked to measure appropriate catheter length.  A subcutaneous port pocket was then created along the upper chest wall utilizing a combination of sharp and blunt dissection. The pocket was irrigated with sterile saline. A single lumen ISP power injectable port was chosen for placement. The 8  Fr catheter was tunneled from the port pocket site to the venotomy incision. The port was placed in the pocket. The external catheter was trimmed to appropriate length. At the venotomy, an 8 Fr peel-away sheath was placed over a guidewire under fluoroscopic guidance. The catheter was then placed  through the sheath and the sheath was removed. Final catheter positioning was confirmed and documented with a fluoroscopic spot radiograph. The port was accessed with a Huber needle, aspirated and flushed with heparinized saline.  The venotomy site was closed with an interrupted 4-0 Vicryl suture. The port pocket incision was closed with interrupted 2-0 Vicryl suture and the skin was opposed with a running subcuticular 4-0 Vicryl suture. Dermabond and Steri-strips were applied to both incisions. Dressings were placed. The patient tolerated the procedure well without immediate post procedural complication.  FINDINGS: After catheter placement, the tip lies within the superior cavoatrial junction. The catheter aspirates and flushes normally and is ready for immediate use.  IMPRESSION: Successful placement of a right internal jugular approach power injectable Port-A-Cath. The catheter is ready for immediate use.   Electronically Signed   By: Sandi Mariscal M.D.   On: 12/03/2014 17:04   Ir US Guide Vasc Access Right  12/03/2014   INDICATION: History of tongue cancer. In need of durable intravenous access for chemotherapy administration.  EXAM: IMPLANTED PORT A CATH PLACEMENT WITH ULTRASOUND AND FLUOROSCOPIC GUIDANCE  COMPARISON:  Neck CT - 10/04/2014  MEDICATIONS: Ancef 2 gm IV; The antibiotic was administered within an appropriate time interval prior to skin puncture.  ANESTHESIA/SEDATION: Versed 3 mg IV; Fentanyl 50 mcg IV;  Total Moderate Sedation Time  20  minutes.  CONTRAST:  None  FLUOROSCOPY TIME:  28 seconds (7 mGy)  COMPLICATIONS: None immediate  PROCEDURE: The procedure, risks, benefits, and alternatives were explained to the patient. Questions regarding the procedure were encouraged and answered. The patient understands and consents to the procedure.  The right neck and chest were prepped with chlorhexidine in a sterile fashion, and a sterile drape was applied covering the operative field. Maximum barrier  sterile technique with sterile gowns and gloves were used for the procedure. A timeout was performed prior to the initiation of the procedure. Local anesthesia was provided with 1% lidocaine with epinephrine.  After creating a small venotomy incision, a micropuncture kit was utilized to access the internal jugular vein under direct, real-time ultrasound guidance. Ultrasound image documentation was performed. The microwire was kinked to measure appropriate catheter length.  A subcutaneous port pocket was then created along the upper chest wall utilizing a combination of sharp and blunt dissection. The pocket was irrigated with sterile saline. A single lumen ISP power injectable port was chosen for placement. The 8 Fr catheter was tunneled from the port pocket site to the venotomy incision. The port was placed in the pocket. The external catheter was trimmed to appropriate length. At the venotomy, an 8 Fr peel-away sheath was placed over a guidewire under fluoroscopic guidance. The catheter was then placed through the sheath and the sheath was removed. Final catheter positioning was confirmed and documented with a fluoroscopic spot radiograph. The port was accessed with a Huber needle, aspirated and flushed with heparinized saline.  The venotomy site was closed with an interrupted 4-0 Vicryl suture. The port pocket incision was closed with interrupted 2-0 Vicryl suture and the skin was opposed with a running subcuticular 4-0 Vicryl suture. Dermabond and Steri-strips were applied to both incisions. Dressings were placed. The patient tolerated the  procedure well without immediate post procedural complication.  FINDINGS: After catheter placement, the tip lies within the superior cavoatrial junction. The catheter aspirates and flushes normally and is ready for immediate use.  IMPRESSION: Successful placement of a right internal jugular approach power injectable Port-A-Cath. The catheter is ready for immediate use.    Electronically Signed   By: Sandi Mariscal M.D.   On: 12/03/2014 17:04    ASSESSMENT/PLAN:    Cancer of base of tongue [facility]) Patient has been diagnosed with tongue cancer; and presented to the [facility] today to initiate his Erbitux chemotherapy regimen.  He is also scheduled to receive his first cycle of radiation treatments today as well.  Patient received approximately half of the Erbitux infusion when he developed a hypersensitivity reaction which consisted of severe rigors, mild hypertension, and some cyanosis of his fingernail beds.  Erbitux infusion was immediately held; and patient was given Pepcid 20 mg, a Medrol 125 mg, and a total of 25 mg of Demerol IV.  Confirmed that patient had already received Benadryl 50 mg just prior to initiating the Erbitux infusion as a premedication.  All symptoms did resolve; and vital signs stabilized after a period of monitoring-and patient will be rechallenged with the Erbitux infusion per Dr. Alvy Bimler.  Patient is scheduled to return for labs and a follow-up visit on 12/10/2014.  Patient is scheduled for his next cycle of chemotherapy on 12/11/2014.      Hypersensitivity reaction Patient has been diagnosed with tongue cancer; and presented to the [facility] today to initiate his Erbitux chemotherapy regimen.  He is also scheduled to receive his first cycle of radiation treatments today as well.  Patient received approximately half of the Erbitux infusion when he developed a hypersensitivity reaction which consisted of severe rigors, mild hypertension, and some cyanosis of his fingernail beds.  Erbitux infusion was immediately held; and patient was given Pepcid 20 mg, a Medrol 125 mg, and a total of 25 mg of Demerol IV.  Confirmed that patient had already received Benadryl 50 mg just prior to initiating the Erbitux infusion as a premedication.  After a period of close observation symptoms did resolve; and vital signs stabilized. Patient was  approved for re-challenge per Dr. Alvy Bimler; and completed the rest of his chemotherapy with no further issues.   Dr. Alvy Bimler has requested that pt be pledgeted an extra 2 hours for next week's infusion of his Erbitux.  Will alert scheduling to arrange.      Patient stated understanding of all instructions; and was in agreement with this plan of care. The patient knows to call the clinic with any problems, questions or concerns.   Review/collaboration with Dr. Alvy Bimler regarding all aspects of patient's visit today.   Total time spent with patient was 40 minutes;  with greater than 75 percent of that time spent in face to face counseling regarding patient's symptoms,  and coordination of care and follow up.  Disclaimer:This dictation was prepared with Dragon/digital dictation along with [employer]. Any transcriptional errors that result from this process are unintentional.  Drue Second, NP 12/04/2014

## 2014-12-04 NOTE — Assessment & Plan Note (Addendum)
Patient has been diagnosed with tongue cancer; and presented to the [facility] today to initiate his Erbitux chemotherapy regimen.  He is also scheduled to receive his first cycle of radiation treatments today as well.  Patient received approximately half of the Erbitux infusion when he developed a hypersensitivity reaction which consisted of severe rigors, mild hypertension, and some cyanosis of his fingernail beds.  Erbitux infusion was immediately held; and patient was given Pepcid 20 mg, a Medrol 125 mg, and a total of 25 mg of Demerol IV.  Confirmed that patient had already received Benadryl 50 mg just prior to initiating the Erbitux infusion as a premedication.  After a period of close observation symptoms did resolve; and vital signs stabilized. Patient was approved for re-challenge per Dr. Alvy Bimler; and completed the rest of his chemotherapy with no further issues.   Dr. Alvy Bimler has requested that pt be pledgeted an extra 2 hours for next week's infusion of his Erbitux.  Will alert scheduling to arrange.

## 2014-12-04 NOTE — Progress Notes (Signed)
Patient started experiencing an adverse reaction approximately one hour into Erbitux infusion.  He began with severe rigors and complained of neck pain on both sides, nail beds were cyanotic.  Selena Lesser to chair side and hypersensitivity meds administered per protocol, see MAR.  Symptoms subsided within about 20 minutes.

## 2014-12-04 NOTE — Progress Notes (Signed)
Nutrition follow-up completed with patient and daughter in infusion. Weight has decreased and was documented as 166 pounds October 4 down from 170 pounds September 21. Patient reports he is not able to chew but he can swallow anything. Reports he drinks approximately 3 Ensure Plus a day and continues to drink Coca-Cola and V-8 juice.  Patient does not enjoy drinking water. Feeding tube is flushing without difficulty. Patient has difficulty giving himself feedings or free water through the tube. Reports he does not like being a burden to his daughter.  Estimated nutrition needs: 2200-2400 calories, 110-125 grams protein, 2.4 L fluid.  Nutrition diagnosis: Inadequate oral intake continues.  Intervention:  Educated patient to increase Ensure Plus by mouth to 6 bottles daily along with 3 meals of soft foods as tolerated. Encouraged patient to increase water by mouth or tube to to a minimum of 32 oz a day. Suggested patient could flush 240 cc of water through feeding tube once a day. Teach back method used.  Monitoring, evaluation, goals: Patient will increase oral intake or feedings by tube to minimize weight loss.  He will also increase free water by mouth or via feeding tube.  Next visit: Wednesday, October 19, during infusion.  **Disclaimer: This note was dictated with voice recognition software. Similar sounding words can inadvertently be transcribed and this note may contain transcription errors which may not have been corrected upon publication of note.**

## 2014-12-04 NOTE — Assessment & Plan Note (Addendum)
Patient has been diagnosed with tongue cancer; and presented to the [facility] today to initiate his Erbitux chemotherapy regimen.  He is also scheduled to receive his first cycle of radiation treatments today as well.  Patient received approximately half of the Erbitux infusion when he developed a hypersensitivity reaction which consisted of severe rigors, mild hypertension, and some cyanosis of his fingernail beds.  Erbitux infusion was immediately held; and patient was given Pepcid 20 mg, a Medrol 125 mg, and a total of 25 mg of Demerol IV.  Confirmed that patient had already received Benadryl 50 mg just prior to initiating the Erbitux infusion as a premedication.  All symptoms did resolve; and vital signs stabilized after a period of monitoring-and patient will be rechallenged with the Erbitux infusion per Dr. Alvy Bimler.  Patient is scheduled to return for labs and a follow-up visit on 12/10/2014.  Patient is scheduled for his next cycle of chemotherapy on 12/11/2014.

## 2014-12-04 NOTE — Patient Instructions (Signed)
[facility] Discharge Instructions for Patients Receiving Chemotherapy  Today you received the following chemotherapy agents:Eributux   To help prevent nausea and vomiting after your treatment, we encourage you to take your nausea medication as directed   If you develop nausea and vomiting that is not controlled by your nausea medication, call the clinic.   BELOW ARE SYMPTOMS THAT SHOULD BE REPORTED IMMEDIATELY:  *FEVER GREATER THAN 100.5 F  *CHILLS WITH OR WITHOUT FEVER  NAUSEA AND VOMITING THAT IS NOT CONTROLLED WITH YOUR NAUSEA MEDICATION  *UNUSUAL SHORTNESS OF BREATH  *UNUSUAL BRUISING OR BLEEDING  TENDERNESS IN MOUTH AND THROAT WITH OR WITHOUT PRESENCE OF ULCERS  *URINARY PROBLEMS  *BOWEL PROBLEMS  UNUSUAL RASH Items with * indicate a potential emergency and should be followed up as soon as possible.  Feel free to call the clinic you have any questions or concerns. The clinic phone number is [phone].  Please show the [place] at check-in to the Emergency Department and triage nurse.  Cetuximab injection What is this medicine? CETUXIMAB (se TUX i mab) is a monoclonal antibody. It is used to treat colorectal cancer and head and neck cancer. This medicine may be used for other purposes; ask your health care provider or pharmacist if you have questions. What should I tell my health care provider before I take this medicine? They need to know if you have any of these conditions: -heart disease -history of irregular heartbeat -history of low levels of calcium, magnesium, or potassium in the blood -lung or breathing disease, like asthma -an unusual or allergic reaction to cetuximab, other medicines, foods, dyes, or preservatives -pregnant or trying to get pregnant -breast-feeding How should I use this medicine? This drug is given as an infusion into a vein. It is administered in a hospital or clinic by a specially trained health care  professional. Talk to your pediatrician regarding the use of this medicine in children. Special care may be needed. Overdosage: If you think you have taken too much of this medicine contact a poison control center or emergency room at once. NOTE: This medicine is only for you. Do not share this medicine with others. What if I miss a dose? It is important not to miss your dose. Call your doctor or health care professional if you are unable to keep an appointment. What may interact with this medicine? Interactions are not expected. This list may not describe all possible interactions. Give your health care provider a list of all the medicines, herbs, non-prescription drugs, or dietary supplements you use. Also tell them if you smoke, drink alcohol, or use illegal drugs. Some items may interact with your medicine. What should I watch for while using this medicine? Visit your doctor or health care professional for regular checks on your progress. This drug may make you feel generally unwell. This is not uncommon, as chemotherapy can affect healthy cells as well as cancer cells. Report any side effects. Continue your course of treatment even though you feel ill unless your doctor tells you to stop. This medicine can make you more sensitive to the sun. Keep out of the sun while taking this medicine and for 2 months after the last dose. If you cannot avoid being in the sun, wear protective clothing and use sunscreen. Do not use sun lamps or tanning beds/booths. You may need blood work done while you are taking this medicine. In some cases, you may be given additional medicines to help with side  effects. Follow all directions for their use. Call your doctor or health care professional for advice if you get a fever, chills or sore throat, or other symptoms of a cold or flu. Do not treat yourself. This drug decreases your body's ability to fight infections. Try to avoid being around people who are sick. Avoid  taking products that contain aspirin, acetaminophen, ibuprofen, naproxen, or ketoprofen unless instructed by your doctor. These medicines may hide a fever. Do not become pregnant while taking this medicine. Women should inform their doctor if they wish to become pregnant or think they might be pregnant. There is a potential for serious side effects to an unborn child. Use adequate birth control methods. Avoid pregnancy for at least 6 months after your last dose. Talk to your health care professional or pharmacist for more information. Do not breast-feed an infant while taking this medicine or during the 2 months after your last dose. What side effects may I notice from receiving this medicine? Side effects that you should report to your doctor or health care professional as soon as possible: -allergic reactions like skin rash, itching or hives, swelling of the face, lips, or tongue -breathing problems -changes in vision -fast, irregular heartbeat -feeling faint or lightheaded, falls -fever, chills -mouth sores -redness, blistering, peeling or loosening of the skin, including inside the mouth -trouble passing urine or change in the amount of urine -unusually weak or tired Side effects that usually do not require medical attention (report to your doctor or health care professional if they continue or are bothersome): -changes in skin like acne, cracks, skin dryness -constipation -diarrhea -headache -nail changes -nausea, vomiting -stomach upset -weight loss This list may not describe all possible side effects. Call your doctor for medical advice about side effects. You may report side effects to FDA at 1-800-FDA-1088. Where should I keep my medicine? This drug is given in a hospital or clinic and will not be stored at home. NOTE: This sheet is a summary. It may not cover all possible information. If you have questions about this medicine, talk to your doctor, pharmacist, or health care  provider.    2016, Elsevier/Gold Standard. (2014-04-24 22:27:08)

## 2014-12-05 ENCOUNTER — Other Ambulatory Visit: Payer: Self-pay | Admitting: Hematology and Oncology

## 2014-12-05 ENCOUNTER — Telehealth: Payer: Self-pay | Admitting: *Deleted

## 2014-12-05 ENCOUNTER — Encounter: Payer: Self-pay | Admitting: *Deleted

## 2014-12-05 ENCOUNTER — Encounter: Payer: Self-pay | Admitting: Radiation Oncology

## 2014-12-05 ENCOUNTER — Ambulatory Visit
Admission: RE | Admit: 2014-12-05 | Discharge: 2014-12-05 | Disposition: A | Discharge status: Home / Self Care | Payer: Non-veteran care | Source: Ambulatory Visit | Attending: Radiation Oncology | Admitting: Radiation Oncology

## 2014-12-05 DIAGNOSIS — Z510 Encounter for antineoplastic radiation therapy: Secondary | ICD-10-CM | POA: Diagnosis not present

## 2014-12-05 DIAGNOSIS — C01 Malignant neoplasm of base of tongue: Secondary | ICD-10-CM

## 2014-12-05 MED ORDER — PROCHLORPERAZINE MALEATE 10 MG PO TABS: 10.0000 mg | ORAL_TABLET | Freq: Four times a day (QID) | ORAL | Status: DC | PRN

## 2014-12-05 MED ORDER — ONDANSETRON HCL 8 MG PO TABS: 8.0000 mg | ORAL_TABLET | Freq: Three times a day (TID) | ORAL | Status: DC | PRN

## 2014-12-05 NOTE — Telephone Encounter (Signed)
[name] RN Navigator saw patient today while he was in RT. States he is doing well post chemo. Denies N/V or diarrhea

## 2014-12-05 NOTE — Telephone Encounter (Signed)
-----   Message from Noreene Larsson, RN sent at 12/04/2014  6:13 PM EDT ----- Regarding: Kenneth Jordan follow up Pt of Dr. Alvy Bimler. 1st erbitux today, had a reaction with rigors. Resolved with reaction meds. Treatment restarted with no further issues.

## 2014-12-05 NOTE — Telephone Encounter (Signed)
Scheduling advised to allot for 5 hours for pt treatment to allow for a slower infusion rate due to reaction.

## 2014-12-05 NOTE — Progress Notes (Signed)
  Oncology Nurse Navigator Documentation   Navigator Encounter Type: Treatment (12/05/14 1455) Patient Visit Type: LMRAJH (12/05/14 1455) Treatment Phase: First Radiation Tx (12/05/14 1455)     To offer support and encouragement, met with patient during [place].  His dtr Kenneth Jordan accompanied him. He denied any SEs or concerns from yesterday's chemo rxt. I answered his dtr's questions re chemo SEs, learned he did not have Rx for antiemetics.  I later notified Dr. Alvy Bimler. He tolerated RT without incident.  Kenneth Orem, RN, BSN, [place] at [place] [phone]                 Time Spent with Patient: 30 (12/05/14 1455)

## 2014-12-06 ENCOUNTER — Encounter: Payer: Self-pay | Admitting: *Deleted

## 2014-12-06 ENCOUNTER — Ambulatory Visit
Admission: RE | Admit: 2014-12-06 | Discharge: 2014-12-06 | Disposition: A | Discharge status: Home / Self Care | Payer: Non-veteran care | Source: Ambulatory Visit | Attending: Radiation Oncology | Admitting: Radiation Oncology

## 2014-12-06 DIAGNOSIS — Z510 Encounter for antineoplastic radiation therapy: Secondary | ICD-10-CM | POA: Diagnosis not present

## 2014-12-06 NOTE — Progress Notes (Signed)
  Oncology Nurse Navigator Documentation   Navigator Encounter Type: Treatment (12/04/14 1520) Patient Visit Type: Medonc (12/04/14 1520) Treatment Phase: Other (12/04/14 1520)     Check on patient in Infusion to escort to [facility]. Learned patient had rxt to Erbitux earlier, tmt had just been reinitiated, he was not expected to finish in time for RT. I informed Tomo, Dr. Isidore Moos.  [name], RN, BSN, [place] at [place] [phone]                 Time Spent with Patient: 15 (12/04/14 1520)

## 2014-12-06 NOTE — Progress Notes (Signed)
  Oncology Nurse Navigator Documentation   Navigator Encounter Type: Other (12/06/14 1420)         Interventions: Medication assitance (12/06/14 1420)       Provided patient's dtr Rxs issued by Dr. Alvy Bimler for Zofran and Compazine.  I explained [facility] had [facility] to [facility] but I had not received a reply yet. She indicated she would fill at [facility] or her pharmacy.  [name], RN, BSN, [place] at [place] [phone]         Time Spent with Patient: 15 (12/06/14 1420)

## 2014-12-06 NOTE — Progress Notes (Signed)
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 Cancer of base of tongue 141.0 C01    8.7 delivered field widths represent one set of IMRT treatment devices. The code is [phone].  -----------------------------------  Eppie Gibson, MD

## 2014-12-06 NOTE — Progress Notes (Signed)
  Oncology Nurse Navigator Documentation   Navigator Encounter Type: Treatment (12/04/14 1215) Patient Visit Type: Medonc (12/04/14 1215) Treatment Phase: Final Chemo TX (12/04/14 1215)     Visited patient in Infusion to provide support and encouragement during first Erbitux.  His dtr Georgina Peer was with him. He reported "doing fine". He understands I will be joining him during first [place] later in the afternoon.  [name], RN, BSN, [place] at [place] [phone]                 Time Spent with Patient: 15 (12/04/14 1215)

## 2014-12-09 ENCOUNTER — Encounter: Payer: Self-pay | Admitting: *Deleted

## 2014-12-09 ENCOUNTER — Encounter: Payer: Self-pay | Admitting: Radiation Oncology

## 2014-12-09 ENCOUNTER — Ambulatory Visit
Admission: RE | Admit: 2014-12-09 | Discharge: 2014-12-09 | Disposition: A | Discharge status: Home / Self Care | Payer: Non-veteran care | Source: Ambulatory Visit | Attending: Radiation Oncology | Admitting: Radiation Oncology

## 2014-12-09 VITALS — BP 103/55 | HR 95 | Temp 98.1°F | Ht 69.0 in | Wt 168.4 lb

## 2014-12-09 DIAGNOSIS — Z510 Encounter for antineoplastic radiation therapy: Secondary | ICD-10-CM | POA: Diagnosis not present

## 2014-12-09 DIAGNOSIS — C01 Malignant neoplasm of base of tongue: Secondary | ICD-10-CM

## 2014-12-09 MED ORDER — SUCRALFATE 1 G PO TABS: ORAL_TABLET | ORAL | Status: DC

## 2014-12-09 MED ORDER — LIDOCAINE VISCOUS 2 % MT SOLN: OROMUCOSAL | Status: DC

## 2014-12-09 NOTE — Progress Notes (Signed)
   Weekly Management Note:  Outpatient    ICD-9-CM ICD-10-CM   1. Cancer of base of tongue (HCC) 141.0 C01     Current Dose:  6 Gy  Projected Dose: 70 Gy   Narrative:  The patient presents for routine under treatment assessment.  CBCT/MVCT images/Port film x-rays were reviewed.  The chart was checked.  He reports occipital headaches improved by head massage.  Left ear pain.  Tingling in parts of the body since ERBITUX started.  Physical Findings:  Wt Readings from Last 3 Encounters:  12/09/14 168 lb 6.4 oz (76.386 kg)  12/03/14 166 lb (75.297 kg)  12/02/14 166 lb 4.8 oz (75.433 kg)    height is 5\' 9"  (1.753 m) and weight is 168 lb 6.4 oz (76.386 kg). His temperature is 98.1 F (36.7 C). His blood pressure is 103/55 and his pulse is 95.  no mucositis in mouth, Tympanic membranes unremarkable, skin intact  CBC    Component Value Date/Time   WBC 8.7 12/02/2014 1147   WBC 8.4 11/18/2014 1100   RBC 4.76 12/02/2014 1147   RBC 4.57 11/18/2014 1100   HGB 12.3* 12/02/2014 1147   HGB 12.0* 11/18/2014 1100   HCT 38.3* 12/02/2014 1147   HCT 37.7* 11/18/2014 1100   PLT 387 12/02/2014 1147   PLT 352 11/18/2014 1100   MCV 80.3 12/02/2014 1147   MCV 82.5 11/18/2014 1100   MCH 25.8* 12/02/2014 1147   MCH 26.3 11/18/2014 1100   MCHC 32.1 12/02/2014 1147   MCHC 31.8 11/18/2014 1100   RDW 15.0* 12/02/2014 1147   RDW 13.7 11/18/2014 1100   LYMPHSABS 1.6 12/02/2014 1147   MONOABS 0.7 12/02/2014 1147   EOSABS 0.2 12/02/2014 1147   BASOSABS 0.1 12/02/2014 1147     CMP     Component Value Date/Time   NA 140 12/02/2014 1147   NA 134* 11/18/2014 1100   K 4.7 12/02/2014 1147   K 5.3* 11/18/2014 1100   CL 101 11/18/2014 1100   CO2 27 12/02/2014 1147   CO2 26 11/18/2014 1100   GLUCOSE 87 12/02/2014 1147   GLUCOSE 89 11/18/2014 1100   BUN 20.4 12/02/2014 1147   BUN 26* 11/18/2014 1100   CREATININE 1.2 12/02/2014 1147   CREATININE 1.44* 11/18/2014 1100   CALCIUM 9.5 12/02/2014 1147     CALCIUM 9.2 11/18/2014 1100   PROT 7.0 12/02/2014 1147   ALBUMIN 3.4* 12/02/2014 1147   AST 13 12/02/2014 1147   ALT <9 12/02/2014 1147   ALKPHOS 53 12/02/2014 1147   BILITOT 0.30 12/02/2014 1147   GFRNONAA 45* 11/18/2014 1100   GFRAA 53* 11/18/2014 1100     Impression:  The patient is tolerating radiotherapy.   Plan:  Continue radiotherapy as planned. No acute effects from RT so far. Sucralfate and lidocaine Rx given for the future.  -----------------------------------  Eppie Gibson, MD

## 2014-12-10 ENCOUNTER — Ambulatory Visit (HOSPITAL_BASED_OUTPATIENT_CLINIC_OR_DEPARTMENT_OTHER): Payer: Non-veteran care | Admitting: Hematology and Oncology

## 2014-12-10 ENCOUNTER — Telehealth: Payer: Self-pay | Admitting: Hematology and Oncology

## 2014-12-10 ENCOUNTER — Ambulatory Visit
Admission: RE | Admit: 2014-12-10 | Discharge: 2014-12-10 | Disposition: A | Discharge status: Home / Self Care | Payer: Non-veteran care | Source: Ambulatory Visit | Attending: Radiation Oncology | Admitting: Radiation Oncology

## 2014-12-10 ENCOUNTER — Other Ambulatory Visit (HOSPITAL_BASED_OUTPATIENT_CLINIC_OR_DEPARTMENT_OTHER): Payer: Non-veteran care

## 2014-12-10 ENCOUNTER — Encounter: Payer: Self-pay | Admitting: *Deleted

## 2014-12-10 ENCOUNTER — Telehealth: Payer: Self-pay | Admitting: *Deleted

## 2014-12-10 ENCOUNTER — Encounter: Payer: Self-pay | Admitting: Hematology and Oncology

## 2014-12-10 VITALS — BP 123/58 | HR 82 | Temp 98.2°F | Resp 20 | Ht 69.0 in | Wt 166.5 lb

## 2014-12-10 DIAGNOSIS — R070 Pain in throat: Secondary | ICD-10-CM | POA: Diagnosis not present

## 2014-12-10 DIAGNOSIS — R21 Rash and other nonspecific skin eruption: Secondary | ICD-10-CM

## 2014-12-10 DIAGNOSIS — C01 Malignant neoplasm of base of tongue: Secondary | ICD-10-CM

## 2014-12-10 DIAGNOSIS — I952 Hypotension due to drugs: Secondary | ICD-10-CM | POA: Diagnosis not present

## 2014-12-10 DIAGNOSIS — Z510 Encounter for antineoplastic radiation therapy: Secondary | ICD-10-CM | POA: Diagnosis not present

## 2014-12-10 DIAGNOSIS — Z09 Encounter for follow-up examination after completed treatment for conditions other than malignant neoplasm: Secondary | ICD-10-CM

## 2014-12-10 DIAGNOSIS — T7840XS Allergy, unspecified, sequela: Secondary | ICD-10-CM

## 2014-12-10 LAB — CBC WITH DIFFERENTIAL/PLATELET
BASO%: 0.6 % (ref 0.0–2.0)
Basophils Absolute: 0.1 10*3/uL (ref 0.0–0.1)
EOS ABS: 0.2 10*3/uL (ref 0.0–0.5)
EOS%: 2.6 % (ref 0.0–7.0)
HCT: 41.9 % (ref 38.4–49.9)
HEMOGLOBIN: 13.4 g/dL (ref 13.0–17.1)
LYMPH%: 16.2 % (ref 14.0–49.0)
MCH: 25.3 pg — ABNORMAL LOW (ref 27.2–33.4)
MCHC: 31.9 g/dL — ABNORMAL LOW (ref 32.0–36.0)
MCV: 79.3 fL (ref 79.3–98.0)
MONO#: 0.8 10*3/uL (ref 0.1–0.9)
MONO%: 9.2 % (ref 0.0–14.0)
NEUT%: 71.4 % (ref 39.0–75.0)
NEUTROS ABS: 6.1 10*3/uL (ref 1.5–6.5)
Platelets: 387 10*3/uL (ref 140–400)
RBC: 5.28 10*6/uL (ref 4.20–5.82)
RDW: 15.4 % — AB (ref 11.0–14.6)
WBC: 8.5 10*3/uL (ref 4.0–10.3)
lymph#: 1.4 10*3/uL (ref 0.9–3.3)

## 2014-12-10 LAB — COMPREHENSIVE METABOLIC PANEL (CC13)
ALK PHOS: 70 U/L (ref 40–150)
ALT: 10 U/L (ref 0–55)
ANION GAP: 8 meq/L (ref 3–11)
AST: 16 U/L (ref 5–34)
Albumin: 3.3 g/dL — ABNORMAL LOW (ref 3.5–5.0)
BUN: 25.1 mg/dL (ref 7.0–26.0)
CALCIUM: 9.9 mg/dL (ref 8.4–10.4)
CHLORIDE: 102 meq/L (ref 98–109)
CO2: 28 mEq/L (ref 22–29)
CREATININE: 1.1 mg/dL (ref 0.7–1.3)
EGFR: 67 mL/min/{1.73_m2} — ABNORMAL LOW (ref >90)
Glucose: 78 mg/dl (ref 70–140)
Potassium: 5.6 mEq/L — ABNORMAL HIGH (ref 3.5–5.1)
Sodium: 139 mEq/L (ref 136–145)
Total Protein: 7.2 g/dL (ref 6.4–8.3)

## 2014-12-10 LAB — MAGNESIUM (CC13): MAGNESIUM: 2.2 mg/dL (ref 1.5–2.5)

## 2014-12-10 MED ORDER — CLINDAMYCIN PHOSPHATE 1 % EX GEL: Freq: Two times a day (BID) | CUTANEOUS | Status: DC

## 2014-12-10 MED ORDER — MORPHINE SULFATE (CONCENTRATE) 20 MG/ML PO SOLN: 10.0000 mg | ORAL | Status: DC | PRN

## 2014-12-10 MED ORDER — HYDROCORTISONE 1 % EX OINT: 1.0000 "application " | TOPICAL_OINTMENT | Freq: Two times a day (BID) | CUTANEOUS | Status: DC

## 2014-12-10 NOTE — Assessment & Plan Note (Signed)
This is related to recent weight loss. I recommend stopping metoprolol

## 2014-12-10 NOTE — Assessment & Plan Note (Signed)
The skin rash is related to side effects of Erbitux. I will prescribe topical cream as above.

## 2014-12-10 NOTE — Progress Notes (Signed)
[facility] OFFICE PROGRESS NOTE  Patient Care Team: Pcp Not In System as PCP - General Leota Sauers, RN as Oncology Nurse Navigator Heath Lark, MD as Consulting Physician (Hematology and Oncology) Eppie Gibson, MD as Attending Physician (Radiation Oncology) Karie Mainland, RD as Dietitian (Nutrition)  SUMMARY OF ONCOLOGIC HISTORY: Oncology History   Cancer of base of tongue   Staging form: Lip and Oral Cavity, AJCC 7th Edition     Clinical stage from 11/08/2014: Stage IVA (T4a, N2c, M0) - Signed by Heath Lark, MD on 11/08/2014       Cancer of base of tongue ([place])   07/08/2014 Miscellaneous  the patient noted tongue swelling / mass   10/04/2014 Imaging  CT scan done at the [facility] show large tongue mass with bilateral neck lymphadenopathy   10/07/2014 Miscellaneous  he was seen at the [facility] for evaluation was noted to have tongue cancer   10/10/2014 Imaging  PET CT scan from the [facility] showed extensive regional lymphadenopathy on the left side including supraclavicular lymph node involvement and possibly right jugulodigastric lymph node involvement.   10/24/2014 Procedure  he underwent laryngoscopy and biopsy. there is a large ulcerative tongue a mass on the left involving the tonsil, soft palate, epiglottis.   10/24/2014 Pathology Results  pathology report from [facility] show squamous cell carcinoma with basaloid feature, P 16 positive by PCR   11/20/2014 Surgery he had multiple dental extractions   12/03/2014 Procedure he has port placement   12/04/2014 -  Chemotherapy He received weekly Erbitux   12/04/2014 -  Radiation Therapy he received radiation treatment   12/04/2014 Adverse Reaction he had infusion reaction from Eribitux    INTERVAL HISTORY: Please see below for problem oriented charting. He is seen prior to cycle 2. He is not able to swallow much and is started on tube feeding. He is not drinking any water. He complained of dizziness. He denies hemoptysis. He had diffuse skin  rash since treatment last week. He denies any nausea, diarrhea or constipation. He complained of throat/neck pain, rated his pain at 4 out of 10. The prescription Hycet is not helping.  REVIEW OF SYSTEMS:   Constitutional: Denies fevers, chills or abnormal weight loss Eyes: Denies blurriness of vision Respiratory: Denies cough, dyspnea or wheezes Cardiovascular: Denies palpitation, chest discomfort or lower extremity swelling Gastrointestinal:  Denies nausea, heartburn or change in bowel habits Lymphatics: Denies new lymphadenopathy or easy bruising Neurological:Denies numbness, tingling or new weaknesses Behavioral/Psych: Mood is stable, no new changes  All other systems were reviewed with the patient and are negative.  I have reviewed the past medical history, past surgical history, social history and family history with the patient and they are unchanged from previous note.  ALLERGIES:  is allergic to other.  MEDICATIONS:  Current Outpatient Prescriptions  Medication Sig Dispense Refill  . fluticasone (FLONASE) 50 MCG/ACT nasal spray Place 1 spray into the nose every morning.     Marland Kitchen HYDROcodone-acetaminophen (HYCET) 7.5-325 mg/15 ml solution Take 15 mLs by mouth every 6 (six) hours as needed for moderate pain. 180 mL 0  . lidocaine (XYLOCAINE) 2 % solution Mix 1 part 2%viscous lidocaine,1part H2O.Swish and/or swallow 8mL of this mixture,91min before meals and at bedtime, up to QID 100 mL 5  . lidocaine-prilocaine (EMLA) cream Apply to affected area once 30 g 3  . loratadine (CLARITIN) 10 MG tablet Take 10 mg by mouth daily as needed for allergies.    . Menthol, Topical  Analgesic, 16 % LIQD Apply 1 application topically daily. Shoulder pain.    Marland Kitchen omeprazole (PRILOSEC) 20 MG capsule Take 20 mg by mouth at bedtime.     . ondansetron (ZOFRAN) 8 MG tablet Take 1 tablet (8 mg total) by mouth every 8 (eight) hours as needed for nausea. 30 tablet 3  . prochlorperazine (COMPAZINE) 10 MG  tablet Take 1 tablet (10 mg total) by mouth every 6 (six) hours as needed. 30 tablet 3  . sodium fluoride (FLUORISHIELD) 1.1 % GEL dental gel Instill one drop of gel per tooth space of fluoride tray. Place over teeth for 5 minutes. Remove. Spit out excess. Repeat nightly 120 mL prn  . sucralfate (CARAFATE) 1 G tablet Dissolve 1 tablet in 7mL H2O and swallow up to QID,PRN sore throat. 60 tablet 5  . zolpidem (AMBIEN) 10 MG tablet Take 5 mg by mouth at bedtime.     . clindamycin (CLINDAGEL) 1 % gel Apply topically 2 (two) times daily. 60 g 2  . hydrocortisone 1 % ointment Apply 1 application topically 2 (two) times daily. 1 large tube 56 g 0  . morphine (ROXANOL) 20 MG/ML concentrated solution Place 0.5 mLs (10 mg total) into feeding tube every 2 (two) hours as needed for severe pain. 240 mL 0   No current facility-administered medications for this visit.    PHYSICAL EXAMINATION: ECOG PERFORMANCE STATUS: 1 - Symptomatic but completely ambulatory  Filed Vitals:   12/10/14 1442  BP: 123/58  Pulse: 82  Temp: 98.2 F (36.8 C)  Resp: 20   Filed Weights   12/10/14 1442  Weight: 166 lb 8 oz (75.524 kg)    GENERAL:alert, no distress and comfortable SKIN: he has diffuse skin rash all over his body consistent with Erbitux rash EYES: normal, Conjunctiva are pink and non-injected, sclera clear OROPHARYNX:he has slurring of speech with enlarged tongue, grade 1 mucositis without thrush NECK: supple, thyroid normal size, non-tender, without nodularity LYMPH: previously palpable lymphadenopathy in the left side of the neck has regressed in size LUNGS: clear to auscultation and percussion with normal breathing effort HEART: regular rate & rhythm and no murmurs and no lower extremity edema ABDOMEN:abdomen soft, non-tender and normal bowel sounds. Feeding tube site looks okay Musculoskeletal:no cyanosis of digits and no clubbing . Noted bruising over his right chest wall NEURO: alert & oriented x 3  with slurred speech, poor hearing, no focal motor/sensory deficits  LABORATORY DATA:  I have reviewed the data as listed    Component Value Date/Time   NA 140 12/02/2014 1147   NA 134* 11/18/2014 1100   K 4.7 12/02/2014 1147   K 5.3* 11/18/2014 1100   CL 101 11/18/2014 1100   CO2 27 12/02/2014 1147   CO2 26 11/18/2014 1100   GLUCOSE 87 12/02/2014 1147   GLUCOSE 89 11/18/2014 1100   BUN 20.4 12/02/2014 1147   BUN 26* 11/18/2014 1100   CREATININE 1.2 12/02/2014 1147   CREATININE 1.44* 11/18/2014 1100   CALCIUM 9.5 12/02/2014 1147   CALCIUM 9.2 11/18/2014 1100   PROT 7.0 12/02/2014 1147   ALBUMIN 3.4* 12/02/2014 1147   AST 13 12/02/2014 1147   ALT <9 12/02/2014 1147   ALKPHOS 53 12/02/2014 1147   BILITOT 0.30 12/02/2014 1147   GFRNONAA 45* 11/18/2014 1100   GFRAA 53* 11/18/2014 1100    No results found for: SPEP, UPEP  Lab Results  Component Value Date   WBC 8.5 12/10/2014   NEUTROABS 6.1 12/10/2014   HGB  13.4 12/10/2014   HCT 41.9 12/10/2014   MCV 79.3 12/10/2014   PLT 387 12/10/2014      Chemistry      Component Value Date/Time   NA 140 12/02/2014 1147   NA 134* 11/18/2014 1100   K 4.7 12/02/2014 1147   K 5.3* 11/18/2014 1100   CL 101 11/18/2014 1100   CO2 27 12/02/2014 1147   CO2 26 11/18/2014 1100   BUN 20.4 12/02/2014 1147   BUN 26* 11/18/2014 1100   CREATININE 1.2 12/02/2014 1147   CREATININE 1.44* 11/18/2014 1100      Component Value Date/Time   CALCIUM 9.5 12/02/2014 1147   CALCIUM 9.2 11/18/2014 1100   ALKPHOS 53 12/02/2014 1147   AST 13 12/02/2014 1147   ALT <9 12/02/2014 1147   BILITOT 0.30 12/02/2014 1147     ASSESSMENT & PLAN:  Cancer of base of tongue ([place]) The patient had infusion reaction with cycle 1. We will continue premedication prior to cycle 2. He also developed diffuse skin rash, grade 1 dermatitis from treatment. I recommend topical steroid cream for now. If he start to develop into acneform rash, he will start on  clindamycin gel.  Drug-induced hypotension This is related to recent weight loss. I recommend stopping metoprolol  S/P gastrostomy tube (G tube) placement, follow-up exam The feeding tube site looks okay without signs of infection. I encouraged the patient to use his feeding tube frequently for nutrition with additional water flushes.   Hypersensitivity reaction He developed hypersensitivity reaction with cycle 1 of treatment. This is not a contraindication to proceed with cycle 2.  Rash, skin The skin rash is related to side effects of Erbitux. I will prescribe topical cream as above.  Throat pain in adult I will discontinue Hycet and switch him to liquid morphine. I will reassess pain next week.   No orders of the defined types were placed in this encounter.   All questions were answered. The patient knows to call the clinic with any problems, questions or concerns. No barriers to learning was detected. I spent 30 minutes counseling the patient face to face. The total time spent in the appointment was 40 minutes and more than 50% was on counseling and review of test results     [facility], [place], MD 12/10/2014 3:25 PM

## 2014-12-10 NOTE — Telephone Encounter (Signed)
Added f/u appointments per 10/11 pof. Other appointments remain the same. Gave patient avs report and appointments for October and November.

## 2014-12-10 NOTE — Assessment & Plan Note (Signed)
I will discontinue Hycet and switch him to liquid morphine. I will reassess pain next week.

## 2014-12-10 NOTE — Progress Notes (Signed)
  Oncology Nurse Navigator Documentation   Navigator Encounter Type: Clinic/MDC (12/10/14 1425) Patient Visit Type: Medonc (12/10/14 1425)     To provide continuity of care, met with patient during est pt appt with Dr. Alvy Bimler.  Dtr [name] accompanied him. He verbalized understanding of Dr. Calton Dach guidance to:  Stop taking Lopressor.  Increase nutrition and hydration, minimally 6 large glasses of water daily.  I suggested placing 6 bottles of water out each day as visual reminder of his daily goal.  He stated he thinks he can drink most rather than place in tube. He verbalized understanding that daily IVF is next step if he does not maintain hydration. He stated he relies on [facility] for PEG instillations in part because of difficulty in attaching PEG extension.    I encouraged him to leave extension attached so he self administer supplement and water. He stated he would give it a try.  [name], RN, BSN, [place] at [place] [phone]                   Time Spent with Patient: 45 (12/10/14 1425)

## 2014-12-10 NOTE — Telephone Encounter (Signed)
  Oncology Nurse Navigator Documentation   Navigator Encounter Type: Telephone (12/10/14 0940)         Interventions: Medication assitance (12/10/14 0940)     Informed patient's dtr, [name], that I received call from [facility] with [place] with: 1. Instruction to fax [phone]) Rx to ATTN: Dr. [name] (his PCP).for Rx issued by [facility] physicians.  Dr. [name] will in turn order Rx for shipment.  [name] understands to bring Rx recently issued by Drs. Alvy Bimler and [place] today during scheduled appts for my processing. 2. Phone ext [phone] for RN for future assistance when needed.  [name], RN, BSN, [place] at [place] [phone]           Time Spent with Patient: 15 (12/10/14 0940)

## 2014-12-10 NOTE — Assessment & Plan Note (Signed)
He developed hypersensitivity reaction with cycle 1 of treatment. This is not a contraindication to proceed with cycle 2.

## 2014-12-10 NOTE — Assessment & Plan Note (Signed)
The feeding tube site looks okay without signs of infection. I encouraged the patient to use his feeding tube frequently for nutrition with additional water flushes.

## 2014-12-10 NOTE — Progress Notes (Signed)
  Oncology Nurse Navigator Documentation   Navigator Encounter Type: Clinic/MDC;Treatment (12/09/14 1520) Patient Visit Type: Radonc (12/09/14 1520)     Met with patient during scheduled Tomo and WUT with Dr. Isidore Moos.  His dtr [name] accompanied him. [name] informed he is instilling about 2-3 cans supplement daily; goal is 6 cans.  "He's just stubborn, won't listen to me."  I educated importance of nutrition/hydration during tmt, encouraged him to schedule 1 1/2 can 4 times daily.   We discussed his low BP in context of orthostatic VS, previous guidance from Dr. Alvy Bimler to reduce metoprolol from 1/2 tab BID to 1/2 tab daily.    He expressed uncertainty re compliance with new regime, I encouraged use of pill box for daily medications (per dtr but he refuses to use).    He reported BMs every couple days which is norm, taking Miralax daily.  They understand I can be contacted with needs/concerns.  [name], RN, BSN, [place] at [place] [phone]                   Time Spent with Patient: 60 (12/09/14 1520)

## 2014-12-10 NOTE — Assessment & Plan Note (Signed)
The patient had infusion reaction with cycle 1. We will continue premedication prior to cycle 2. He also developed diffuse skin rash, grade 1 dermatitis from treatment. I recommend topical steroid cream for now. If he start to develop into acneform rash, he will start on clindamycin gel.

## 2014-12-11 ENCOUNTER — Other Ambulatory Visit: Payer: Self-pay | Admitting: Hematology and Oncology

## 2014-12-11 ENCOUNTER — Ambulatory Visit
Admission: RE | Admit: 2014-12-11 | Discharge: 2014-12-11 | Disposition: A | Discharge status: Home / Self Care | Payer: Non-veteran care | Source: Ambulatory Visit | Attending: Radiation Oncology | Admitting: Radiation Oncology

## 2014-12-11 ENCOUNTER — Encounter: Payer: Self-pay | Admitting: *Deleted

## 2014-12-11 ENCOUNTER — Ambulatory Visit (HOSPITAL_BASED_OUTPATIENT_CLINIC_OR_DEPARTMENT_OTHER): Payer: Non-veteran care

## 2014-12-11 VITALS — BP 113/61 | HR 107 | Temp 97.5°F | Resp 18

## 2014-12-11 DIAGNOSIS — C01 Malignant neoplasm of base of tongue: Secondary | ICD-10-CM

## 2014-12-11 DIAGNOSIS — Z5112 Encounter for antineoplastic immunotherapy: Secondary | ICD-10-CM | POA: Diagnosis not present

## 2014-12-11 DIAGNOSIS — Z510 Encounter for antineoplastic radiation therapy: Secondary | ICD-10-CM | POA: Diagnosis not present

## 2014-12-11 MED ORDER — LORAZEPAM 2 MG/ML IJ SOLN
INTRAMUSCULAR | Status: AC
Start: 2014-12-11 — End: 2014-12-11
  Filled 2014-12-11: qty 1

## 2014-12-11 MED ORDER — SODIUM CHLORIDE 0.9 % IV SOLN
Freq: Once | INTRAVENOUS | Status: AC
  Administered 2014-12-11: 16:00:00 via INTRAVENOUS
  Filled 2014-12-11: qty 4

## 2014-12-11 MED ORDER — DIPHENHYDRAMINE HCL 50 MG/ML IJ SOLN
INTRAMUSCULAR | Status: AC
  Filled 2014-12-11: qty 1

## 2014-12-11 MED ORDER — METHYLPREDNISOLONE SODIUM SUCC 40 MG IJ SOLR
INTRAMUSCULAR | Status: AC
  Filled 2014-12-11: qty 1

## 2014-12-11 MED ORDER — HEPARIN SOD (PORK) LOCK FLUSH 100 UNIT/ML IV SOLN
500.0000 [IU] | Freq: Once | INTRAVENOUS | Status: AC | PRN
  Administered 2014-12-11: 500 [IU]
  Filled 2014-12-11: qty 5

## 2014-12-11 MED ORDER — DIPHENHYDRAMINE HCL 50 MG/ML IJ SOLN
50.0000 mg | Freq: Once | INTRAMUSCULAR | Status: AC
  Administered 2014-12-11: 50 mg via INTRAVENOUS

## 2014-12-11 MED ORDER — METHYLPREDNISOLONE SODIUM SUCC 40 MG IJ SOLR
40.0000 mg | Freq: Once | INTRAMUSCULAR | Status: AC
  Administered 2014-12-11: 40 mg via INTRAVENOUS

## 2014-12-11 MED ORDER — LORAZEPAM 2 MG/ML IJ SOLN
0.5000 mg | Freq: Once | INTRAMUSCULAR | Status: AC
  Administered 2014-12-11: 0.5 mg via INTRAVENOUS

## 2014-12-11 MED ORDER — SODIUM CHLORIDE 0.9 % IV SOLN
Freq: Once | INTRAVENOUS | Status: AC
  Administered 2014-12-11: 12:00:00 via INTRAVENOUS

## 2014-12-11 MED ORDER — CETUXIMAB CHEMO IV INJECTION 200 MG/100ML
250.0000 mg/m2 | Freq: Once | INTRAVENOUS | Status: AC
  Administered 2014-12-11: 500 mg via INTRAVENOUS
  Filled 2014-12-11: qty 250

## 2014-12-11 MED ORDER — SODIUM CHLORIDE 0.9 % IJ SOLN
10.0000 mL | INTRAMUSCULAR | Status: DC | PRN
  Administered 2014-12-11: 10 mL
  Filled 2014-12-11: qty 10

## 2014-12-11 NOTE — Progress Notes (Signed)
Okay to treat with K+ 5.6 per Cameo, RN.  Will administer drug as if first time infusion d/t previous reaction.    13:45- Pt complaining of increase in leg tingling; went from a level 4 to 7.  Selena Lesser, NP notified of change.  Verbal order received for pt to receive 0.66m Ativan to see if pain is d/t restless leg from Benadryl premed.  Pt given 0.5770mAtivan and drifted off to sleep with no further complaints.  Pt notified to let us[place]oreanow of any changes.  Continuing to closely monitor pt and will notify CySelena LesserNP of any further changes.    15:25- Pt has completed Erbitux infusion and has no complaints at this time.  Pt being [place] to [facility].  TaRosalio MacadamiaRN transporting with hypersensitivity kit as pt is now under 1 hour post observation period.    16:20- [place] [facility] [facility] [place].  CySelena LesserNP notified and verbal order given for 70m24mofran IVPB.  Will continue to monitor with administration of med.  16:50- Pt reports nausea has subsided and has no further complaints at this time.  Completed 1 hour post observation period.  Discharged via wheelchair accompanied by daughter.  Informed pt and daughter to call us [place] anything changes or develops.  Pt and family verbalized understanding and [facility] confirmed with daughter.

## 2014-12-11 NOTE — Telephone Encounter (Signed)
  Oncology Nurse Navigator Documentation   Navigator Encounter Type: Telephone (12/10/14 1528)         Interventions: Coordination of Care;Medication assitance (12/10/14 1528)     Spoke with nurse Seth Bake who works with patient's [place] PCP, Dr. [name]. She instructed to fax MD notes noting Rx prescriptions to her attention, Dr. [name] will in turn order thru [place]. Seth Bake provided her pager [phone], [phone], as supplement to [place] phone # [phone] ext [phone]. I provided my phone # for her future use.  She expressed appreciation for direct contact.  [name], RN, BSN, [place] at [place] [phone]           Time Spent with Patient: 15 (12/10/14 1528)

## 2014-12-11 NOTE — Patient Instructions (Signed)
Cetuximab injection What is this medicine? CETUXIMAB (se TUX i mab) is a monoclonal antibody. It is used to treat colorectal cancer and head and neck cancer. This medicine may be used for other purposes; ask your health care provider or pharmacist if you have questions. What should I tell my health care provider before I take this medicine? They need to know if you have any of these conditions: -heart disease -history of irregular heartbeat -history of low levels of calcium, magnesium, or potassium in the blood -lung or breathing disease, like asthma -an unusual or allergic reaction to cetuximab, other medicines, foods, dyes, or preservatives -pregnant or trying to get pregnant -breast-feeding How should I use this medicine? This drug is given as an infusion into a vein. It is administered in a hospital or clinic by a specially trained health care professional. Talk to your pediatrician regarding the use of this medicine in children. Special care may be needed. Overdosage: If you think you have taken too much of this medicine contact a poison control center or emergency room at once. NOTE: This medicine is only for you. Do not share this medicine with others. What if I miss a dose? It is important not to miss your dose. Call your doctor or health care professional if you are unable to keep an appointment. What may interact with this medicine? Interactions are not expected. This list may not describe all possible interactions. Give your health care provider a list of all the medicines, herbs, non-prescription drugs, or dietary supplements you use. Also tell them if you smoke, drink alcohol, or use illegal drugs. Some items may interact with your medicine. What should I watch for while using this medicine? Visit your doctor or health care professional for regular checks on your progress. This drug may make you feel generally unwell. This is not uncommon, as chemotherapy can affect healthy cells  as well as cancer cells. Report any side effects. Continue your course of treatment even though you feel ill unless your doctor tells you to stop. This medicine can make you more sensitive to the sun. Keep out of the sun while taking this medicine and for 2 months after the last dose. If you cannot avoid being in the sun, wear protective clothing and use sunscreen. Do not use sun lamps or tanning beds/booths. You may need blood work done while you are taking this medicine. In some cases, you may be given additional medicines to help with side effects. Follow all directions for their use. Call your doctor or health care professional for advice if you get a fever, chills or sore throat, or other symptoms of a cold or flu. Do not treat yourself. This drug decreases your body's ability to fight infections. Try to avoid being around people who are sick. Avoid taking products that contain aspirin, acetaminophen, ibuprofen, naproxen, or ketoprofen unless instructed by your doctor. These medicines may hide a fever. Do not become pregnant while taking this medicine. Women should inform their doctor if they wish to become pregnant or think they might be pregnant. There is a potential for serious side effects to an unborn child. Use adequate birth control methods. Avoid pregnancy for at least 6 months after your last dose. Talk to your health care professional or pharmacist for more information. Do not breast-feed an infant while taking this medicine or during the 2 months after your last dose. What side effects may I notice from receiving this medicine? Side effects that you should report to   your doctor or health care professional as soon as possible: -allergic reactions like skin rash, itching or hives, swelling of the face, lips, or tongue -breathing problems -changes in vision -fast, irregular heartbeat -feeling faint or lightheaded, falls -fever, chills -mouth sores -redness, blistering, peeling or  loosening of the skin, including inside the mouth -trouble passing urine or change in the amount of urine -unusually weak or tired Side effects that usually do not require medical attention (report to your doctor or health care professional if they continue or are bothersome): -changes in skin like acne, cracks, skin dryness -constipation -diarrhea -headache -nail changes -nausea, vomiting -stomach upset -weight loss This list may not describe all possible side effects. Call your doctor for medical advice about side effects. You may report side effects to FDA at 1-800-FDA-1088. Where should I keep my medicine? This drug is given in a hospital or clinic and will not be stored at home. NOTE: This sheet is a summary. It may not cover all possible information. If you have questions about this medicine, talk to your doctor, pharmacist, or health care provider.    2016, Elsevier/Gold Standard. (2014-04-24 35:00:93)  [place] Discharge Instructions for Patients Receiving Chemotherapy  Today you received the following chemotherapy agents Erbitux.  To help prevent nausea and vomiting after your treatment, we encourage you to take your nausea medication as directed.   If you develop nausea and vomiting that is not controlled by your nausea medication, call the clinic.   BELOW ARE SYMPTOMS THAT SHOULD BE REPORTED IMMEDIATELY:  *FEVER GREATER THAN 100.5 F  *CHILLS WITH OR WITHOUT FEVER  NAUSEA AND VOMITING THAT IS NOT CONTROLLED WITH YOUR NAUSEA MEDICATION  *UNUSUAL SHORTNESS OF BREATH  *UNUSUAL BRUISING OR BLEEDING  TENDERNESS IN MOUTH AND THROAT WITH OR WITHOUT PRESENCE OF ULCERS  *URINARY PROBLEMS  *BOWEL PROBLEMS  UNUSUAL RASH Items with * indicate a potential emergency and should be followed up as soon as possible.  Feel free to call the clinic you have any questions or concerns. The clinic phone number is [phone].  Please show the [place]  at check-in to the Emergency Department and triage nurse.

## 2014-12-12 ENCOUNTER — Ambulatory Visit
Admission: RE | Admit: 2014-12-12 | Discharge: 2014-12-12 | Disposition: A | Discharge status: Home / Self Care | Payer: Non-veteran care | Source: Ambulatory Visit | Attending: Radiation Oncology | Admitting: Radiation Oncology

## 2014-12-12 ENCOUNTER — Encounter: Payer: Self-pay | Admitting: *Deleted

## 2014-12-12 DIAGNOSIS — Z510 Encounter for antineoplastic radiation therapy: Secondary | ICD-10-CM | POA: Diagnosis not present

## 2014-12-12 NOTE — Progress Notes (Addendum)
  Oncology Nurse Navigator Documentation   Navigator Encounter Type: Other (12/11/14 1327)         Interventions: Coordination of Care;Medication assitance (12/11/14 1327)     Per guidance from [place] PCP Dr. Evette Georges RN Seth Bake, faxed Dr. Calton Dach 10/11 Progress Note and medication detail for 1) hydrocortisone 1% ointment, 2) clindamycin 1% gel, and 3) morphine 20 mg/mL liquid to her attention.  Per our previous conversation, she will forward to Dr. [name] for medication processing.  [name], RN, BSN, [place] at [place] [phone]           Time Spent with Patient: 30 (12/11/14 1327)

## 2014-12-12 NOTE — Progress Notes (Addendum)
  Oncology Nurse Navigator Documentation   Navigator Encounter Type: Other (12/12/14 1445)         Interventions: Coordination of Care;Medication assitance (12/12/14 1445)     Per guidance from [place] PCP Dr. Evette Georges RN Seth Bake, faxed: 1.   Dr. Pearlie Oyster 10/10 Progress Note and medication detail for 1) lidocaine 2% solution, and 2) sucralfate 1 g tab, and    2.   My 10/6 documentation request to Dr. Alvy Bimler and medication detail for 1) ondansetron 8 mg tablet, and 2) prochlorperazine 10 mg tablet to her attention. Per our previous conversation, she will forward to Dr. [name] for medication processing.  [name], RN, BSN, [place] at [place] [phone]          Time Spent with Patient: 15 (12/12/14 1445)

## 2014-12-13 ENCOUNTER — Ambulatory Visit
Admission: RE | Admit: 2014-12-13 | Discharge: 2014-12-13 | Disposition: A | Discharge status: Home / Self Care | Payer: Non-veteran care | Source: Ambulatory Visit | Attending: Radiation Oncology | Admitting: Radiation Oncology

## 2014-12-13 DIAGNOSIS — Z510 Encounter for antineoplastic radiation therapy: Secondary | ICD-10-CM | POA: Diagnosis not present

## 2014-12-16 ENCOUNTER — Ambulatory Visit
Admission: RE | Admit: 2014-12-16 | Discharge: 2014-12-16 | Disposition: A | Discharge status: Home / Self Care | Payer: Non-veteran care | Source: Ambulatory Visit | Attending: Radiation Oncology | Admitting: Radiation Oncology

## 2014-12-16 ENCOUNTER — Other Ambulatory Visit (HOSPITAL_BASED_OUTPATIENT_CLINIC_OR_DEPARTMENT_OTHER): Payer: Non-veteran care

## 2014-12-16 ENCOUNTER — Encounter: Payer: Self-pay | Admitting: Radiation Oncology

## 2014-12-16 ENCOUNTER — Encounter: Payer: Self-pay | Admitting: Adult Health

## 2014-12-16 VITALS — BP 101/66 | HR 98 | Temp 99.0°F | Ht 69.0 in | Wt 168.5 lb

## 2014-12-16 DIAGNOSIS — C01 Malignant neoplasm of base of tongue: Secondary | ICD-10-CM | POA: Diagnosis not present

## 2014-12-16 DIAGNOSIS — Z510 Encounter for antineoplastic radiation therapy: Secondary | ICD-10-CM | POA: Diagnosis not present

## 2014-12-16 LAB — CBC WITH DIFFERENTIAL/PLATELET
BASO%: 0.3 % (ref 0.0–2.0)
Basophils Absolute: 0 10*3/uL (ref 0.0–0.1)
EOS%: 1.3 % (ref 0.0–7.0)
Eosinophils Absolute: 0.1 10*3/uL (ref 0.0–0.5)
HEMATOCRIT: 37.8 % — AB (ref 38.4–49.9)
HEMOGLOBIN: 12.1 g/dL — AB (ref 13.0–17.1)
LYMPH#: 0.7 10*3/uL — AB (ref 0.9–3.3)
LYMPH%: 6.2 % — ABNORMAL LOW (ref 14.0–49.0)
MCH: 25.2 pg — ABNORMAL LOW (ref 27.2–33.4)
MCHC: 32 g/dL (ref 32.0–36.0)
MCV: 78.6 fL — ABNORMAL LOW (ref 79.3–98.0)
MONO#: 0.8 10*3/uL (ref 0.1–0.9)
MONO%: 7.5 % (ref 0.0–14.0)
NEUT%: 84.7 % — ABNORMAL HIGH (ref 39.0–75.0)
NEUTROS ABS: 9.5 10*3/uL — AB (ref 1.5–6.5)
Platelets: 358 10*3/uL (ref 140–400)
RBC: 4.81 10*6/uL (ref 4.20–5.82)
RDW: 15.2 % — AB (ref 11.0–14.6)
WBC: 11.3 10*3/uL — ABNORMAL HIGH (ref 4.0–10.3)

## 2014-12-16 LAB — COMPREHENSIVE METABOLIC PANEL (CC13)
ALBUMIN: 2.8 g/dL — AB (ref 3.5–5.0)
ALK PHOS: 58 U/L (ref 40–150)
ALT: <9 U/L (ref 0–55)
ANION GAP: 9 meq/L (ref 3–11)
AST: 11 U/L (ref 5–34)
BILIRUBIN TOTAL: 0.47 mg/dL (ref 0.20–1.20)
BUN: 26.5 mg/dL — ABNORMAL HIGH (ref 7.0–26.0)
CO2: 26 meq/L (ref 22–29)
Calcium: 9.3 mg/dL (ref 8.4–10.4)
Chloride: 101 mEq/L (ref 98–109)
Creatinine: 1.1 mg/dL (ref 0.7–1.3)
EGFR: 68 mL/min/{1.73_m2} — AB (ref >90)
Glucose: 111 mg/dl (ref 70–140)
Potassium: 4.7 mEq/L (ref 3.5–5.1)
Sodium: 136 mEq/L (ref 136–145)
TOTAL PROTEIN: 6.9 g/dL (ref 6.4–8.3)

## 2014-12-16 LAB — MAGNESIUM (CC13): Magnesium: 2 mg/dl (ref 1.5–2.5)

## 2014-12-16 NOTE — Progress Notes (Signed)
Ryden Wainer has completed 8 fractions to his tongue and neck.  He reports pain 10/10 in his mouth (inside and out) and his left ear.  He is using hycet once a day.  He reports he is eating a liquid diet.  He is drinking 3 ensures per day.  He is using his feeding tube and is putting 2 bottles of nutrime (?)  through his feeding tube.  His feeding tube is red around the insertion site.  His mouth is dry and he reports having thick saliva.  He reports feeling congested.  His skin is red and he is applying biafine.  He reports he is waiting for his pain medication (Morphine, Carafate) to arrive from the [place].  He has chemotherapy on Wednesday's.  BP 101/66 mmHg  Pulse 98  Temp(Src) 99 F (37.2 C) (Oral)  Ht 5\' 9"  (1.753 m)  Wt 168 lb 8 oz (76.431 kg)  BMI 24.87 kg/m2  SpO2 100%   Wt Readings from Last 3 Encounters:  12/16/14 168 lb 8 oz (76.431 kg)  12/10/14 166 lb 8 oz (75.524 kg)  12/09/14 168 lb 6.4 oz (76.386 kg)

## 2014-12-16 NOTE — Progress Notes (Signed)
Weekly Management Note:  Outpatient    ICD-9-CM ICD-10-CM   1. 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  CBCT/MVCT images/Port film x-rays were reviewed.  The chart was checked.   Kenneth Jordan has completed 8 fractions to his tongue and neck.  He reports pain 10/10 in his mouth (inside and out) and his left ear.  He is using hycet once a day.  He reports he is eating a liquid diet.  He is drinking 3 ensures per day.  He is using his feeding tube and is putting 2 bottles of nutrime (?)  through his feeding tube.  His feeding tube is red around the insertion site.  His mouth is dry and he reports having thick saliva.  He reports feeling congested.  His skin is red and he is applying biafine.  He reports he is waiting for his pain medication (Morphine, Carafate) to arrive from the [place].  He has chemotherapy on Wednesday's.  His daughter reports suboptimal compliance with tube feeds and PO intake     Physical Findings:  Wt Readings from Last 3 Encounters:  12/16/14 168 lb 8 oz (76.431 kg)  12/10/14 166 lb 8 oz (75.524 kg)  12/09/14 168 lb 6.4 oz (76.386 kg)    height is 5\' 9"  (1.753 m) and weight is 168 lb 8 oz (76.431 kg). His oral temperature is 99 F (37.2 C). His blood pressure is 101/66 and his pulse is 98. His oxygen saturation is 100%.  no mucositis in mouth,neck skin intact. erythematous rash over neck/face. PEG site unremarkable CBC    Component Value Date/Time   WBC 11.3* 12/16/2014 1433   WBC 8.4 11/18/2014 1100   RBC 4.81 12/16/2014 1433   RBC 4.57 11/18/2014 1100   HGB 12.1* 12/16/2014 1433   HGB 12.0* 11/18/2014 1100   HCT 37.8* 12/16/2014 1433   HCT 37.7* 11/18/2014 1100   PLT 358 12/16/2014 1433   PLT 352 11/18/2014 1100   MCV 78.6* 12/16/2014 1433   MCV 82.5 11/18/2014 1100   MCH 25.2* 12/16/2014 1433   MCH 26.3 11/18/2014 1100   MCHC 32.0  12/16/2014 1433   MCHC 31.8 11/18/2014 1100   RDW 15.2* 12/16/2014 1433   RDW 13.7 11/18/2014 1100   LYMPHSABS 0.7* 12/16/2014 1433   MONOABS 0.8 12/16/2014 1433   EOSABS 0.1 12/16/2014 1433   BASOSABS 0.0 12/16/2014 1433     CMP     Component Value Date/Time   NA 136 12/16/2014 1433   NA 134* 11/18/2014 1100   K 4.7 12/16/2014 1433   K 5.3* 11/18/2014 1100   CL 101 11/18/2014 1100   CO2 26 12/16/2014 1433   CO2 26 11/18/2014 1100   GLUCOSE 111 12/16/2014 1433   GLUCOSE 89 11/18/2014 1100   BUN 26.5* 12/16/2014 1433   BUN 26* 11/18/2014 1100   CREATININE 1.1 12/16/2014 1433   CREATININE 1.44* 11/18/2014 1100   CALCIUM 9.3 12/16/2014 1433   CALCIUM 9.2 11/18/2014 1100   PROT 6.9 12/16/2014 1433   ALBUMIN 2.8* 12/16/2014 1433   AST 11 12/16/2014 1433   ALT <9 12/16/2014 1433   ALKPHOS 58 12/16/2014 1433   BILITOT 0.47 12/16/2014 1433   GFRNONAA 45* 11/18/2014 1100   GFRAA 53* 11/18/2014 1100     Impression:  The patient is tolerating radiotherapy.   Plan:  Continue radiotherapy as planned. Discussed methods to increase tube feedings and PO intake.  Introduced patient to Benson Setting, NP of survivorship -----------------------------------  Kenneth Gibson, MD

## 2014-12-16 NOTE — Progress Notes (Signed)
I briefly met with Kenneth Jordan and his daughter during their Under Treat visit with Dr. Isidore Moos.  I gave them my business card and encouraged them to call me with any questions or concerns.  I look forward to participating in his care.   Mike Craze, NP [place] [phone]

## 2014-12-17 ENCOUNTER — Ambulatory Visit
Admission: RE | Admit: 2014-12-17 | Discharge: 2014-12-17 | Disposition: A | Discharge status: Home / Self Care | Payer: Non-veteran care | Source: Ambulatory Visit | Attending: Radiation Oncology | Admitting: Radiation Oncology

## 2014-12-17 ENCOUNTER — Telehealth: Payer: Self-pay | Admitting: Hematology and Oncology

## 2014-12-17 ENCOUNTER — Encounter: Payer: Self-pay | Admitting: Hematology and Oncology

## 2014-12-17 ENCOUNTER — Ambulatory Visit (HOSPITAL_BASED_OUTPATIENT_CLINIC_OR_DEPARTMENT_OTHER): Payer: Non-veteran care | Admitting: Hematology and Oncology

## 2014-12-17 VITALS — BP 132/62 | HR 94 | Temp 98.9°F | Resp 18 | Ht 69.0 in | Wt 168.4 lb

## 2014-12-17 DIAGNOSIS — K1230 Oral mucositis (ulcerative), unspecified: Secondary | ICD-10-CM | POA: Diagnosis not present

## 2014-12-17 DIAGNOSIS — C01 Malignant neoplasm of base of tongue: Secondary | ICD-10-CM | POA: Diagnosis not present

## 2014-12-17 DIAGNOSIS — K9281 Gastrointestinal mucositis (ulcerative): Secondary | ICD-10-CM

## 2014-12-17 DIAGNOSIS — K9429 Other complications of gastrostomy: Secondary | ICD-10-CM

## 2014-12-17 DIAGNOSIS — Z510 Encounter for antineoplastic radiation therapy: Secondary | ICD-10-CM | POA: Diagnosis not present

## 2014-12-17 DIAGNOSIS — R21 Rash and other nonspecific skin eruption: Secondary | ICD-10-CM

## 2014-12-17 DIAGNOSIS — Z09 Encounter for follow-up examination after completed treatment for conditions other than malignant neoplasm: Secondary | ICD-10-CM

## 2014-12-17 MED ORDER — CLINDAMYCIN PHOSPHATE 1 % EX GEL: Freq: Two times a day (BID) | CUTANEOUS | Status: DC

## 2014-12-17 MED ORDER — MORPHINE SULFATE (CONCENTRATE) 20 MG/ML PO SOLN: 10.0000 mg | ORAL | Status: DC | PRN

## 2014-12-17 NOTE — Progress Notes (Signed)
Kenneth Jordan OFFICE PROGRESS NOTE  Patient Care Team: Pcp Not In System as PCP - General Leota Sauers, RN as Oncology Nurse Navigator Heath Lark, MD as Consulting Physician (Hematology and Oncology) Eppie Gibson, MD as Attending Physician (Radiation Oncology) Karie Mainland, RD as Dietitian (Nutrition)  SUMMARY OF ONCOLOGIC HISTORY: Oncology History   Cancer of base of tongue   Staging form: Lip and Oral Cavity, AJCC 7th Edition     Clinical stage from 11/08/2014: Stage IVA (T4a, N2c, M0) - Signed by Heath Lark, MD on 11/08/2014       Cancer of base of tongue ([place])   07/08/2014 Miscellaneous  the patient noted tongue swelling / mass   10/04/2014 Imaging  CT scan done at the [facility] show large tongue mass with bilateral neck lymphadenopathy   10/07/2014 Miscellaneous  he was seen at the [facility] for evaluation was noted to have tongue cancer   10/10/2014 Imaging  PET CT scan from the [facility] showed extensive regional lymphadenopathy on the left side including supraclavicular lymph node involvement and possibly right jugulodigastric lymph node involvement.   10/24/2014 Procedure  he underwent laryngoscopy and biopsy. there is a large ulcerative tongue a mass on the left involving the tonsil, soft palate, epiglottis.   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 The dose of Erbitux is reduced by 50%    INTERVAL HISTORY: Please see below for problem oriented charting. He returns prior to treatment  today. Since the last time I saw him, he did not fill his prescriptions, awaiting for [place] authorization. He complained of worsening mucositis. He rated his pain as 6 out of 10. He has developed worsening skin rashes. Denies recent diarrhea. No oral bleeding. He is able to tolerate 45 cans of nutritional supplement with ample fluid in between.   REVIEW OF SYSTEMS:   Constitutional: Denies fevers, chills or abnormal weight loss Eyes: Denies blurriness of vision Respiratory: Denies cough, dyspnea or wheezes Cardiovascular: Denies palpitation, chest discomfort or lower extremity swelling Gastrointestinal:  Denies nausea, heartburn or change in bowel habits Lymphatics: Denies new lymphadenopathy or easy bruising Neurological:Denies numbness, tingling or new weaknesses Behavioral/Psych: Mood is stable, no new changes  All other systems were reviewed with the patient and are negative.  I have reviewed the past medical history, past surgical history, social history and family history with the patient and they are unchanged from previous note.  ALLERGIES:  is allergic to other.  MEDICATIONS:  Current Outpatient Prescriptions  Medication Sig Dispense Refill  . fluticasone (FLONASE) 50 MCG/ACT nasal spray Place 1 spray into the nose every morning.     Marland Kitchen HYDROcodone-acetaminophen (HYCET) 7.5-325 mg/15 ml solution Take 15 mLs by mouth every 6 (six) hours as needed for moderate pain. 180 mL 0  . lidocaine-prilocaine (EMLA) cream Apply to affected area once 30 g 3  . loratadine (CLARITIN) 10 MG tablet Take 10 mg by mouth daily as needed for allergies.    . Menthol, Topical Analgesic, 16 %  LIQD Apply 1 application topically daily. Shoulder pain.    . sodium fluoride (FLUORISHIELD) 1.1 % GEL dental gel Instill one drop of gel per tooth space of fluoride tray. Place over teeth for 5 minutes. Remove. Spit out excess. Repeat nightly 120 mL prn  . zolpidem (AMBIEN) 10 MG tablet Take 5 mg by mouth at  bedtime.     . clindamycin (CLINDAGEL) 1 % gel Apply topically 2 (two) times daily. 60 g 2  . hydrocortisone 1 % ointment Apply 1 application topically 2 (two) times daily. 1 large tube (Patient not taking: Reported on 12/16/2014) 56 g 0  . lidocaine (XYLOCAINE) 2 % solution Mix 1 part 2%viscous lidocaine,1part H2O.Swish and/or swallow 29mL of this mixture,12min before meals and at bedtime, up to QID (Patient not taking: Reported on 12/17/2014) 100 mL 5  . morphine (ROXANOL) 20 MG/ML concentrated solution Place 0.5 mLs (10 mg total) into feeding tube every 2 (two) hours as needed for severe pain. 240 mL 0  . omeprazole (PRILOSEC) 20 MG capsule Take 20 mg by mouth at bedtime.     . ondansetron (ZOFRAN) 8 MG tablet Take 1 tablet (8 mg total) by mouth every 8 (eight) hours as needed for nausea. (Patient not taking: Reported on 12/16/2014) 30 tablet 3  . prochlorperazine (COMPAZINE) 10 MG tablet Take 1 tablet (10 mg total) by mouth every 6 (six) hours as needed. (Patient not taking: Reported on 12/16/2014) 30 tablet 3  . sucralfate (CARAFATE) 1 G tablet Dissolve 1 tablet in 49mL H2O and swallow up to QID,PRN sore throat. (Patient not taking: Reported on 12/16/2014) 60 tablet 5   No current facility-administered medications for this visit.    PHYSICAL EXAMINATION: ECOG PERFORMANCE STATUS: 2 - Symptomatic, <50% confined to bed  Filed Vitals:   12/17/14 1436  BP: 132/62  Pulse: 94  Temp: 98.9 F (37.2 C)  Resp: 18   Filed Weights   12/17/14 1436  Weight: 168 lb 6.4 oz (76.386 kg)    GENERAL:alert, no distress and comfortable SKIN: Noted worsening skin rash without ulceration  EYES: normal, Conjunctiva are pink and non-injected, sclera clear OROPHARYNX: Noted worsening mucositis without thrush  NECK: supple, thyroid normal size, non-tender, without nodularity LYMPH:  Previously palpable lymph node in the neck is getting smaller in size  LUNGS: clear to auscultation and percussion with  normal breathing effort HEART: regular rate & rhythm and no murmurs and no lower extremity edema ABDOMEN:abdomen soft, non-tender and normal bowel sounds. Feeding tube site looks okay Musculoskeletal:no cyanosis of digits and no clubbing  NEURO: alert & oriented x 3 with fluent speech, no focal motor/sensory deficits  LABORATORY DATA:  I have reviewed the data as listed    Component Value Date/Time   NA 136 12/16/2014 1433   NA 134* 11/18/2014 1100   K 4.7 12/16/2014 1433   K 5.3* 11/18/2014 1100   CL 101 11/18/2014 1100   CO2 26 12/16/2014 1433   CO2 26 11/18/2014 1100   GLUCOSE 111 12/16/2014 1433   GLUCOSE 89 11/18/2014 1100   BUN 26.5* 12/16/2014 1433   BUN 26* 11/18/2014 1100   CREATININE 1.1 12/16/2014 1433   CREATININE 1.44* 11/18/2014 1100   CALCIUM 9.3 12/16/2014 1433   CALCIUM 9.2 11/18/2014 1100   PROT 6.9 12/16/2014 1433   ALBUMIN 2.8* 12/16/2014 1433   AST 11 12/16/2014 1433   ALT <9 12/16/2014 1433   ALKPHOS 58 12/16/2014 1433   BILITOT 0.47 12/16/2014 1433   GFRNONAA 45*  11/18/2014 1100   GFRAA 53* 11/18/2014 1100    No results found for: SPEP, UPEP  Lab Results  Component Value Date   WBC 11.3* 12/16/2014   NEUTROABS 9.5* 12/16/2014   HGB 12.1* 12/16/2014   HCT 37.8* 12/16/2014   MCV 78.6* 12/16/2014   PLT 358 12/16/2014      Chemistry      Component Value Date/Time   NA 136 12/16/2014 1433   NA 134* 11/18/2014 1100   K 4.7 12/16/2014 1433   K 5.3* 11/18/2014 1100   CL 101 11/18/2014 1100   CO2 26 12/16/2014 1433   CO2 26 11/18/2014 1100   BUN 26.5* 12/16/2014 1433   BUN 26* 11/18/2014 1100   CREATININE 1.1 12/16/2014 1433   CREATININE 1.44* 11/18/2014 1100      Component Value Date/Time   CALCIUM 9.3 12/16/2014 1433   CALCIUM 9.2 11/18/2014 1100   ALKPHOS 58 12/16/2014 1433   AST 11 12/16/2014 1433   ALT <9 12/16/2014 1433   BILITOT 0.47 12/16/2014 1433     ASSESSMENT & PLAN:  Cancer of base of tongue ([place]) He is experiencing  more side effects from treatment. I will reduce the dose of cetuximab by 50%. His blood counts are stable and I do not think it is necessary to hold treatment this week. Hopefully, with reduced dose of treatment, he would be able to tolerate that better.  Rash, skin He has worsening skin rash since last week. He has not filled the prescription for clindamycin cream. Apparently, he was waiting for [facility] authorization. I gave him a prescription today and asked him to inquire the cost at a local pharmacy. I will reduce the dose of cetuximab as above.  Stomal mucositis (HCC) He has worsening mucositis on his tongue, likely due to treatment. I will reduce the dose of cetuximab as above. The patient did not fill the prescription of Roxanol; again awaiting [place]. I gave him a prescription today and asked him to inquire local pharmacy for the cost  S/P gastrostomy tube (G tube) placement, follow-up exam The feeding tube site looks okay without signs of infection. I encouraged the patient to use his feeding tube frequently for nutrition with additional water flushes.      No orders of the defined types were placed in this encounter.   All questions were answered. The patient knows to call the clinic with any problems, questions or concerns. No barriers to learning was detected. I spent 30 minutes counseling the patient face to face. The total time spent in the appointment was 40 minutes and more than 50% was on counseling and review of test results     [facility], [place], MD 12/17/2014 4:52 PM

## 2014-12-17 NOTE — Assessment & Plan Note (Signed)
The feeding tube site looks okay without signs of infection. I encouraged the patient to use his feeding tube frequently for nutrition with additional water flushes.

## 2014-12-17 NOTE — Assessment & Plan Note (Signed)
He is experiencing more side effects from treatment. I will reduce the dose of cetuximab by 50%. His blood counts are stable and I do not think it is necessary to hold treatment this week. Hopefully, with reduced dose of treatment, he would be able to tolerate that better.

## 2014-12-17 NOTE — Telephone Encounter (Signed)
per pof to sch pt appt-gave pt copy of avs °

## 2014-12-17 NOTE — Assessment & Plan Note (Signed)
He has worsening mucositis on his tongue, likely due to treatment. I will reduce the dose of cetuximab as above. The patient did not fill the prescription of Roxanol; again [place]. I gave him a prescription today and asked him to inquire local pharmacy for the cost

## 2014-12-17 NOTE — Assessment & Plan Note (Signed)
He has worsening skin rash since last week. He has not filled the prescription for clindamycin cream. Apparently, he was waiting for [facility] authorization. I gave him a prescription today and asked him to inquire the cost at a local pharmacy. I will reduce the dose of cetuximab as above.

## 2014-12-18 ENCOUNTER — Ambulatory Visit (HOSPITAL_BASED_OUTPATIENT_CLINIC_OR_DEPARTMENT_OTHER): Payer: Non-veteran care

## 2014-12-18 ENCOUNTER — Ambulatory Visit: Payer: Non-veteran care | Admitting: Nutrition

## 2014-12-18 ENCOUNTER — Telehealth: Payer: Self-pay | Admitting: *Deleted

## 2014-12-18 ENCOUNTER — Ambulatory Visit
Admission: RE | Admit: 2014-12-18 | Discharge: 2014-12-18 | Disposition: A | Discharge status: Home / Self Care | Payer: Non-veteran care | Source: Ambulatory Visit | Attending: Radiation Oncology | Admitting: Radiation Oncology

## 2014-12-18 DIAGNOSIS — C01 Malignant neoplasm of base of tongue: Secondary | ICD-10-CM

## 2014-12-18 DIAGNOSIS — Z5112 Encounter for antineoplastic immunotherapy: Secondary | ICD-10-CM | POA: Diagnosis not present

## 2014-12-18 DIAGNOSIS — Z510 Encounter for antineoplastic radiation therapy: Secondary | ICD-10-CM | POA: Diagnosis not present

## 2014-12-18 MED ORDER — SODIUM CHLORIDE 0.9 % IJ SOLN
10.0000 mL | INTRAMUSCULAR | Status: DC | PRN
  Administered 2014-12-18: 10 mL
  Filled 2014-12-18: qty 10

## 2014-12-18 MED ORDER — ACETAMINOPHEN 325 MG PO TABS
ORAL_TABLET | ORAL | Status: AC
  Filled 2014-12-18: qty 2

## 2014-12-18 MED ORDER — HEPARIN SOD (PORK) LOCK FLUSH 100 UNIT/ML IV SOLN
500.0000 [IU] | Freq: Once | INTRAVENOUS | Status: AC | PRN
  Administered 2014-12-18: 500 [IU]
  Filled 2014-12-18: qty 5

## 2014-12-18 MED ORDER — ACETAMINOPHEN 325 MG PO TABS
650.0000 mg | ORAL_TABLET | Freq: Once | ORAL | Status: AC
  Administered 2014-12-18: 650 mg via ORAL

## 2014-12-18 MED ORDER — DIPHENHYDRAMINE HCL 50 MG/ML IJ SOLN
12.5000 mg | Freq: Once | INTRAMUSCULAR | Status: AC
  Administered 2014-12-18: 12.5 mg via INTRAVENOUS

## 2014-12-18 MED ORDER — CETUXIMAB CHEMO IV INJECTION 200 MG/100ML
125.0000 mg/m2 | Freq: Once | INTRAVENOUS | Status: AC
  Administered 2014-12-18: 200 mg via INTRAVENOUS
  Filled 2014-12-18: qty 100

## 2014-12-18 MED ORDER — METHYLPREDNISOLONE SODIUM SUCC 125 MG IJ SOLR
INTRAMUSCULAR | Status: AC
  Filled 2014-12-18: qty 2

## 2014-12-18 MED ORDER — DIPHENHYDRAMINE HCL 50 MG/ML IJ SOLN
INTRAMUSCULAR | Status: AC
  Filled 2014-12-18: qty 1

## 2014-12-18 MED ORDER — SODIUM CHLORIDE 0.9 % IV SOLN
Freq: Once | INTRAVENOUS | Status: AC
  Administered 2014-12-18: 11:00:00 via INTRAVENOUS

## 2014-12-18 MED ORDER — METHYLPREDNISOLONE SODIUM SUCC 125 MG IJ SOLR
40.0000 mg | Freq: Once | INTRAMUSCULAR | Status: AC
  Administered 2014-12-18: 40 mg via INTRAVENOUS

## 2014-12-18 MED ORDER — METOCLOPRAMIDE HCL 10 MG PO TABS: 10.0000 mg | ORAL_TABLET | Freq: Three times a day (TID) | ORAL | Status: DC

## 2014-12-18 NOTE — Telephone Encounter (Signed)
  Oncology Nurse Navigator Documentation   Navigator Encounter Type: Other (12/18/14 1410)         Interventions: Coordination of Care (12/18/14 1410)     [place]ula to speak with RN Seth Bake re medication requests sent last week Wed and Thurs.  Spoke with Angie who indicated she was with a patient, she wd call me back.  I indicated nature of my call is urgent.  [name], RN, BSN, [place] at [place] [phone]             Time Spent with Patient: 15 (12/18/14 1410)

## 2014-12-18 NOTE — Progress Notes (Signed)
Assessment done at 1045

## 2014-12-18 NOTE — Patient Instructions (Signed)
[facility] Discharge Instructions for Patients Receiving Chemotherapy  Today you received the following chemotherapy agents ERBITUX  To help prevent nausea and vomiting after your treatment, we encourage you to take your nausea medication ONDANSETRON OR COMPAZINE AS NEEDED    If you develop nausea and vomiting that is not controlled by your nausea medication, call the clinic.   BELOW ARE SYMPTOMS THAT SHOULD BE REPORTED IMMEDIATELY:  *FEVER GREATER THAN 100.5 F  *CHILLS WITH OR WITHOUT FEVER  NAUSEA AND VOMITING THAT IS NOT CONTROLLED WITH YOUR NAUSEA MEDICATION  *UNUSUAL SHORTNESS OF BREATH  *UNUSUAL BRUISING OR BLEEDING  TENDERNESS IN MOUTH AND THROAT WITH OR WITHOUT PRESENCE OF ULCERS  *URINARY PROBLEMS  *BOWEL PROBLEMS  UNUSUAL RASH Items with * indicate a potential emergency and should be followed up as soon as possible.  Feel free to call the clinic you have any questions or concerns. The clinic phone number is [phone].  Please show the [place] at check-in to the Emergency Department and triage nurse.

## 2014-12-18 NOTE — Progress Notes (Signed)
15 minutes after erbitux completed pt c/o "burning across his shoulders and upper back" and being generally hot. VSS. Temp 99.2 from 89.9. Back is sweaty. Rash to upper back is unchanged from prior to start of treatment per daughter. Pt ambulated to bathroom and back. Back is dry and he does not feel as hot. Discussed with daughter monitoring for temp >100.5 or chills, worsening rash,  discussed wallet card for chemo patients. Pt dc'd to XRT ambulatory with daughter.

## 2014-12-18 NOTE — Progress Notes (Signed)
Nutrition follow-up completed with patient and daughter, during infusion for tongue cancer. Weight increased slightly and documented as 168.4 pounds. Albumin noted to be 2.8. Patient reports a bowel movement every other day. Reports it is getting more difficult to swallow liquids by mouth. Daughter states she provides 4 bottles Nutren 1.5 via feeding tube daily and patient has been consuming two Ensure Plus by mouth.  She is also trying to flush feeding tube with additional water.  Estimated nutrition needs: 2200-2400 calories, 110-125 grams protein, 2.4 L fluid.  Nutrition diagnosis: Inadequate oral intake continues.  Intervention:  Educated patient to continue to drink fluids as tolerated. Provided a sample of resource breeze for patient to try. Enforced importance of patient tolerating a minimum of 4 Nutren 1.5 via feeding tube and 2 Ensure Plus by mouth daily to meet minimum calorie needs. Patient educated that when he is unable to drink Ensure Plus by mouth he should use feeding tube more often. Teach back method was used.  Monitoring, evaluation, goals: Patient will work to continue to tolerate adequate calories and protein by mouth and through feeding tube to minimize weight loss.  Next visit: Wednesday, October 26 during infusion.  **Disclaimer: This note was dictated with voice recognition software. Similar sounding words can inadvertently be transcribed and this note may contain transcription errors which may not have been corrected upon publication of note.**

## 2014-12-18 NOTE — Telephone Encounter (Signed)
  Oncology Nurse Navigator Documentation   Navigator Encounter Type: Other (12/18/14 1550)         Interventions: Medication assitance (12/18/14 1550)       Received return call from patient's [place] PCP RN, Seth Bake.  She indicated Rx requests for hydrocortisone ointment, clindamycin, morphine, lidocaine, sucralfate were received last week but had not been processed.  I expressed urgency for fulfillment and receipt by pt ASAP.  She indicated she wd process and contact me tomorrow re status.  [name], RN, BSN, [place] at [place] [phone]         Time Spent with Patient: 15 (12/18/14 1550)

## 2014-12-19 ENCOUNTER — Encounter: Payer: Self-pay | Admitting: *Deleted

## 2014-12-19 ENCOUNTER — Ambulatory Visit
Admission: RE | Admit: 2014-12-19 | Discharge: 2014-12-19 | Disposition: A | Discharge status: Home / Self Care | Payer: Non-veteran care | Source: Ambulatory Visit | Attending: Radiation Oncology | Admitting: Radiation Oncology

## 2014-12-19 ENCOUNTER — Ambulatory Visit (HOSPITAL_COMMUNITY): Payer: Self-pay | Admitting: Dentistry

## 2014-12-19 DIAGNOSIS — Z510 Encounter for antineoplastic radiation therapy: Secondary | ICD-10-CM | POA: Diagnosis not present

## 2014-12-19 NOTE — Progress Notes (Signed)
  Oncology Nurse Navigator Documentation   Navigator Encounter Type: Treatment (12/19/14 1540) Patient Visit Type: Radonc (12/19/14 1540)     Met with patient after [place] tmt to check on well being.  His daughter Mickel Baas accompanied him. She reported:  He is using PEG for nutritional supplement and hydration.  Sister [name] filled morphine and clindamycin Rx since not received from [place] yet.  I explained I contacted PCP's RN yesterday re scripts, waiting for follow-up. They understand I can be contacted.  [name], RN, BSN, [place] at [place] [phone]                     Time Spent with Patient: 30 (12/19/14 1540)

## 2014-12-19 NOTE — Progress Notes (Signed)
  Oncology Nurse Navigator Documentation   Navigator Encounter Type: Other (12/19/14 1595)         Interventions: Medication assitance (12/19/14 0810)     Faxed 2nd time to Dr. Evette Georges RN Seth Bake documentation for Zofran and Compazine Rx issued by Dr. Alvy Bimler on 12/05/14 (initially sent 12/12/14). Fax confirmation received.    [name], RN, BSN, [place] at [place] [phone]          Time Spent with Patient: 15 (12/19/14 0810)

## 2014-12-20 ENCOUNTER — Ambulatory Visit
Admission: RE | Admit: 2014-12-20 | Discharge: 2014-12-20 | Disposition: A | Discharge status: Home / Self Care | Payer: Non-veteran care | Source: Ambulatory Visit | Attending: Radiation Oncology | Admitting: Radiation Oncology

## 2014-12-20 DIAGNOSIS — Z510 Encounter for antineoplastic radiation therapy: Secondary | ICD-10-CM | POA: Diagnosis not present

## 2014-12-23 ENCOUNTER — Encounter: Payer: Self-pay | Admitting: *Deleted

## 2014-12-23 ENCOUNTER — Other Ambulatory Visit (HOSPITAL_BASED_OUTPATIENT_CLINIC_OR_DEPARTMENT_OTHER): Payer: Medicare PPO

## 2014-12-23 ENCOUNTER — Telehealth: Payer: Self-pay | Admitting: *Deleted

## 2014-12-23 ENCOUNTER — Other Ambulatory Visit: Payer: Self-pay

## 2014-12-23 ENCOUNTER — Encounter: Payer: Self-pay | Admitting: Radiation Oncology

## 2014-12-23 ENCOUNTER — Ambulatory Visit
Admission: RE | Admit: 2014-12-23 | Discharge: 2014-12-23 | Disposition: A | Discharge status: Home / Self Care | Payer: Non-veteran care | Source: Ambulatory Visit | Attending: Radiation Oncology | Admitting: Radiation Oncology

## 2014-12-23 ENCOUNTER — Ambulatory Visit
Admission: RE | Admit: 2014-12-23 | Discharge: 2014-12-23 | Disposition: A | Discharge status: Home / Self Care | Payer: Medicare PPO | Source: Ambulatory Visit | Attending: Radiation Oncology | Admitting: Radiation Oncology

## 2014-12-23 VITALS — BP 117/62 | HR 105 | Temp 98.7°F | Ht 69.0 in | Wt 167.9 lb

## 2014-12-23 DIAGNOSIS — Z510 Encounter for antineoplastic radiation therapy: Secondary | ICD-10-CM | POA: Diagnosis not present

## 2014-12-23 DIAGNOSIS — C01 Malignant neoplasm of base of tongue: Secondary | ICD-10-CM

## 2014-12-23 LAB — CBC WITH DIFFERENTIAL/PLATELET
BASO%: 0.7 % (ref 0.0–2.0)
Basophils Absolute: 0.1 10*3/uL (ref 0.0–0.1)
EOS%: 3.9 % (ref 0.0–7.0)
Eosinophils Absolute: 0.3 10*3/uL (ref 0.0–0.5)
HCT: 37 % — ABNORMAL LOW (ref 38.4–49.9)
HGB: 11.9 g/dL — ABNORMAL LOW (ref 13.0–17.1)
LYMPH%: 12.4 % — AB (ref 14.0–49.0)
MCH: 25.2 pg — ABNORMAL LOW (ref 27.2–33.4)
MCHC: 32.3 g/dL (ref 32.0–36.0)
MCV: 78.1 fL — ABNORMAL LOW (ref 79.3–98.0)
MONO#: 0.8 10*3/uL (ref 0.1–0.9)
MONO%: 9.5 % (ref 0.0–14.0)
NEUT%: 73.5 % (ref 39.0–75.0)
NEUTROS ABS: 6.2 10*3/uL (ref 1.5–6.5)
PLATELETS: 451 10*3/uL — AB (ref 140–400)
RBC: 4.73 10*6/uL (ref 4.20–5.82)
RDW: 15.1 % — ABNORMAL HIGH (ref 11.0–14.6)
WBC: 8.4 10*3/uL (ref 4.0–10.3)
lymph#: 1 10*3/uL (ref 0.9–3.3)

## 2014-12-23 LAB — COMPREHENSIVE METABOLIC PANEL (CC13)
ALBUMIN: 2.6 g/dL — AB (ref 3.5–5.0)
ALK PHOS: 63 U/L (ref 40–150)
ALT: 11 U/L (ref 0–55)
AST: 14 U/L (ref 5–34)
Anion Gap: 9 mEq/L (ref 3–11)
BUN: 25 mg/dL (ref 7.0–26.0)
CALCIUM: 9.3 mg/dL (ref 8.4–10.4)
CO2: 27 mEq/L (ref 22–29)
CREATININE: 0.9 mg/dL (ref 0.7–1.3)
Chloride: 101 mEq/L (ref 98–109)
EGFR: 78 mL/min/{1.73_m2} — ABNORMAL LOW (ref >90)
GLUCOSE: 84 mg/dL (ref 70–140)
POTASSIUM: 4.8 meq/L (ref 3.5–5.1)
Sodium: 137 mEq/L (ref 136–145)
TOTAL PROTEIN: 6.6 g/dL (ref 6.4–8.3)

## 2014-12-23 LAB — MAGNESIUM (CC13): MAGNESIUM: 2 mg/dL (ref 1.5–2.5)

## 2014-12-23 NOTE — Progress Notes (Signed)
  Oncology Nurse Navigator Documentation   Navigator Encounter Type: Clinic/MDC (12/23/14 1545) Patient Visit Type: Radonc (12/23/14 1545)     To provide support and encouragement, care continuity and to assess for needs, met with patient during [place] with Dr. Isidore Moos.  His dtr [name] accompanied him. I informed them of my conversation with Angie at [facility], her indication medications were sent last week Thursday.  [name] indicated morphine was received already. He reported: 1. Minimally eating, soft food; drinking 1-2 Ensure daily, V8 fruit drinks. 2. Instilling ca 4 cans supplement "Nutrene" daily through PEG. 3. Second episode of dizziness over weekend after morning shower.  Dtr noted showers were before having any nutrition/hydration.    I suggested eating before showering in AM or start taking showers before bed.  Dr. Isidore Moos encouraged avoidance of hot showers.  He verbalized understanding of suggestions. They understand I can be contacted with needs/concerns.                    Time Spent with Patient: 45 (12/23/14 1545)

## 2014-12-23 NOTE — Progress Notes (Signed)
   Weekly Management Note:  Outpatient    ICD-9-CM ICD-10-CM   1. Cancer of base of tongue (HCC) 141.0 C01     Current Dose:  26 Gy  Projected Dose: 70 Gy   Narrative:  The patient presents for routine under treatment assessment.  CBCT/MVCT images/Port film x-rays were reviewed.  The chart was checked.  He reports that after showering on Sunday he became very lightheaded with weakness and almost fell.  Note slow gait today and some unsteadiness when standing during check for orthostatic pressures.  Note erythema of neck and face with increased dryness.  Change is speech with thickened tongue with noted redness.  Orthostatic vitals obtained - negative.  He is drinking at least 1-2 cans of ensure and Nutren at least 3 cans per day.    124/58, pulse 89, temp 98.7 - Sitting BP 117/62 mmHg  Pulse 105  Temp(Src) 98.7 F (37.1 C) - Standing    Physical Findings:  Wt Readings from Last 3 Encounters:  12/23/14 167 lb 14.4 oz (76.159 kg)  12/17/14 168 lb 6.4 oz (76.386 kg)  12/16/14 168 lb 8 oz (76.431 kg)    height is 5\' 9"  (1.753 m) and weight is 167 lb 14.4 oz (76.159 kg). His temperature is 98.7 F (37.1 C). His blood pressure is 117/62 and his pulse is 105.  no mucositis in mouth, mouth moist, no thrush, neck skin intact. erythematous rash over neck/face.    CBC    Component Value Date/Time   WBC 8.4 12/23/2014 1502   WBC 8.4 11/18/2014 1100   RBC 4.73 12/23/2014 1502   RBC 4.57 11/18/2014 1100   HGB 11.9* 12/23/2014 1502   HGB 12.0* 11/18/2014 1100   HCT 37.0* 12/23/2014 1502   HCT 37.7* 11/18/2014 1100   PLT 451* 12/23/2014 1502   PLT 352 11/18/2014 1100   MCV 78.1* 12/23/2014 1502   MCV 82.5 11/18/2014 1100   MCH 25.2* 12/23/2014 1502   MCH 26.3 11/18/2014 1100   MCHC 32.3 12/23/2014 1502   MCHC 31.8 11/18/2014 1100   RDW 15.1* 12/23/2014 1502   RDW 13.7 11/18/2014 1100   LYMPHSABS 1.0 12/23/2014 1502   MONOABS 0.8 12/23/2014 1502   EOSABS 0.3 12/23/2014 1502   BASOSABS 0.1 12/23/2014 1502     CMP     Component Value Date/Time   NA 137 12/23/2014 1502   NA 134* 11/18/2014 1100   K 4.8 12/23/2014 1502   K 5.3* 11/18/2014 1100   CL 101 11/18/2014 1100   CO2 27 12/23/2014 1502   CO2 26 11/18/2014 1100   GLUCOSE 84 12/23/2014 1502   GLUCOSE 89 11/18/2014 1100   BUN 25.0 12/23/2014 1502   BUN 26* 11/18/2014 1100   CREATININE 0.9 12/23/2014 1502   CREATININE 1.44* 11/18/2014 1100   CALCIUM 9.3 12/23/2014 1502   CALCIUM 9.2 11/18/2014 1100   PROT 6.6 12/23/2014 1502   ALBUMIN 2.6* 12/23/2014 1502   AST 14 12/23/2014 1502   ALT 11 12/23/2014 1502   ALKPHOS 63 12/23/2014 1502   BILITOT <0.30 12/23/2014 1502   GFRNONAA 45* 11/18/2014 1100   GFRAA 53* 11/18/2014 1100     Impression:  The patient is tolerating radiotherapy.   Plan:  Continue radiotherapy as planned Continue pushing PO intake Advised not to take hot showers, he has a bar in shower. He is holding Metoprolol. For skin irritation - try soaked black tea bags in addition to topical ointments -----------------------------------  Eppie Gibson, MD

## 2014-12-23 NOTE — Progress Notes (Addendum)
Kenneth Jordan reports that after showering on Sunday he became very lightheaded with weakness and almost fell.  Note slow gait today and some unsteadiness when standing during check for orthostatic pressures.  Note erythema of neck and face with increased dryness.  Change is speech with thickened tongue with noted redness.  Orthostatic pressures obtained.   He is drinking at least 1-2 cans of ensure and Nutren at least 3 cans per day.    124/58, pulse 89, temp 98.7 - Sitting BP 117/62 mmHg  Pulse 105  Temp(Src) 98.7 F (37.1 C) - Standing  Wt Readings from Last 3 Encounters:  12/23/14 167 lb 14.4 oz (76.159 kg)  12/17/14 168 lb 6.4 oz (76.386 kg)  12/16/14 168 lb 8 oz (76.431 kg)

## 2014-12-23 NOTE — Telephone Encounter (Signed)
  Oncology Nurse Navigator Documentation   Navigator Encounter Type: Telephone (12/23/14 1442)         Interventions: Medication assitance (12/23/14 1442)     Spoke with Angie at [facility].    She confirmed medications requested by this navigator on 10/12, 12/12/14 (clindamycin gel, hydrocortisone ointment, morphine liquid, lidocaine solution, sucralfate, prochlorperazine, ondansetron) and again on 12/19/14 (antiemetics only) were shipped by [employer] on 10/20 to dtr [facility] address (patient's temporary address).  Dtr/patient to be notified by this navigator this afternoon when he arrives for tomo and weekly UT.  [name], RN, BSN, [place] at [place] [phone]           Time Spent with Patient: 15 (12/23/14 1442)

## 2014-12-24 ENCOUNTER — Encounter: Payer: Self-pay | Admitting: Hematology and Oncology

## 2014-12-24 ENCOUNTER — Ambulatory Visit
Admission: RE | Admit: 2014-12-24 | Discharge: 2014-12-24 | Disposition: A | Discharge status: Home / Self Care | Payer: Non-veteran care | Source: Ambulatory Visit | Attending: Radiation Oncology | Admitting: Radiation Oncology

## 2014-12-24 ENCOUNTER — Ambulatory Visit (HOSPITAL_BASED_OUTPATIENT_CLINIC_OR_DEPARTMENT_OTHER): Payer: Medicare PPO | Admitting: Hematology and Oncology

## 2014-12-24 ENCOUNTER — Telehealth: Payer: Self-pay | Admitting: Hematology and Oncology

## 2014-12-24 VITALS — BP 130/71 | HR 91 | Temp 98.5°F | Resp 18 | Ht 69.0 in | Wt 167.6 lb

## 2014-12-24 DIAGNOSIS — K1231 Oral mucositis (ulcerative) due to antineoplastic therapy: Secondary | ICD-10-CM

## 2014-12-24 DIAGNOSIS — C01 Malignant neoplasm of base of tongue: Secondary | ICD-10-CM

## 2014-12-24 DIAGNOSIS — G4700 Insomnia, unspecified: Secondary | ICD-10-CM

## 2014-12-24 DIAGNOSIS — K9429 Other complications of gastrostomy: Secondary | ICD-10-CM

## 2014-12-24 DIAGNOSIS — E46 Unspecified protein-calorie malnutrition: Secondary | ICD-10-CM

## 2014-12-24 DIAGNOSIS — Z510 Encounter for antineoplastic radiation therapy: Secondary | ICD-10-CM | POA: Diagnosis not present

## 2014-12-24 DIAGNOSIS — R21 Rash and other nonspecific skin eruption: Secondary | ICD-10-CM | POA: Diagnosis not present

## 2014-12-24 DIAGNOSIS — Z09 Encounter for follow-up examination after completed treatment for conditions other than malignant neoplasm: Secondary | ICD-10-CM

## 2014-12-24 DIAGNOSIS — K9281 Gastrointestinal mucositis (ulcerative): Secondary | ICD-10-CM

## 2014-12-24 HISTORY — DX: Insomnia, unspecified: G47.00

## 2014-12-24 MED ORDER — ZOLPIDEM TARTRATE 10 MG PO TABS: 5.0000 mg | ORAL_TABLET | Freq: Every day | ORAL | Status: DC

## 2014-12-24 NOTE — Telephone Encounter (Signed)
per pof to sch pt appt-gave pt copy of avs-sent MW email to sch IV-pt to get updated sch after radiation

## 2014-12-25 ENCOUNTER — Encounter (INDEPENDENT_AMBULATORY_CARE_PROVIDER_SITE_OTHER): Payer: Self-pay

## 2014-12-25 ENCOUNTER — Ambulatory Visit (HOSPITAL_COMMUNITY): Payer: Medicaid - Dental | Admitting: Dentistry

## 2014-12-25 ENCOUNTER — Telehealth: Payer: Self-pay | Admitting: *Deleted

## 2014-12-25 ENCOUNTER — Encounter (HOSPITAL_COMMUNITY): Payer: Self-pay | Admitting: Dentistry

## 2014-12-25 ENCOUNTER — Ambulatory Visit: Payer: Medicare PPO | Admitting: Nutrition

## 2014-12-25 ENCOUNTER — Encounter: Payer: Self-pay | Admitting: *Deleted

## 2014-12-25 ENCOUNTER — Ambulatory Visit (HOSPITAL_BASED_OUTPATIENT_CLINIC_OR_DEPARTMENT_OTHER): Payer: Medicare PPO

## 2014-12-25 ENCOUNTER — Ambulatory Visit
Admission: RE | Admit: 2014-12-25 | Discharge: 2014-12-25 | Disposition: A | Discharge status: Home / Self Care | Payer: Non-veteran care | Source: Ambulatory Visit | Attending: Radiation Oncology | Admitting: Radiation Oncology

## 2014-12-25 VITALS — BP 138/80 | HR 105 | Temp 98.8°F | Wt 169.0 lb

## 2014-12-25 VITALS — BP 123/65 | HR 88 | Temp 99.6°F | Resp 18

## 2014-12-25 DIAGNOSIS — R682 Dry mouth, unspecified: Secondary | ICD-10-CM

## 2014-12-25 DIAGNOSIS — C01 Malignant neoplasm of base of tongue: Secondary | ICD-10-CM

## 2014-12-25 DIAGNOSIS — Z510 Encounter for antineoplastic radiation therapy: Secondary | ICD-10-CM | POA: Diagnosis not present

## 2014-12-25 DIAGNOSIS — Z5112 Encounter for antineoplastic immunotherapy: Secondary | ICD-10-CM | POA: Diagnosis not present

## 2014-12-25 DIAGNOSIS — Z0189 Encounter for other specified special examinations: Secondary | ICD-10-CM

## 2014-12-25 DIAGNOSIS — R634 Abnormal weight loss: Secondary | ICD-10-CM

## 2014-12-25 DIAGNOSIS — R252 Cramp and spasm: Secondary | ICD-10-CM

## 2014-12-25 DIAGNOSIS — R21 Rash and other nonspecific skin eruption: Secondary | ICD-10-CM

## 2014-12-25 DIAGNOSIS — E46 Unspecified protein-calorie malnutrition: Secondary | ICD-10-CM | POA: Insufficient documentation

## 2014-12-25 DIAGNOSIS — R432 Parageusia: Secondary | ICD-10-CM

## 2014-12-25 DIAGNOSIS — K117 Disturbances of salivary secretion: Secondary | ICD-10-CM

## 2014-12-25 DIAGNOSIS — K1230 Oral mucositis (ulcerative), unspecified: Secondary | ICD-10-CM

## 2014-12-25 DIAGNOSIS — R1310 Dysphagia, unspecified: Secondary | ICD-10-CM

## 2014-12-25 DIAGNOSIS — R070 Pain in throat: Secondary | ICD-10-CM

## 2014-12-25 MED ORDER — SODIUM CHLORIDE 0.9 % IV SOLN
Freq: Once | INTRAVENOUS | Status: AC
  Administered 2014-12-25: 12:00:00 via INTRAVENOUS

## 2014-12-25 MED ORDER — SODIUM CHLORIDE 0.9 % IJ SOLN
10.0000 mL | INTRAMUSCULAR | Status: DC | PRN
  Administered 2014-12-25: 10 mL
  Filled 2014-12-25: qty 10

## 2014-12-25 MED ORDER — METHYLPREDNISOLONE SODIUM SUCC 125 MG IJ SOLR
40.0000 mg | Freq: Once | INTRAMUSCULAR | Status: AC
  Administered 2014-12-25: 40 mg via INTRAVENOUS

## 2014-12-25 MED ORDER — METHYLPREDNISOLONE SODIUM SUCC 125 MG IJ SOLR
INTRAMUSCULAR | Status: AC
  Filled 2014-12-25: qty 2

## 2014-12-25 MED ORDER — CETUXIMAB CHEMO IV INJECTION 200 MG/100ML
125.0000 mg/m2 | Freq: Once | INTRAVENOUS | Status: AC
  Administered 2014-12-25: 200 mg via INTRAVENOUS
  Filled 2014-12-25: qty 100

## 2014-12-25 MED ORDER — DIPHENHYDRAMINE HCL 50 MG/ML IJ SOLN
12.5000 mg | Freq: Once | INTRAMUSCULAR | Status: AC
  Administered 2014-12-25: 12.5 mg via INTRAVENOUS

## 2014-12-25 MED ORDER — DIPHENHYDRAMINE HCL 50 MG/ML IJ SOLN
INTRAMUSCULAR | Status: AC
  Filled 2014-12-25: qty 1

## 2014-12-25 MED ORDER — SODIUM CHLORIDE 0.9 % IV SOLN
Freq: Once | INTRAVENOUS | Status: AC
  Administered 2014-12-25: 11:00:00 via INTRAVENOUS

## 2014-12-25 MED ORDER — HEPARIN SOD (PORK) LOCK FLUSH 100 UNIT/ML IV SOLN
500.0000 [IU] | Freq: Once | INTRAVENOUS | Status: AC | PRN
  Administered 2014-12-25: 500 [IU]
  Filled 2014-12-25: qty 5

## 2014-12-25 NOTE — Progress Notes (Signed)
12/25/2014  Patient Name:   Kenneth Jordan Date of Birth:   04/25/1935 Medical Record Number: [id number]  BP 138/80 mmHg  Pulse 105  Temp(Src) 98.8 F (37.1 C) (Oral)  Wt 169 lb (76.658 kg)   [name] presents for oral examination during chemoradiation therapy. Patient has completed 18/35 radiation treatments. 3 cycles of chemotherapy.  REVIEW OF CHIEF COMPLAINTS:  DRY MOUTH: Yes HARD TO SWALLOW: Yes  HURT TO SWALLOW: Yes TASTE CHANGES: No real taste remains SORES IN MOUTH: Yes TRISMUS: No problems with symptoms, but has lost opening. WEIGHT: 169 lbs  HOME OH REGIMEN:  BRUSHING: Twice a day FLOSSING: None RINSING: Using salt water and baking soda rinses. FLUORIDE: Using fluoride at bedtime, but "not always". TRISMUS EXERCISES:  Maximum interincisal opening: 33 mm down from 45 mm   DENTAL EXAM:  Oral Hygiene:(PLAQUE): Plaque noted. Oral hygiene improvement was highly suggested. Gave ultra soft toothbrush. LOCATION OF MUCOSITIS: Back of throat. Tongue and left and right buccal mucosa DESCRIPTION OF SALIVA: Decreased saliva. ANY EXPOSED BONE: None noted OTHER WATCHED AREAS: Previous extraction sites DX: Xerostomia, Dysgeusia, Dysphagia, Odynophagia, Weight Loss, Trimus and Mucositis  RECOMMENDATIONS: 1. Brush after meals and at bedtime.  Use fluoride at bedtime. 2. Use trismus exercises as directed. 3. Use Biotene Rinse or salt water/baking soda rinses. 4. Multiple sips of water as needed. 5. Return to clinic in two months for oral exam after chemoradiation therapy. Call if problems before then.  Lenn Cal, DDS

## 2014-12-25 NOTE — Assessment & Plan Note (Signed)
He has progressive worsening calorie malnutrition. Clinically he appears dehydrated and he feels dizzy. Yesterday, at the radiation oncologists office, he was hypotensive. He will continue close monitoring by dietitian. Recommend IV fluid support daily and he agreed to proceed.

## 2014-12-25 NOTE — Assessment & Plan Note (Signed)
He has worsening mucositis on his tongue, likely due to treatment, despite significant doses adjustment. I will continue on reduced dose cetuximab as above. He did fill the prescription of morphine sulfate does not like to take it because he does not like to be sedated. I reinforced the importance of taking pain medicine correctly so that he does not feel miserable with pain or discomfort. I will reassess pain management again next week. In the meantime, I will also provide IV Dilaudid as needed when he comes in for infusion fluid support.

## 2014-12-25 NOTE — Assessment & Plan Note (Signed)
She has cetuximab-induced skin rash. At the beginning of our conservation, his daughter says that he does not like to have the cream on because it caused stinging sensation on his skin. When I questioned him further, he stated that he has been using it as directed. There is no open sores or ulceration of the skin. I will continue on reduced dose of cetuximab as above. I also offered him potential option of discontinuation of treatment but after prolonged discussion, he is in agreement to proceed with treatment as scheduled.

## 2014-12-25 NOTE — Progress Notes (Signed)
Nutrition follow up completed with patient and daughter during infusion for tongue cancer. Weight is stable at 169 pounds Noted albumin of 2.6 reflecting inflammatory response. Patient tolerating Nutren 1.5, 4 bottles daily along with Ensure Plus BID providing greater than 90% estimated nutrition needs. Daughter remains frustrated with patient's disposition and attitude. M.D. recommending daily fluids for patient.  Daughter concerned about transportation for patient on a daily basis since she works as a Pharmacist, hospital and will not be able to bring him daily.  Nutrition Diagnosis:  Inadequate oral intake continues.  Intervention:  Recommended patient continue 4 bottles of Nutren 1.5 via feeding tube in 2 bottles Ensure Plus by mouth as tolerated. Encouraged additional fluid intake as well as free water flushes. Provided supportive listening to patient and daughter Referral social worker to assist with transportation as available.  Monitoring, evaluation, goals: Patient will tolerate tube feedings plus oral intake to promote weight maintenance.  Next visit: Wednesday, November 2, during infusion.  **Disclaimer: This note was dictated with voice recognition software. Similar sounding words can inadvertently be transcribed and this note may contain transcription errors which may not have been corrected upon publication of note.**

## 2014-12-25 NOTE — Telephone Encounter (Signed)
Per staff message and POF I have scheduled appts. Advised scheduler of appts and no availbale on 10/31. JMW

## 2014-12-25 NOTE — Assessment & Plan Note (Signed)
He is getting progressively miserable and weak while on treatment week by week. He is also somewhat noncompliant with treatment recommendations. I have a frank discussion with the patient and his daughter. To give him the maximum benefit, he will need to continue on treatment as scheduled. I recommend IV fluid support daily for the next 3 weeks. I am aware that this could put burden on his daughter who has to transport him back and forth with additional time to be spent here on IV fluid support and radiation but that would be the best thing for him as we can manage his pain, dehydration and nausea better. After a lot of discussion, he is in agreement with the plan of care.

## 2014-12-25 NOTE — Patient Instructions (Signed)
Dehydration, Adult Dehydration is when you lose more fluids from the body than you take in. Vital organs like the kidneys, brain, and heart cannot function without a proper amount of fluids and salt. Any loss of fluids from the body can cause dehydration.  CAUSES  1. Vomiting. 2. Diarrhea. 3. Excessive sweating. 4. Excessive urine output. 5. Fever. SYMPTOMS  Mild dehydration 1. Thirst. 2. Dry lips. 3. Slightly dry mouth. Moderate dehydration  Very dry mouth.  Sunken eyes.  Skin does not bounce back quickly when lightly pinched and released.  Dark urine and decreased urine production.  Decreased tear production.  Headache. Severe dehydration  Very dry mouth.  Extreme thirst.  Rapid, weak pulse (more than 100 beats per minute at rest).  Cold hands and feet.  Not able to sweat in spite of heat and temperature.  Rapid breathing.  Blue lips.  Confusion and lethargy.  Difficulty being awakened.  Minimal urine production.  No tears. DIAGNOSIS  Your caregiver will diagnose dehydration based on your symptoms and your exam. Blood and urine tests will help confirm the diagnosis. The diagnostic evaluation should also identify the cause of dehydration. TREATMENT  Treatment of mild or moderate dehydration can often be done at home by increasing the amount of fluids that you drink. It is best to drink small amounts of fluid more often. Drinking too much at one time can make vomiting worse. Refer to the home care instructions below. Severe dehydration needs to be treated at the hospital where you will probably be given intravenous (IV) fluids that contain water and electrolytes. HOME CARE INSTRUCTIONS   Ask your caregiver about specific rehydration instructions.  Drink enough fluids to keep your urine clear or pale yellow.  Drink small amounts frequently if you have nausea and vomiting.  Eat as you normally do.  Avoid:  Foods or drinks high in sugar.  Carbonated  drinks.  Juice.  Extremely hot or cold fluids.  Drinks with caffeine.  Fatty, greasy foods.  Alcohol.  Tobacco.  Overeating.  Gelatin desserts.  Wash your hands well to avoid spreading bacteria and viruses.  Only take over-the-counter or prescription medicines for pain, discomfort, or fever as directed by your caregiver.  Ask your caregiver if you should continue all prescribed and over-the-counter medicines.  Keep all follow-up appointments with your caregiver. SEEK MEDICAL CARE IF:  You have abdominal pain and it increases or stays in one area (localizes).  You have a rash, stiff neck, or severe headache.  You are irritable, sleepy, or difficult to awaken.  You are weak, dizzy, or extremely thirsty. SEEK IMMEDIATE MEDICAL CARE IF:   You are unable to keep fluids down or you get worse despite treatment.  You have frequent episodes of vomiting or diarrhea.  You have blood or green matter (bile) in your vomit.  You have blood in your stool or your stool looks black and tarry.  You have not urinated in 6 to 8 hours, or you have only urinated a small amount of very dark urine.  You have a fever.  You faint. MAKE SURE YOU:   Understand these instructions.  Will watch your condition.  Will get help right away if you are not doing well or get worse. Document Released: 02/15/2005 Document Revised: 05/10/2011 Document Reviewed: 10/05/2010 [facility] Patient Information 2015 [place], [place]. This information is not intended to replace advice given to you by your health care provider. Make sure you discuss any questions you have with your health care  provider.  [facility] Discharge Instructions for Patients Receiving Chemotherapy  Today you received the following chemotherapy agents Erbitux.  To help prevent nausea and vomiting after your treatment, we encourage you to take your nausea medication as directed.   If you develop nausea and vomiting  that is not controlled by your nausea medication, call the clinic.   BELOW ARE SYMPTOMS THAT SHOULD BE REPORTED IMMEDIATELY:  *FEVER GREATER THAN 100.5 F  *CHILLS WITH OR WITHOUT FEVER  NAUSEA AND VOMITING THAT IS NOT CONTROLLED WITH YOUR NAUSEA MEDICATION  *UNUSUAL SHORTNESS OF BREATH  *UNUSUAL BRUISING OR BLEEDING  TENDERNESS IN MOUTH AND THROAT WITH OR WITHOUT PRESENCE OF ULCERS  *URINARY PROBLEMS  *BOWEL PROBLEMS  UNUSUAL RASH Items with * indicate a potential emergency and should be followed up as soon as possible.  Feel free to call the clinic you have any questions or concerns. The clinic phone number is [phone].  Please show the [place] at check-in to the Emergency Department and triage nurse.

## 2014-12-25 NOTE — Assessment & Plan Note (Signed)
The feeding tube site looks okay without signs of infection   

## 2014-12-25 NOTE — Patient Instructions (Signed)
RECOMMENDATIONS: 1. Brush after meals and at bedtime.  Use fluoride at bedtime. 2. Use trismus exercises as directed. 3. Use Biotene Rinse or salt water/baking soda rinses. 4. Multiple sips of water as needed. 5. Return to clinic in two months for oral exam after chemoradiation therapy. Call if problems before then.  Ronald F. Kulinski, DDS  

## 2014-12-25 NOTE — Progress Notes (Signed)
  Oncology Nurse Navigator Documentation   Navigator Encounter Type: Treatment (12/25/14 1230) Patient Visit Type: Medonc (IVF) (12/25/14 1230)     To provide support and encouragement, care continuity and to assess for needs, met with patient in Infusion where he was receiving IVF.  His dtr [name] accompanied him. We discussed importance of hydration and nutrition during tmt.    He verbalized understanding that he can focus on increasing PEG nutritional supplementation during next couple of weeks since he is scheduled to receive daily IVF which will address hydration needs. [name] noted:  Remainder of medications received from [place].  IVF appt conflicts with her work schedule on Tuesdays, Thursdays and Fridays; appts need to be around 3:30 T and F, around 4:00 on Thursday if she brings him.  I noted I would check on adjustments. They understand I can be contacted as needs/concerns arise.  [name], RN, BSN, [place] [facility] [place] [phone]                    Time Spent with Patient: 30 (12/25/14 1230)

## 2014-12-25 NOTE — Progress Notes (Signed)
[facility] OFFICE PROGRESS NOTE  Patient Care Team: Pcp Not In System as PCP - General Leota Sauers, RN as Oncology Nurse Navigator Heath Lark, MD as Consulting Physician (Hematology and Oncology) Eppie Gibson, MD as Attending Physician (Radiation Oncology) Karie Mainland, RD as Dietitian (Nutrition)  SUMMARY OF ONCOLOGIC HISTORY: Oncology History   Cancer of base of tongue   Staging form: Lip and Oral Cavity, AJCC 7th Edition     Clinical stage from 11/08/2014: Stage IVA (T4a, N2c, M0) - Signed by Heath Lark, MD on 11/08/2014       Cancer of base of tongue ([place])   07/08/2014 Miscellaneous  the patient noted tongue swelling / mass   10/04/2014 Imaging  CT scan done at the [facility] show large tongue mass with bilateral neck lymphadenopathy   10/07/2014 Miscellaneous  he was seen at the [facility] for evaluation was noted to have tongue cancer   10/10/2014 Imaging  PET CT scan from [facility] showed extensive regional lymphadenopathy on the left side including supraclavicular lymph node involvement and possibly right jugulodigastric lymph node involvement.   10/24/2014 Procedure  he underwent laryngoscopy and biopsy. there is a large ulcerative tongue a mass on the left involving the tonsil, soft palate, epiglottis.   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 The dose of Erbitux is reduced by 50%    INTERVAL HISTORY: Please see below for problem oriented charting. He is prior to cycle 4 of  treatment. The patient is miserable. He is in pain but is not taking liquid morphine as frequent as he should because he does not like the way he feels. His daughter stated he is not complying using the prescription clindamycin cream but then the patient later stated that he is using it. At the radiation oncologist office yesterday, he was noted to be hypotensive. According to his daughter, he appears to be somewhat sedated on a regular basis and she is not able to give him as much nutritional supplement and water flushes through the feeding tube as much as she thinks he needs.  REVIEW OF SYSTEMS:   Constitutional: Denies fevers, chills or abnormal weight loss Eyes: Denies blurriness of vision Respiratory: Denies cough, dyspnea or wheezes Cardiovascular: Denies palpitation, chest discomfort or lower extremity swelling Gastrointestinal:  Denies nausea, heartburn or change in bowel habits Lymphatics: Denies new lymphadenopathy or easy bruising Neurological:Denies numbness, tingling Behavioral/Psych: Mood is stable, no new changes  All other systems were reviewed with the patient and are negative.  I have reviewed the past medical history, past surgical history, social history and family history with the patient and they are unchanged from previous note.  ALLERGIES:  is allergic to other.  MEDICATIONS:  Current Outpatient Prescriptions  Medication Sig Dispense Refill  . clindamycin (CLINDAGEL) 1 % gel Apply topically 2 (two) times daily. 60 g 2  . fluticasone (FLONASE) 50 MCG/ACT nasal spray Place 1 spray into the nose every morning.     Marland Kitchen HYDROcodone-acetaminophen (HYCET) 7.5-325  mg/15 ml solution Take 15 mLs by mouth every 6 (six) hours as needed for moderate pain. 180 mL 0  . hydrocortisone 1 % ointment Apply 1 application topically 2 (two) times daily. 1 large tube 56 g 0  . lidocaine (XYLOCAINE) 2 % solution Mix 1 part 2%viscous lidocaine,1part H2O.Swish and/or swallow 72mL of this  mixture,68min before meals and at bedtime, up to QID (Patient not taking: Reported on 12/17/2014) 100 mL 5  . lidocaine-prilocaine (EMLA) cream Apply to affected area once 30 g 3  . loratadine (CLARITIN) 10 MG tablet Take 10 mg by mouth daily as needed for allergies.    . Menthol, Topical Analgesic, 16 % LIQD Apply 1 application topically daily. Shoulder pain.    Marland Kitchen metoCLOPramide (REGLAN) 10 MG tablet Take 1 tablet (10 mg total) by mouth 4 (four) times daily -  before meals and at bedtime. 40 tablet 1  . morphine (ROXANOL) 20 MG/ML concentrated solution Place 0.5 mLs (10 mg total) into feeding tube every 2 (two) hours as needed for severe pain. 240 mL 0  . omeprazole (PRILOSEC) 20 MG capsule Take 20 mg by mouth at bedtime.     . ondansetron (ZOFRAN) 8 MG tablet Take 1 tablet (8 mg total) by mouth every 8 (eight) hours as needed for nausea. 30 tablet 3  . prochlorperazine (COMPAZINE) 10 MG tablet Take 1 tablet (10 mg total) by mouth every 6 (six) hours as needed. (Patient not taking: Reported on 12/16/2014) 30 tablet 3  . sodium fluoride (FLUORISHIELD) 1.1 % GEL dental gel Instill one drop of gel per tooth space of fluoride tray. Place over teeth for 5 minutes. Remove. Spit out excess. Repeat nightly 120 mL prn  . sucralfate (CARAFATE) 1 G tablet Dissolve 1 tablet in 58mL H2O and swallow up to QID,PRN sore throat. (Patient not taking: Reported on 12/16/2014) 60 tablet 5  . zolpidem (AMBIEN) 10 MG tablet Take 0.5 tablets (5 mg total) by mouth at bedtime. 30 tablet 0   No current facility-administered medications for this visit.    PHYSICAL EXAMINATION: ECOG PERFORMANCE STATUS: 2 - Symptomatic, <50% confined to bed  Filed Vitals:   12/24/14 1533  BP: 130/71  Pulse: 91  Temp: 98.5 F (36.9 C)  Resp: 18   Filed Weights   12/24/14 1533  Weight: 167 lb 9.6 oz (76.023 kg)    GENERAL:alert, no distress and comfortable. He looks miserable SKIN: There is persistent skin lesion consistent with  cetuximab-induced skin reaction, no ulceration is noted  EYES: normal, Conjunctiva are pink and non-injected, sclera clear OROPHARYNX: Noted mucositis, no thrush or ulceration. NECK: supple, thyroid normal size, non-tender, without nodularity LYMPH:  Persistent palpable lymphadenopathy, regressed in size LUNGS: clear to auscultation and percussion with normal breathing effort HEART: regular rate & rhythm and no murmurs and no lower extremity edema ABDOMEN:abdomen soft, non-tender and normal bowel sounds. Feeding tube site looks okay Musculoskeletal:no cyanosis of digits and no clubbing  NEURO: alert & oriented x 3 with fluent speech, no focal motor/sensory deficits  LABORATORY DATA:  I have reviewed the data as listed    Component Value Date/Time   NA 137 12/23/2014 1502   NA 134* 11/18/2014 1100   K 4.8 12/23/2014 1502   K 5.3* 11/18/2014 1100   CL 101 11/18/2014 1100   CO2 27 12/23/2014 1502   CO2 26 11/18/2014 1100   GLUCOSE 84 12/23/2014 1502   GLUCOSE 89 11/18/2014 1100   BUN 25.0 12/23/2014 1502   BUN  26* 11/18/2014 1100   CREATININE 0.9 12/23/2014 1502   CREATININE 1.44* 11/18/2014 1100   CALCIUM 9.3 12/23/2014 1502   CALCIUM 9.2 11/18/2014 1100   PROT 6.6 12/23/2014 1502   ALBUMIN 2.6* 12/23/2014 1502   AST 14 12/23/2014 1502   ALT 11 12/23/2014 1502   ALKPHOS 63 12/23/2014 1502   BILITOT <0.30 12/23/2014 1502   GFRNONAA 45* 11/18/2014 1100   GFRAA 53* 11/18/2014 1100    No results found for: SPEP, UPEP  Lab Results  Component Value Date   WBC 8.4 12/23/2014   NEUTROABS 6.2 12/23/2014   HGB 11.9* 12/23/2014   HCT 37.0* 12/23/2014   MCV 78.1* 12/23/2014   PLT 451* 12/23/2014      Chemistry      Component Value Date/Time   NA 137 12/23/2014 1502   NA 134* 11/18/2014 1100   K 4.8 12/23/2014 1502   K 5.3* 11/18/2014 1100   CL 101 11/18/2014 1100   CO2 27 12/23/2014 1502   CO2 26 11/18/2014 1100   BUN 25.0 12/23/2014 1502   BUN 26* 11/18/2014 1100    CREATININE 0.9 12/23/2014 1502   CREATININE 1.44* 11/18/2014 1100      Component Value Date/Time   CALCIUM 9.3 12/23/2014 1502   CALCIUM 9.2 11/18/2014 1100   ALKPHOS 63 12/23/2014 1502   AST 14 12/23/2014 1502   ALT 11 12/23/2014 1502   BILITOT <0.30 12/23/2014 1502      ASSESSMENT & PLAN:  Cancer of base of tongue ([place]) He is getting progressively miserable and weak while on treatment week by week. He is also somewhat noncompliant with treatment recommendations. I have a frank discussion with the patient and his daughter. To give him the maximum benefit, he will need to continue on treatment as scheduled. I recommend IV fluid support daily for the next 3 weeks. I am aware that this could put burden on his daughter who has to transport him back and forth with additional time to be spent here on IV fluid support and radiation but that would be the best thing for him as we can manage his pain, dehydration and nausea better. After a lot of discussion, he is in agreement with the plan of care.   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 I will continue on reduced dose cetuximab as above. He did fill the prescription of morphine sulfate does not like to take it because he does not like to be sedated. I reinforced the importance of taking pain medicine correctly so that he does not feel miserable with pain or discomfort. I will reassess pain management again next week. In the meantime, I will also provide IV Dilaudid as needed when he comes in for infusion fluid support.  Rash, skin She has cetuximab-induced skin rash. At the beginning of our conservation, his daughter says that he does not like to have the cream on because it caused stinging sensation on his skin. When I questioned him further, he stated that he has been using it as directed. There is no open sores or ulceration of the skin. I will  continue on reduced dose of cetuximab as above. I also offered him potential option of discontinuation of treatment but after prolonged discussion, he is in agreement to proceed with treatment as scheduled.  S/P gastrostomy tube (G tube) placement, follow-up exam The feeding tube site looks okay without signs of infection.    Protein  calorie malnutrition ([place]) He has progressive worsening calorie malnutrition. Clinically he appears dehydrated and he feels dizzy. Yesterday, at the radiation oncologists office, he was hypotensive. He will continue close monitoring by dietitian. Recommend IV fluid support daily and he agreed to proceed.   No orders of the defined types were placed in this encounter.   All questions were answered. The patient knows to call the clinic with any problems, questions or concerns. No barriers to learning was detected. I spent 30 minutes counseling the patient face to face. The total time spent in the appointment was 40 minutes and more than 50% was on counseling and review of test results     [facility], Cyrah Mclamb, MD 12/25/2014 8:16 AM  ,

## 2014-12-26 ENCOUNTER — Ambulatory Visit
Admission: RE | Admit: 2014-12-26 | Discharge: 2014-12-26 | Disposition: A | Discharge status: Home / Self Care | Payer: Non-veteran care | Source: Ambulatory Visit | Attending: Radiation Oncology | Admitting: Radiation Oncology

## 2014-12-26 ENCOUNTER — Telehealth: Payer: Self-pay | Admitting: *Deleted

## 2014-12-26 DIAGNOSIS — Z510 Encounter for antineoplastic radiation therapy: Secondary | ICD-10-CM | POA: Diagnosis not present

## 2014-12-26 NOTE — Telephone Encounter (Signed)
Per staff message and POF I have scheduled appts. Advised scheduler of appts. JMW  

## 2014-12-27 ENCOUNTER — Ambulatory Visit
Admission: RE | Admit: 2014-12-27 | Discharge: 2014-12-27 | Disposition: A | Discharge status: Home / Self Care | Payer: Non-veteran care | Source: Ambulatory Visit | Attending: Radiation Oncology | Admitting: Radiation Oncology

## 2014-12-27 ENCOUNTER — Ambulatory Visit
Admission: RE | Admit: 2014-12-27 | Discharge: 2014-12-27 | Disposition: A | Discharge status: Home / Self Care | Payer: Medicare PPO | Source: Ambulatory Visit | Attending: Radiation Oncology | Admitting: Radiation Oncology

## 2014-12-27 ENCOUNTER — Encounter: Payer: Self-pay | Admitting: Radiation Oncology

## 2014-12-27 ENCOUNTER — Ambulatory Visit (HOSPITAL_BASED_OUTPATIENT_CLINIC_OR_DEPARTMENT_OTHER): Payer: Medicare PPO

## 2014-12-27 ENCOUNTER — Telehealth: Payer: Self-pay | Admitting: Hematology and Oncology

## 2014-12-27 ENCOUNTER — Telehealth: Payer: Self-pay | Admitting: *Deleted

## 2014-12-27 VITALS — BP 138/69 | HR 100 | Temp 99.3°F | Resp 20

## 2014-12-27 VITALS — BP 111/63 | HR 94 | Temp 99.1°F | Ht 69.0 in | Wt 173.2 lb

## 2014-12-27 DIAGNOSIS — K1231 Oral mucositis (ulcerative) due to antineoplastic therapy: Secondary | ICD-10-CM

## 2014-12-27 DIAGNOSIS — E46 Unspecified protein-calorie malnutrition: Secondary | ICD-10-CM | POA: Diagnosis not present

## 2014-12-27 DIAGNOSIS — C01 Malignant neoplasm of base of tongue: Secondary | ICD-10-CM

## 2014-12-27 DIAGNOSIS — R070 Pain in throat: Secondary | ICD-10-CM

## 2014-12-27 DIAGNOSIS — Z510 Encounter for antineoplastic radiation therapy: Secondary | ICD-10-CM | POA: Diagnosis not present

## 2014-12-27 DIAGNOSIS — R21 Rash and other nonspecific skin eruption: Secondary | ICD-10-CM

## 2014-12-27 MED ORDER — SODIUM CHLORIDE 0.9 % IV SOLN
Freq: Once | INTRAVENOUS | Status: AC
  Administered 2014-12-27: 16:00:00 via INTRAVENOUS

## 2014-12-27 NOTE — Progress Notes (Addendum)
Kenneth Jordan has received 17 fractions to his bilateral neck and base of tongue.  Note bright erythema of face and neck with mucositis of the posterior/lateral tongue lesion.  [place] speech present.  Currently receiving 1000cc NS for hydration.  Taking Roxanol for pain.  BP 111/63 mmHg  Pulse 94  Temp(Src) 99.1 F (37.3 C)  Ht 5\' 9"  (1.753 m)  Wt 173 lb 3.2 oz (78.563 kg)  BMI 25.57 kg/m2   Wt Readings from Last 3 Encounters:  12/27/14 173 lb 3.2 oz (78.563 kg)  12/25/14 169 lb (76.658 kg)  12/24/14 167 lb 9.6 oz (76.023 kg)

## 2014-12-27 NOTE — Telephone Encounter (Signed)
per reply from MW added fluids-cld pt daughter Kenneth Jordan to adv she stated she would stop by to get updated copy of sch on 10/27-cld and left message to advice of time of IV fluids today.Adv to call to acknowledge call and to get updated sch for remaining times & dates for IV fluids

## 2014-12-27 NOTE — Progress Notes (Signed)
Weekly Management Note:  Outpatient    ICD-9-CM ICD-10-CM   1. Cancer of base of tongue (HCC) 141.0 C01     Current Dose:  34 Gy  Projected Dose: 70 Gy   Narrative:  The patient presents for routine under treatment assessment.  CBCT/MVCT images/Port film x-rays were reviewed.  The chart was checked.  Kenneth Jordan has received 17 fractions to his bilateral neck and base of tongue. He is currently receiving 1000cc NS for hydration and taking Roxanol for pain.  Feels bad, but does not articulate a specific symptom that bothers him most   Physical Findings:  Wt Readings from Last 3 Encounters:  12/27/14 173 lb 3.2 oz (78.563 kg)  12/25/14 169 lb (76.658 kg)  12/24/14 167 lb 9.6 oz (76.023 kg)    height is 5\' 9"  (1.753 m) and weight is 173 lb 3.2 oz (78.563 kg). His temperature is 99.1 F (37.3 C). His blood pressure is 111/63 and his pulse is 94.   The patient presents to the clinic in a wheelchair. Patchy mucositis and erythema over his oral cavity including the left side of the oral tongue. Mucus membranes are moist, no thrush. Erythematous rash over the neck and face.  CBC    Component Value Date/Time   WBC 8.4 12/23/2014 1502   WBC 8.4 11/18/2014 1100   RBC 4.73 12/23/2014 1502   RBC 4.57 11/18/2014 1100   HGB 11.9* 12/23/2014 1502   HGB 12.0* 11/18/2014 1100   HCT 37.0* 12/23/2014 1502   HCT 37.7* 11/18/2014 1100   PLT 451* 12/23/2014 1502   PLT 352 11/18/2014 1100   MCV 78.1* 12/23/2014 1502   MCV 82.5 11/18/2014 1100   MCH 25.2* 12/23/2014 1502   MCH 26.3 11/18/2014 1100   MCHC 32.3 12/23/2014 1502   MCHC 31.8 11/18/2014 1100   RDW 15.1* 12/23/2014 1502   RDW 13.7 11/18/2014 1100   LYMPHSABS 1.0 12/23/2014 1502   MONOABS 0.8 12/23/2014 1502   EOSABS 0.3 12/23/2014 1502   BASOSABS 0.1 12/23/2014 1502     CMP     Component Value Date/Time   NA 137 12/23/2014 1502   NA 134* 11/18/2014 1100   K 4.8 12/23/2014 1502   K 5.3* 11/18/2014 1100   CL 101 11/18/2014  1100   CO2 27 12/23/2014 1502   CO2 26 11/18/2014 1100   GLUCOSE 84 12/23/2014 1502   GLUCOSE 89 11/18/2014 1100   BUN 25.0 12/23/2014 1502   BUN 26* 11/18/2014 1100   CREATININE 0.9 12/23/2014 1502   CREATININE 1.44* 11/18/2014 1100   CALCIUM 9.3 12/23/2014 1502   CALCIUM 9.2 11/18/2014 1100   PROT 6.6 12/23/2014 1502   ALBUMIN 2.6* 12/23/2014 1502   AST 14 12/23/2014 1502   ALT 11 12/23/2014 1502   ALKPHOS 63 12/23/2014 1502   BILITOT <0.30 12/23/2014 1502   GFRNONAA 45* 11/18/2014 1100   GFRAA 53* 11/18/2014 1100   Impression:  The patient is tolerating radiotherapy.   Plan:  Continue radiotherapy as planned. I stressed the importance of food and fluid intake through his feeding tube. Daughter present, supportive.  This document serves as a record of services personally performed by Eppie Gibson, MD. It was created on her behalf by Darcus Austin, a trained medical scribe. The creation of this record is based on the scribe's personal observations and the provider's statements to them. This document has been checked and approved by the attending provider.     -----------------------------------  Eppie Gibson, MD

## 2014-12-27 NOTE — Telephone Encounter (Signed)
CALLED PATIENT TO INFORM THAT DR. SQUIRE WOULD SEE HIM AFTER [place], LVM FOR A RETURN CALL

## 2014-12-28 ENCOUNTER — Other Ambulatory Visit: Payer: Self-pay

## 2014-12-28 ENCOUNTER — Ambulatory Visit (HOSPITAL_BASED_OUTPATIENT_CLINIC_OR_DEPARTMENT_OTHER): Payer: Medicare PPO

## 2014-12-28 VITALS — BP 153/71 | HR 105 | Temp 99.4°F | Resp 20

## 2014-12-28 DIAGNOSIS — R070 Pain in throat: Secondary | ICD-10-CM

## 2014-12-28 DIAGNOSIS — I10 Essential (primary) hypertension: Secondary | ICD-10-CM | POA: Diagnosis not present

## 2014-12-28 DIAGNOSIS — C01 Malignant neoplasm of base of tongue: Secondary | ICD-10-CM | POA: Diagnosis not present

## 2014-12-28 DIAGNOSIS — Z510 Encounter for antineoplastic radiation therapy: Secondary | ICD-10-CM | POA: Diagnosis not present

## 2014-12-28 DIAGNOSIS — E46 Unspecified protein-calorie malnutrition: Secondary | ICD-10-CM

## 2014-12-28 DIAGNOSIS — R21 Rash and other nonspecific skin eruption: Secondary | ICD-10-CM

## 2014-12-28 DIAGNOSIS — F1010 Alcohol abuse, uncomplicated: Secondary | ICD-10-CM | POA: Diagnosis not present

## 2014-12-28 MED ORDER — HYDROMORPHONE HCL 4 MG/ML IJ SOLN
INTRAMUSCULAR | Status: AC
  Filled 2014-12-28: qty 1

## 2014-12-28 MED ORDER — SODIUM CHLORIDE 0.9 % IV SOLN
Freq: Once | INTRAVENOUS | Status: AC
  Administered 2014-12-28: 09:00:00 via INTRAVENOUS

## 2014-12-28 MED ORDER — HEPARIN SOD (PORK) LOCK FLUSH 100 UNIT/ML IV SOLN
500.0000 [IU] | Freq: Once | INTRAVENOUS | Status: AC | PRN
  Administered 2014-12-28: 500 [IU]
  Filled 2014-12-28: qty 5

## 2014-12-28 MED ORDER — SODIUM CHLORIDE 0.9 % IJ SOLN
10.0000 mL | INTRAMUSCULAR | Status: DC | PRN
  Administered 2014-12-28: 10 mL
  Filled 2014-12-28: qty 10

## 2014-12-28 MED ORDER — HYDROMORPHONE HCL 4 MG/ML IJ SOLN
1.0000 mg | INTRAMUSCULAR | Status: DC | PRN
  Administered 2014-12-28: 1 mg via INTRAVENOUS

## 2014-12-30 ENCOUNTER — Ambulatory Visit: Payer: Self-pay

## 2014-12-30 ENCOUNTER — Telehealth: Payer: Self-pay

## 2014-12-30 ENCOUNTER — Other Ambulatory Visit: Payer: Self-pay

## 2014-12-30 ENCOUNTER — Telehealth: Payer: Self-pay | Admitting: Hematology and Oncology

## 2014-12-30 ENCOUNTER — Ambulatory Visit: Payer: Non-veteran care

## 2014-12-30 ENCOUNTER — Emergency Department (HOSPITAL_COMMUNITY): Payer: Medicare PPO

## 2014-12-30 ENCOUNTER — Ambulatory Visit: Payer: Self-pay | Admitting: Hematology and Oncology

## 2014-12-30 ENCOUNTER — Encounter (HOSPITAL_COMMUNITY): Payer: Self-pay | Admitting: *Deleted

## 2014-12-30 ENCOUNTER — Inpatient Hospital Stay (HOSPITAL_COMMUNITY)
Admission: EM | Admit: 2014-12-30 | Discharge: 2015-01-03 | DRG: 871 | Disposition: A | Discharge status: Home Health | Payer: Medicare PPO | Attending: Family Medicine | Admitting: Family Medicine

## 2014-12-30 ENCOUNTER — Ambulatory Visit: Payer: Medicare PPO

## 2014-12-30 ENCOUNTER — Ambulatory Visit: Payer: Medicare PPO | Admitting: Radiation Oncology

## 2014-12-30 ENCOUNTER — Ambulatory Visit
Admit: 2014-12-30 | Discharge: 2014-12-30 | Disposition: A | Discharge status: Home / Self Care | Payer: Non-veteran care | Attending: Radiation Oncology | Admitting: Radiation Oncology

## 2014-12-30 DIAGNOSIS — M1990 Unspecified osteoarthritis, unspecified site: Secondary | ICD-10-CM | POA: Diagnosis present

## 2014-12-30 DIAGNOSIS — G9340 Encephalopathy, unspecified: Secondary | ICD-10-CM | POA: Diagnosis present

## 2014-12-30 DIAGNOSIS — K1231 Oral mucositis (ulcerative) due to antineoplastic therapy: Secondary | ICD-10-CM | POA: Diagnosis not present

## 2014-12-30 DIAGNOSIS — F329 Major depressive disorder, single episode, unspecified: Secondary | ICD-10-CM | POA: Diagnosis present

## 2014-12-30 DIAGNOSIS — K219 Gastro-esophageal reflux disease without esophagitis: Secondary | ICD-10-CM | POA: Diagnosis present

## 2014-12-30 DIAGNOSIS — Z885 Allergy status to narcotic agent status: Secondary | ICD-10-CM | POA: Diagnosis not present

## 2014-12-30 DIAGNOSIS — F419 Anxiety disorder, unspecified: Secondary | ICD-10-CM | POA: Diagnosis present

## 2014-12-30 DIAGNOSIS — Z79899 Other long term (current) drug therapy: Secondary | ICD-10-CM | POA: Diagnosis not present

## 2014-12-30 DIAGNOSIS — C01 Malignant neoplasm of base of tongue: Secondary | ICD-10-CM | POA: Diagnosis present

## 2014-12-30 DIAGNOSIS — R21 Rash and other nonspecific skin eruption: Secondary | ICD-10-CM | POA: Diagnosis present

## 2014-12-30 DIAGNOSIS — K1230 Oral mucositis (ulcerative), unspecified: Secondary | ICD-10-CM | POA: Diagnosis present

## 2014-12-30 DIAGNOSIS — W19XXXA Unspecified fall, initial encounter: Secondary | ICD-10-CM | POA: Diagnosis present

## 2014-12-30 DIAGNOSIS — Z8249 Family history of ischemic heart disease and other diseases of the circulatory system: Secondary | ICD-10-CM | POA: Diagnosis not present

## 2014-12-30 DIAGNOSIS — R410 Disorientation, unspecified: Secondary | ICD-10-CM | POA: Diagnosis present

## 2014-12-30 DIAGNOSIS — N390 Urinary tract infection, site not specified: Secondary | ICD-10-CM | POA: Diagnosis present

## 2014-12-30 DIAGNOSIS — R1310 Dysphagia, unspecified: Secondary | ICD-10-CM | POA: Diagnosis not present

## 2014-12-30 DIAGNOSIS — Z931 Gastrostomy status: Secondary | ICD-10-CM

## 2014-12-30 DIAGNOSIS — E860 Dehydration: Secondary | ICD-10-CM | POA: Diagnosis present

## 2014-12-30 DIAGNOSIS — Z79891 Long term (current) use of opiate analgesic: Secondary | ICD-10-CM

## 2014-12-30 DIAGNOSIS — R509 Fever, unspecified: Secondary | ICD-10-CM | POA: Diagnosis not present

## 2014-12-30 DIAGNOSIS — H3552 Pigmentary retinal dystrophy: Secondary | ICD-10-CM | POA: Diagnosis present

## 2014-12-30 DIAGNOSIS — Z510 Encounter for antineoplastic radiation therapy: Secondary | ICD-10-CM | POA: Diagnosis not present

## 2014-12-30 DIAGNOSIS — J189 Pneumonia, unspecified organism: Secondary | ICD-10-CM | POA: Diagnosis present

## 2014-12-30 DIAGNOSIS — E871 Hypo-osmolality and hyponatremia: Secondary | ICD-10-CM | POA: Diagnosis present

## 2014-12-30 DIAGNOSIS — Z808 Family history of malignant neoplasm of other organs or systems: Secondary | ICD-10-CM | POA: Diagnosis not present

## 2014-12-30 DIAGNOSIS — D509 Iron deficiency anemia, unspecified: Secondary | ICD-10-CM

## 2014-12-30 DIAGNOSIS — T451X5A Adverse effect of antineoplastic and immunosuppressive drugs, initial encounter: Secondary | ICD-10-CM | POA: Diagnosis present

## 2014-12-30 DIAGNOSIS — R1319 Other dysphagia: Secondary | ICD-10-CM | POA: Diagnosis present

## 2014-12-30 DIAGNOSIS — E878 Other disorders of electrolyte and fluid balance, not elsewhere classified: Secondary | ICD-10-CM | POA: Diagnosis present

## 2014-12-30 DIAGNOSIS — Z6824 Body mass index (BMI) 24.0-24.9, adult: Secondary | ICD-10-CM | POA: Diagnosis not present

## 2014-12-30 DIAGNOSIS — I10 Essential (primary) hypertension: Secondary | ICD-10-CM | POA: Diagnosis present

## 2014-12-30 DIAGNOSIS — B964 Proteus (mirabilis) (morganii) as the cause of diseases classified elsewhere: Secondary | ICD-10-CM | POA: Diagnosis present

## 2014-12-30 DIAGNOSIS — B370 Candidal stomatitis: Secondary | ICD-10-CM | POA: Diagnosis present

## 2014-12-30 DIAGNOSIS — R5081 Fever presenting with conditions classified elsewhere: Secondary | ICD-10-CM | POA: Diagnosis not present

## 2014-12-30 DIAGNOSIS — A419 Sepsis, unspecified organism: Secondary | ICD-10-CM | POA: Diagnosis present

## 2014-12-30 DIAGNOSIS — G4700 Insomnia, unspecified: Secondary | ICD-10-CM | POA: Diagnosis present

## 2014-12-30 DIAGNOSIS — E46 Unspecified protein-calorie malnutrition: Secondary | ICD-10-CM | POA: Diagnosis present

## 2014-12-30 LAB — URINALYSIS, ROUTINE W REFLEX MICROSCOPIC
Bilirubin Urine: NEGATIVE
GLUCOSE, UA: NEGATIVE mg/dL
HGB URINE DIPSTICK: NEGATIVE
KETONES UR: NEGATIVE mg/dL
Leukocytes, UA: NEGATIVE
Nitrite: NEGATIVE
PROTEIN: 30 mg/dL — AB
Specific Gravity, Urine: 1.016 (ref 1.005–1.030)
UROBILINOGEN UA: 1 mg/dL (ref 0.0–1.0)
pH: 7 (ref 5.0–8.0)

## 2014-12-30 LAB — CBC WITH DIFFERENTIAL/PLATELET
BASOS PCT: 0 %
Basophils Absolute: 0 10*3/uL (ref 0.0–0.1)
EOS ABS: 0 10*3/uL (ref 0.0–0.7)
EOS PCT: 0 %
HCT: 34.7 % — ABNORMAL LOW (ref 39.0–52.0)
Hemoglobin: 10.9 g/dL — ABNORMAL LOW (ref 13.0–17.0)
Lymphocytes Relative: 4 %
Lymphs Abs: 0.7 10*3/uL (ref 0.7–4.0)
MCH: 24.9 pg — ABNORMAL LOW (ref 26.0–34.0)
MCHC: 31.4 g/dL (ref 30.0–36.0)
MCV: 79.4 fL (ref 78.0–100.0)
MONO ABS: 0.9 10*3/uL (ref 0.1–1.0)
MONOS PCT: 5 %
Neutro Abs: 15.1 10*3/uL — ABNORMAL HIGH (ref 1.7–7.7)
Neutrophils Relative %: 91 %
PLATELETS: 357 10*3/uL (ref 150–400)
RBC: 4.37 MIL/uL (ref 4.22–5.81)
RDW: 14.7 % (ref 11.5–15.5)
WBC: 16.8 10*3/uL — ABNORMAL HIGH (ref 4.0–10.5)

## 2014-12-30 LAB — COMPREHENSIVE METABOLIC PANEL
ALT: 15 U/L — ABNORMAL LOW (ref 17–63)
AST: 19 U/L (ref 15–41)
Albumin: 2.8 g/dL — ABNORMAL LOW (ref 3.5–5.0)
Alkaline Phosphatase: 51 U/L (ref 38–126)
Anion gap: 9 (ref 5–15)
BILIRUBIN TOTAL: 0.6 mg/dL (ref 0.3–1.2)
BUN: 21 mg/dL — AB (ref 6–20)
CO2: 25 mmol/L (ref 22–32)
CREATININE: 0.91 mg/dL (ref 0.61–1.24)
Calcium: 8.5 mg/dL — ABNORMAL LOW (ref 8.9–10.3)
Chloride: 98 mmol/L — ABNORMAL LOW (ref 101–111)
Glucose, Bld: 114 mg/dL — ABNORMAL HIGH (ref 65–99)
POTASSIUM: 4.4 mmol/L (ref 3.5–5.1)
Sodium: 132 mmol/L — ABNORMAL LOW (ref 135–145)
TOTAL PROTEIN: 6.6 g/dL (ref 6.5–8.1)

## 2014-12-30 LAB — PROTIME-INR
INR: 1.11 (ref 0.00–1.49)
PROTHROMBIN TIME: 14.4 s (ref 11.6–15.2)

## 2014-12-30 LAB — I-STAT CG4 LACTIC ACID, ED
LACTIC ACID, VENOUS: 1.49 mmol/L (ref 0.5–2.0)
LACTIC ACID, VENOUS: 1.2 mmol/L (ref 0.5–2.0)

## 2014-12-30 LAB — URINE MICROSCOPIC-ADD ON

## 2014-12-30 LAB — APTT: APTT: 33 s (ref 24–37)

## 2014-12-30 LAB — PROCALCITONIN

## 2014-12-30 LAB — LACTIC ACID, PLASMA: LACTIC ACID, VENOUS: 0.8 mmol/L (ref 0.5–2.0)

## 2014-12-30 MED ORDER — HEPARIN SODIUM (PORCINE) 5000 UNIT/ML IJ SOLN
5000.0000 [IU] | Freq: Three times a day (TID) | INTRAMUSCULAR | Status: DC
  Administered 2014-12-30 – 2015-01-03 (×12): 5000 [IU] via SUBCUTANEOUS
  Filled 2014-12-30 (×12): qty 1

## 2014-12-30 MED ORDER — AZITHROMYCIN 250 MG PO TABS: 500.0000 mg | ORAL_TABLET | ORAL | Status: DC

## 2014-12-30 MED ORDER — PIPERACILLIN-TAZOBACTAM 3.375 G IVPB
3.3750 g | Freq: Three times a day (TID) | INTRAVENOUS | Status: DC
  Administered 2014-12-31 (×2): 3.375 g via INTRAVENOUS
  Filled 2014-12-30: qty 50

## 2014-12-30 MED ORDER — KETOROLAC TROMETHAMINE 15 MG/ML IJ SOLN: 15.0000 mg | Freq: Four times a day (QID) | INTRAMUSCULAR | Status: DC | PRN

## 2014-12-30 MED ORDER — FLUCONAZOLE IN SODIUM CHLORIDE 100-0.9 MG/50ML-% IV SOLN
100.0000 mg | INTRAVENOUS | Status: DC
  Administered 2014-12-31 – 2015-01-02 (×3): 100 mg via INTRAVENOUS
  Filled 2014-12-30 (×4): qty 50

## 2014-12-30 MED ORDER — ZOLPIDEM TARTRATE 5 MG PO TABS
5.0000 mg | ORAL_TABLET | Freq: Every day | ORAL | Status: DC
  Administered 2015-01-02: 5 mg via ORAL
  Filled 2014-12-30 (×2): qty 1

## 2014-12-30 MED ORDER — SODIUM CHLORIDE 0.9 % IV BOLUS (SEPSIS)
1000.0000 mL | Freq: Once | INTRAVENOUS | Status: AC
  Administered 2014-12-30: 1000 mL via INTRAVENOUS

## 2014-12-30 MED ORDER — PIPERACILLIN-TAZOBACTAM 3.375 G IVPB: 3.3750 g | Freq: Once | INTRAVENOUS | Status: DC

## 2014-12-30 MED ORDER — ONDANSETRON HCL 4 MG/2ML IJ SOLN: 4.0000 mg | Freq: Four times a day (QID) | INTRAMUSCULAR | Status: DC | PRN

## 2014-12-30 MED ORDER — VANCOMYCIN HCL IN DEXTROSE 1-5 GM/200ML-% IV SOLN
1000.0000 mg | Freq: Two times a day (BID) | INTRAVENOUS | Status: DC
  Administered 2014-12-31: 1000 mg via INTRAVENOUS
  Filled 2014-12-30: qty 200

## 2014-12-30 MED ORDER — ACETAMINOPHEN 160 MG/5ML PO SOLN
ORAL | Status: AC
  Administered 2014-12-30: 650 mg
  Filled 2014-12-30: qty 20.3

## 2014-12-30 MED ORDER — POLYETHYLENE GLYCOL 3350 17 G PO PACK: 17.0000 g | PACK | Freq: Every day | ORAL | Status: DC | PRN

## 2014-12-30 MED ORDER — IOHEXOL 300 MG/ML  SOLN
75.0000 mL | Freq: Once | INTRAMUSCULAR | Status: AC | PRN
  Administered 2014-12-30: 75 mL via INTRAVENOUS

## 2014-12-30 MED ORDER — FLUCONAZOLE IN SODIUM CHLORIDE 200-0.9 MG/100ML-% IV SOLN
200.0000 mg | Freq: Once | INTRAVENOUS | Status: AC
  Administered 2014-12-30: 200 mg via INTRAVENOUS
  Filled 2014-12-30: qty 100

## 2014-12-30 MED ORDER — SODIUM CHLORIDE 0.9 % IV SOLN
INTRAVENOUS | Status: DC
  Administered 2014-12-30 – 2014-12-31 (×2): via INTRAVENOUS

## 2014-12-30 MED ORDER — DEXTROSE 5 % IV SOLN: 1.0000 g | INTRAVENOUS | Status: DC

## 2014-12-30 MED ORDER — LIDOCAINE VISCOUS 2 % MT SOLN
15.0000 mL | OROMUCOSAL | Status: DC | PRN
  Administered 2015-01-02: 15 mL via OROMUCOSAL
  Filled 2014-12-30 (×3): qty 15

## 2014-12-30 MED ORDER — SODIUM CHLORIDE 0.9 % IV BOLUS (SEPSIS)
250.0000 mL | Freq: Once | INTRAVENOUS | Status: AC
  Administered 2014-12-30: 250 mL via INTRAVENOUS

## 2014-12-30 MED ORDER — PIPERACILLIN-TAZOBACTAM 3.375 G IVPB 30 MIN
3.3750 g | Freq: Once | INTRAVENOUS | Status: AC
  Administered 2014-12-30: 3.375 g via INTRAVENOUS
  Filled 2014-12-30: qty 50

## 2014-12-30 MED ORDER — ONDANSETRON HCL 4 MG PO TABS: 4.0000 mg | ORAL_TABLET | Freq: Four times a day (QID) | ORAL | Status: DC | PRN

## 2014-12-30 MED ORDER — SUCRALFATE 1 GM/10ML PO SUSP: 1.0000 g | Freq: Four times a day (QID) | ORAL | Status: DC | PRN

## 2014-12-30 MED ORDER — ACETAMINOPHEN 325 MG PO TABS: 650.0000 mg | ORAL_TABLET | Freq: Once | ORAL | Status: AC

## 2014-12-30 MED ORDER — VANCOMYCIN HCL IN DEXTROSE 1-5 GM/200ML-% IV SOLN
1000.0000 mg | Freq: Once | INTRAVENOUS | Status: AC
  Administered 2014-12-30: 1000 mg via INTRAVENOUS
  Filled 2014-12-30: qty 200

## 2014-12-30 NOTE — H&P (Addendum)
[facility] History and Physical     History and Physical:    [name]   OEH:[id number] DOB: Jul 31, 1935 DOA: 12/30/2014  Referring MD/provider: Dr. Verta Ellen PCP: Pcp Not In System   Chief Complaint: confusion  History of Present Illness:   Kenneth Jordan is an 79 y.o. 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 Most of the history is obtained from the daughter relates that he has been confused since then progressively getting worse. He felt at the house the day prior to admission and cut himself above the left eyebrow. She denies that he has been complaining of any shortness of breath he's had some cough over the last 4 days, some fever at home. He is not wanted to eat, he obtains most of his feeding through his feeding tube.  In the ED: He was found to be hyponatremic hypochloremic with elevated white blood cell count left shift chest x-ray shows no acute abnormalities, his lactic acid was 1.4 CT of the head and neck shows no acute abnormality recently reconsulted for further evaluation  ROS:   ROS  Constitutional: No fever, no chills;  Appetite normal; No weight loss, no weight gain, no fatigue.   HEENT: No blurry vision, no diplopia, no pharyngitis, no dysphagia  CV: No chest pain, no palpitations, no PND, no orthopnea, no edema.   Resp: No SOB, no cough, no pleuritic pain.  GI: No nausea, no vomiting, no diarrhea, no melena, no hematochezia, no constipation, no abdominal pain.   GU: No dysuria, no hematuria, no frequency, no urgency.  MSK: No myalgias, no arthralgias.   Neuro:  No headache, no focal neurological deficits, no history of seizures.   Psych: No  depression, no anxiety.   Endo: No heat intolerance, no cold intolerance, no polyuria, no polydipsia   Skin: No rashes, no skin lesions.   Heme: No easy bruising.   Travel history: No recent travel.   Past Medical History:   Past Medical History  Diagnosis Date  . 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 Hypertension   . Retinitis pigmentosa of both eyes   . Anxiety   . Depression   . GERD (gastroesophageal reflux disease)     hx of   . Arthritis     shoulders, right knee   . Insomnia 12/24/2014    Past Surgical History:   Past Surgical History  Procedure Laterality Date  . Biopsy tongue  10/24/14  . Knee surgery Right   . Tonsilectomy, adenoidectomy, bilateral myringotomy and tubes    . Feedign tube srugery     . Multiple extractions with alveoloplasty N/A 11/20/2014    Procedure: Extraction of tooth #'s 1,15,16,17,20,31 with alveoloplasty and gross debridement of remaining teeth;  Surgeon: Lenn Cal, DDS;  Location: [facility];  Service: Oral Surgery;  Laterality: N/A;    Social History:   Social History   Social History  . Marital Status: Widowed    Spouse Name: N/A  . Number of Children: 2  . Years of Education: N/A   Occupational History  . Acupuncturist    Social History Main Topics  .  Smoking status: Never Smoker   . Smokeless tobacco: Never Used  . Alcohol Use: No     Comment: hx of occ drink   . Drug Use: No  . Sexual Activity: No   Other Topics Concern  . Not on file   Social History Narrative    Family history:   Family History  Problem Relation Age of Onset  . Ovarian cancer Sister   . Heart attack Father   . Breast cancer      neice on Sister's side.  Also had tongue cancer.    Allergies   Dilaudid and Other  Current Medications:   Prior to Admission medications   Medication Sig Start Date End Date Taking? Authorizing Provider  acetaminophen (TYLENOL) 325 MG tablet Take 650 mg by mouth every 6 (six) hours as  needed (For pain.).   Yes Historical Provider, MD  clindamycin (CLINDAGEL) 1 % gel Apply topically 2 (two) times daily. 12/17/14  Yes Heath Lark, MD  fluticasone (FLONASE) 50 MCG/ACT nasal spray Place 1 spray into the nose every morning.    Yes Historical Provider, MD  HYDROcodone-acetaminophen (HYCET) 7.5-325 mg/15 ml solution Take 15 mLs by mouth every 6 (six) hours as needed for moderate pain. 11/20/14 11/20/15 Yes Lenn Cal, DDS  hydrocortisone 1 % ointment Apply 1 application topically 2 (two) times daily. 1 large tube 12/10/14  Yes Ni Gorsuch, MD  lidocaine (XYLOCAINE) 2 % solution Mix 1 part 2%viscous lidocaine,1part H2O.Swish and/or swallow 35mL of this mixture,25min before meals and at bedtime, up to QID 12/09/14  Yes Eppie Gibson, MD  lidocaine-prilocaine (EMLA) cream Apply to affected area once Patient taking differently: Apply 1 application topically daily as needed (For port-a-cath.).  12/02/14  Yes Heath Lark, MD  loratadine (CLARITIN) 10 MG tablet Take 10 mg by mouth daily as needed for allergies.   Yes Historical Provider, MD  Menthol, Topical Analgesic, 16 % LIQD Apply 1 application topically daily. Applies for shoulder pain.   Yes Historical Provider, MD  metoCLOPramide (REGLAN) 10 MG tablet Take 1 tablet (10 mg total) by mouth 4 (four) times daily -  before meals and at bedtime. 12/18/14  Yes Heath Lark, MD  morphine (ROXANOL) 20 MG/ML concentrated solution Place 0.5 mLs (10 mg total) into feeding tube every 2 (two) hours as needed for severe pain. 12/17/14  Yes Heath Lark, MD  omeprazole (PRILOSEC) 20 MG capsule Take 20 mg by mouth at bedtime as needed (For heartburn or acid reflux.).    Yes Historical Provider, MD  ondansetron (ZOFRAN) 8 MG tablet Take 1 tablet (8 mg total) by mouth every 8 (eight) hours as needed for nausea. 12/05/14  Yes Heath Lark, MD  PRESCRIPTION MEDICATION Patient receives his chemo treatments at the [facility] with Dr.  Alvy Bimler. He is receiving Cetuximab every 7 days. His last dose was on 12/25/14.   Yes Historical Provider, MD  prochlorperazine (COMPAZINE) 10 MG tablet Take 1 tablet (10 mg total) by mouth every 6 (six) hours as needed. Patient taking differently: Take 10 mg by mouth every 6 (six) hours as needed for nausea or vomiting.  12/05/14  Yes Heath Lark, MD  sodium fluoride (FLUORISHIELD) 1.1 % GEL dental gel Instill one drop of gel per tooth space of fluoride tray. Place over teeth for 5 minutes. Remove. Spit out excess. Repeat nightly 11/27/14  Yes Lenn Cal, DDS  sucralfate (CARAFATE) 1 GM/10ML suspension Take 1 g by mouth 4 (four) times  daily as needed (For sore throat.).   Yes Historical Provider, MD  zolpidem (AMBIEN) 10 MG tablet Take 0.5 tablets (5 mg total) by mouth at bedtime. 12/24/14  Yes Heath Lark, MD    Physical Exam:   Filed Vitals:   12/30/14 1333 12/30/14 1357 12/30/14 1442 12/30/14 1534  BP: 130/69  127/75 135/66  Pulse: 92  94 93  Temp:  101 F (38.3 C)    TempSrc:  Rectal    Resp: 25  29 25   SpO2: 96%  97% 99%     Physical Exam: Blood pressure 135/66, pulse 93, temperature 101 F (38.3 C), temperature source Rectal, resp. rate 25, SpO2 99 %. Gen: No acute distress. Head: Normocephalic, atraumatic. Eyes: PERRL, EOMI, sclerae nonicteric. Mouth: Has thrush of the tongue and oropharynx Neck: Supple, no thyromegaly, no lymphadenopathy, no jugular venous distention. Chest: Good air movement with crackles right middle and lower lung no wheezing appreciated CV: Has regular rate and rhythm with positive S1-S2 no murmurs rubs gallops appreciated Abdomen: Soft, nontender, nondistended with normal active bowel sounds. Extremities: Extremities Skin: Warm and dry. Neuro: Alert and oriented times 2; cranial nerves II through XII grossly intact. Psych: Mood and affect normal.   Data Review:    Labs: Basic Metabolic Panel:  Recent Labs Lab 12/30/14 1237  NA 132*  K  4.4  CL 98*  CO2 25  GLUCOSE 114*  BUN 21*  CREATININE 0.91  CALCIUM 8.5*   Liver Function Tests:  Recent Labs Lab 12/30/14 1237  AST 19  ALT 15*  ALKPHOS 51  BILITOT 0.6  PROT 6.6  ALBUMIN 2.8*   No results for input(s): LIPASE, AMYLASE in the last 168 hours. No results for input(s): AMMONIA in the last 168 hours. CBC:  Recent Labs Lab 12/30/14 1237  WBC 16.8*  NEUTROABS 15.1*  HGB 10.9*  HCT 34.7*  MCV 79.4  PLT 357   Cardiac Enzymes: No results for input(s): CKTOTAL, CKMB, CKMBINDEX, TROPONINI in the last 168 hours.  BNP (last 3 results) No results for input(s): PROBNP in the last 8760 hours. CBG: No results for input(s): GLUCAP in the last 168 hours.  Radiographic Studies: Dg Chest 2 View  12/30/2014  CLINICAL DATA:  Mental status changes in weakness over the last 2 days. Fell yesterday. EXAM: CHEST  2 VIEW COMPARISON:  None. FINDINGS: Heart size is normal. There is atherosclerosis of the aorta. Power port in place from a right internal jugular approach has its tip in the SVC above the right atrium. The vascularity is normal. The lungs are clear. No effusions. No acute bone findings. IMPRESSION: No active cardiopulmonary disease. Electronically Signed   By: Nelson Chimes M.D.   On: 12/30/2014 13:34   Ct Head Wo Contrast  12/30/2014  CLINICAL DATA:  Cancer of the tongue post chemotherapy most recently on 12/25/2014 has not been acting normal since [facility] visit on 12/28/2014, fell on Sunday uncertain if he struck his head, worsened speech, hypertension EXAM: CT HEAD WITHOUT CONTRAST TECHNIQUE: Contiguous axial images were obtained from the base of the skull through the vertex without intravenous contrast. COMPARISON:  09/30/2014 FINDINGS: Generalized atrophy. Normal ventricular morphology. No midline shift or mass effect. Small vessel chronic ischemic changes of deep cerebral white matter. No intracranial hemorrhage, mass lesion, or acute infarction.  Visualized paranasal sinuses and mastoid air cells clear. Bones unremarkable. Atherosclerotic calcification of internal carotid and vertebral arteries at skullbase. IMPRESSION: Atrophy with small vessel chronic ischemic changes of deep cerebral  white matter. No acute intracranial abnormalities. Electronically Signed   By: Lavonia Dana M.D.   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 Subsequent encounter. EXAM: CT NECK WITH CONTRAST TECHNIQUE: Multidetector CT imaging of the neck was performed using the standard protocol following the bolus administration of intravenous contrast. CONTRAST:  81mL OMNIPAQUE IOHEXOL 300 MG/ML  SOLN COMPARISON:  Outside [facility] neck CT 10/04/2014 available on [facility] PACS FINDINGS: Pharynx and larynx: Regressed but not resolved mass like hyper enhancement at the left tongue base and glossal tonsillar sulcus. Residual masslike hyper enhancement is poorly marginated. There is generalized pharyngeal mucosal space edema including involvement of the epiglottis. Negative parapharyngeal spaces. Retropharyngeal course of both carotid arteries. Trace retropharyngeal effusion. Salivary glands: Abnormal left sublingual space which appears primarily due to extensive oral tongue and sublingual extension of the primary tumor. This is better demonstrated on 10/04/2014. Stable submandibular gland ; mild postradiation changes. Negative parotid glands. Thyroid: Negative. Lymph nodes: Malignant left level 2/level 3 lymphadenopathy is stable to perhaps slightly increased in size since August, with partially cystic/ necrotic nodes measuring 20-23 mm diameter individually today versus 20 mm previously. Right side cervical lymph nodes appear stable, the largest at  right level IIa a measuring 8 mm short axis. Vascular: Increased effacement of the left internal jugular vein since August related to the malignant nodes, but this does remain patent. Retropharyngeal course of both common and internal carotid arteries. Major vascular structures in the neck and at the skullbase remain patent. Carotid calcified atherosclerosis. Right IJ approach porta cath partially visible. Limited intracranial: Negative. Visualized orbits: Not included. Mastoids and visualized paranasal sinuses: Minimal left maxillary alveolar recess mucosal thickening, otherwise Visualized paranasal sinuses and mastoids are clear. Skeleton: Some interval tooth extractions. Degenerative changes in the cervical spine. Degenerative cervical spinal stenosis. No suspicious osseous lesion identified in the neck. Upper chest: No superior mediastinal lymphadenopathy. Negative lung apices. IMPRESSION: 1. Some regression of the large left tongue tumor with involvement of the sublingual space. Tumor margins now indistinct. 2. Stable to slightly increased malignant left level 2 and level 3 nodes since August. Increased effacement of the left internal jugular vein which remains patent. 3. Otherwise expected postradiation changes in the neck soft tissues. 4. Advanced cervical spine degeneration with degenerative spinal stenosis. Electronically Signed   By: Genevie Ann M.D.   On: 12/30/2014 15:47   *I have personally reviewed the images above*  EKG: Independently reviewed. none   Assessment/Plan:   Sepsis (HCC)/ Fever in adult - I agree with starting him on a sepsis pathway as he has fever, white blood cell count and tachypnea. - Start empiric antibiotic for community-acquired pneumonia, as he has crackles in the right middle and lower lobe. His chest x-ray was negative. But he is hypochloremic and hyponatremic which could be pointing to mild dehydration. - He denies any dizziness upon standing will check  orthostatics. - Blood cultures have a ready been done by the ED, his Port-A-Cath is not tender erythematous to palpation. - Tylenol for fevers.  Oral thrush/  Dysphagia - Start him on lidocaine viscous gel for his mouth pain and he has oral thrush on physical exam start him on Diflucan. - His dysphagia is most likely due to his oral thrush. - Use  ketorolac for pain  Acute encephalopathy Most likely due to infectious etiology but also it could be due to narcotics. I will try to avoid narcotics, and will treat his primary infection.  Cancer of base of tongue ([place]) - We'll need to follow-up with the cancer Center as an outpatient.  Protein calorie malnutrition ([place]) - Consult nutrition for tube feedings and assessing nutritional caloric intake.  Microcytic anemia: Only to be started on ferrous sulfate as an outpatient, likely from intermittent bleeding from the tongue, leading to iron deficiency anemia. Will need anemia panel is an outpatient.   DVT prophylaxis  Code Status: Full. Family Communication: daughter Disposition Plan: Home when stable.  Time spent: 65 min  Charlynne Cousins [facility] Pager [phone]  If 7PM-7AM, please contact night-coverage [email or web address] Password [email or web address] 12/30/2014, 4:54 PM

## 2014-12-30 NOTE — Progress Notes (Signed)
ANTIBIOTIC CONSULT NOTE - INITIAL  Pharmacy Consult for vancomycin,  Indication: bacteremia  Allergies  Allergen Reactions  . Dilaudid [Hydromorphone Hcl] Other (See Comments)    Hallucinations  . Other Other (See Comments)    Seasonal allergies - sneezing, runny nose, watery eyes    Patient Measurements:    Vital Signs: Temp: 101 F (38.3 C) (10/31 1357) Temp Source: Rectal (10/31 1357) BP: 130/69 mmHg (10/31 1333) Pulse Rate: 92 (10/31 1333) Intake/Output from previous day:   Intake/Output from this shift:    Labs:  Recent Labs  12/30/14 1237  WBC 16.8*  HGB 10.9*  PLT 357  CREATININE 0.91   Estimated Creatinine Clearance: 68 mL/min (by C-G formula based on Cr of 0.91). No results for input(s): VANCOTROUGH, VANCOPEAK, VANCORANDOM, GENTTROUGH, GENTPEAK, GENTRANDOM, TOBRATROUGH, TOBRAPEAK, TOBRARND, AMIKACINPEAK, AMIKACINTROU, AMIKACIN in the last 72 hours.   Microbiology: No results found for this or any previous visit (from the past 720 hour(s)).  Medical History: Past Medical History  Diagnosis Date  . 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 Hypertension   . Retinitis pigmentosa of both eyes   . Anxiety   . Depression   . GERD (gastroesophageal reflux disease)     hx of   . Arthritis     shoulders, right knee   . Insomnia 12/24/2014    Medications:  Scheduled:  . acetaminophen  650 mg Oral Once   Infusions:  . piperacillin-tazobactam (ZOSYN)  IV     Assessment: 79 yo presented to ER with AMS that started 2 days ago at the [facility]. Patient noted to have fell on 10/30 with small cut on left eyebrow. To start vancomycin and Zosyn for possible bacteremia per Md orders. Tmax of 101, WBC elevated and SCr 0.91 with CrCl 68 ml/min  Goal of Therapy:  Vancomycin trough level 15-20 mcg/ml  Plan:  1) Vancomycin 1g IV q12 for current weight and renal function 2) Zosyn 3.375g IV q8 (extended interval infusion) for CrCl > 20 ml/min   Adrian Saran, PharmD, BCPS Pager [phone] 12/30/2014 2:10 PM

## 2014-12-30 NOTE — ED Provider Notes (Signed)
CSN: [id number]     Arrival date & time 12/30/14  1115 History   First MD Initiated Contact with Patient 12/30/14 1237     Chief Complaint  Patient presents with  . Ca pt, AMS      (Consider location/radiation/quality/duration/timing/severity/associated sxs/prior Treatment) HPI  79 year old male brought in by family for altered mental status. Started 2 days ago shortly after getting IV Dilaudid and IV fluids at the cancer center. He was having these for pain as well as dehydration. Since then he has been having confusion and intermittent agitation and not acting right. He has not had any more of his narcotics since. He did take a full Ambien instead of a half Ambien last night for sleep in his artery acting odd and abnormal before that. No fevers at home. Has been having a cough for a while and occasionally seems to be short of breath although is not right now according to family. Denies urinary symptoms. He did fall and hit his head yesterday and has a small abrasion to his left eyebrow. He does not remember the fall and no one witnessed the fall.  Past Medical History  Diagnosis Date  . 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 Hypertension   . Retinitis pigmentosa of both eyes   . Anxiety   . Depression   . GERD (gastroesophageal reflux disease)     hx of   . Arthritis     shoulders, right knee   . Insomnia 12/24/2014   Past Surgical History  Procedure Laterality Date  . Biopsy tongue  10/24/14  . Knee surgery Right   . Tonsilectomy, adenoidectomy, bilateral myringotomy and tubes    . Feedign tube srugery     . Multiple extractions with alveoloplasty N/A 11/20/2014    Procedure: Extraction of tooth #'s 1,15,16,17,20,31 with alveoloplasty and gross debridement of remaining teeth;  Surgeon: Lenn Cal, DDS;  Location: [facility];  Service: Oral Surgery;  Laterality: N/A;   Family History  Problem Relation Age of Onset  . Ovarian cancer Sister   . Heart attack Father    . Breast cancer      neice on Sister's side.  Also had tongue cancer.   Social History  Substance Use Topics  . Smoking status: Never Smoker   . Smokeless tobacco: Never Used  . Alcohol Use: No     Comment: hx of occ drink     Review of Systems  Constitutional: Positive for fatigue. Negative for fever.  Respiratory: Positive for cough. Negative for shortness of breath.   Cardiovascular: Negative for chest pain.  Gastrointestinal: Negative for vomiting and abdominal pain.  Genitourinary: Negative for dysuria.  Neurological: Positive for speech difficulty.  Psychiatric/Behavioral: Positive for confusion.  All other systems reviewed and are negative.     Allergies  Dilaudid and Other  Home Medications   Prior to Admission medications   Medication Sig Start Date End Date Taking? Authorizing Provider  acetaminophen (TYLENOL) 325 MG tablet Take 650 mg by mouth every 6 (six) hours as needed (For pain.).   Yes Historical Provider, MD  clindamycin (CLINDAGEL) 1 % gel Apply topically 2 (two) times daily. 12/17/14  Yes Heath Lark, MD  fluticasone (FLONASE) 50 MCG/ACT nasal spray Place 1 spray into the nose every morning.    Yes Historical Provider, MD  HYDROcodone-acetaminophen (HYCET) 7.5-325 mg/15 ml solution Take 15 mLs by mouth every 6 (six) hours as needed for moderate pain. 11/20/14  11/20/15 Yes Lenn Cal, DDS  hydrocortisone 1 % ointment Apply 1 application topically 2 (two) times daily. 1 large tube 12/10/14  Yes Ni Gorsuch, MD  lidocaine (XYLOCAINE) 2 % solution Mix 1 part 2%viscous lidocaine,1part H2O.Swish and/or swallow 68mL of this mixture,18min before meals and at bedtime, up to QID 12/09/14  Yes Eppie Gibson, MD  lidocaine-prilocaine (EMLA) cream Apply to affected area once Patient taking differently: Apply 1 application topically daily as needed (For port-a-cath.).  12/02/14  Yes Heath Lark, MD  loratadine (CLARITIN) 10 MG tablet Take 10 mg by mouth daily as needed  for allergies.   Yes Historical Provider, MD  Menthol, Topical Analgesic, 16 % LIQD Apply 1 application topically daily. Applies for shoulder pain.   Yes Historical Provider, MD  metoCLOPramide (REGLAN) 10 MG tablet Take 1 tablet (10 mg total) by mouth 4 (four) times daily -  before meals and at bedtime. 12/18/14  Yes Heath Lark, MD  morphine (ROXANOL) 20 MG/ML concentrated solution Place 0.5 mLs (10 mg total) into feeding tube every 2 (two) hours as needed for severe pain. 12/17/14  Yes Heath Lark, MD  omeprazole (PRILOSEC) 20 MG capsule Take 20 mg by mouth at bedtime as needed (For heartburn or acid reflux.).    Yes Historical Provider, MD  ondansetron (ZOFRAN) 8 MG tablet Take 1 tablet (8 mg total) by mouth every 8 (eight) hours as needed for nausea. 12/05/14  Yes Heath Lark, MD  PRESCRIPTION MEDICATION Patient receives his chemo treatments at [facility] with Dr. Alvy Bimler. He is receiving Cetuximab every 7 days. His last dose was on 12/25/14.   Yes Historical Provider, MD  prochlorperazine (COMPAZINE) 10 MG tablet Take 1 tablet (10 mg total) by mouth every 6 (six) hours as needed. Patient taking differently: Take 10 mg by mouth every 6 (six) hours as needed for nausea or vomiting.  12/05/14  Yes Heath Lark, MD  sodium fluoride (FLUORISHIELD) 1.1 % GEL dental gel Instill one drop of gel per tooth space of fluoride tray. Place over teeth for 5 minutes. Remove. Spit out excess. Repeat nightly 11/27/14  Yes Lenn Cal, DDS  sucralfate (CARAFATE) 1 GM/10ML suspension Take 1 g by mouth 4 (four) times daily as needed (For sore throat.).   Yes Historical Provider, MD  zolpidem (AMBIEN) 10 MG tablet Take 0.5 tablets (5 mg total) by mouth at bedtime. 12/24/14  Yes Ni Gorsuch, MD   BP 129/72 mmHg  Pulse 98  Temp(Src) 99.4 F (37.4 C) (Oral)  Resp 26  SpO2 93% Physical Exam  Constitutional: He is oriented to person, place, and time. He appears well-developed and  well-nourished.  HENT:  Head: Normocephalic and atraumatic.  Right Ear: External ear normal.  Left Ear: External ear normal.  Nose: Nose normal.  Mouth/Throat: Uvula is midline. Mucous membranes are dry.  Diffusely enlarged tongue, mild erythema. No posterior oropharyngeal swelling.   Eyes: EOM are normal. Pupils are equal, round, and reactive to light. Right eye exhibits no discharge. Left eye exhibits no discharge.  Neck: Normal range of motion and full passive range of motion without pain. Neck supple.  No focal neck tenderness or swelling  Cardiovascular: Normal rate, regular rhythm, normal heart sounds and intact distal pulses.   Pulmonary/Chest: Effort normal and breath sounds normal.  Port in place in right chest without tenderness or redness  Abdominal: Soft. There is no tenderness.  Musculoskeletal: He exhibits no edema.  Neurological: He is alert  and oriented to person, place, and time.  Alert, oriented x 3, seems sleepy but appropriate when awoken. Moves all 4 extremities with equal strength.   Skin: Skin is warm and dry.  Nursing note and vitals reviewed.   ED Course  Procedures (including critical care time) Labs Review Labs Reviewed  COMPREHENSIVE METABOLIC PANEL - Abnormal; Notable for the following:    Sodium 132 (*)    Chloride 98 (*)    Glucose, Bld 114 (*)    BUN 21 (*)    Calcium 8.5 (*)    Albumin 2.8 (*)    ALT 15 (*)    All other components within normal limits  URINALYSIS, ROUTINE W REFLEX MICROSCOPIC (NOT AT [facility]) - Abnormal; Notable for the following:    Protein, ur 30 (*)    All other components within normal limits  CBC WITH DIFFERENTIAL/PLATELET - Abnormal; Notable for the following:    WBC 16.8 (*)    Hemoglobin 10.9 (*)    HCT 34.7 (*)    MCH 24.9 (*)    Neutro Abs 15.1 (*)    All other components within normal limits  CULTURE, BLOOD (ROUTINE X 2)  CULTURE, BLOOD (ROUTINE X 2)  URINE CULTURE  URINE MICROSCOPIC-ADD ON  I-STAT CG4 LACTIC  ACID, ED  I-STAT CG4 LACTIC ACID, ED    Imaging Review Dg Chest 2 View  12/30/2014  CLINICAL DATA:  Mental status changes in weakness over the last 2 days. Fell yesterday. EXAM: CHEST  2 VIEW COMPARISON:  None. FINDINGS: Heart size is normal. There is atherosclerosis of the aorta. Power port in place from a right internal jugular approach has its tip in the SVC above the right atrium. The vascularity is normal. The lungs are clear. No effusions. No acute bone findings. IMPRESSION: No active cardiopulmonary disease. Electronically Signed   By: Nelson Chimes M.D.   On: 12/30/2014 13:34   Ct Head Wo Contrast  12/30/2014  CLINICAL DATA:  Cancer of the tongue post chemotherapy most recently on 12/25/2014 has not been acting normal since [place] visit on 12/28/2014, fell on Sunday uncertain if he struck his head, worsened speech, hypertension EXAM: CT HEAD WITHOUT CONTRAST TECHNIQUE: Contiguous axial images were obtained from the base of the skull through the vertex without intravenous contrast. COMPARISON:  09/30/2014 FINDINGS: Generalized atrophy. Normal ventricular morphology. No midline shift or mass effect. Small vessel chronic ischemic changes of deep cerebral white matter. No intracranial hemorrhage, mass lesion, or acute infarction. Visualized paranasal sinuses and mastoid air cells clear. Bones unremarkable. Atherosclerotic calcification of internal carotid and vertebral arteries at skullbase. IMPRESSION: Atrophy with small vessel chronic ischemic changes of deep cerebral white matter. No acute intracranial abnormalities. Electronically Signed   By: Lavonia Dana M.D.   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 Subsequent encounter. EXAM: CT NECK WITH CONTRAST  TECHNIQUE: Multidetector CT imaging of the neck was performed using the standard protocol following the bolus administration of intravenous contrast. CONTRAST:  79mL OMNIPAQUE IOHEXOL 300 MG/ML  SOLN COMPARISON:  Outside [facility] neck CT 10/04/2014 available on [facility] PACS FINDINGS: Pharynx and larynx: Regressed but not resolved mass like hyper enhancement at the left tongue base and glossal tonsillar sulcus. Residual masslike hyper enhancement is poorly marginated. There  is generalized pharyngeal mucosal space edema including involvement of the epiglottis. Negative parapharyngeal spaces. Retropharyngeal course of both carotid arteries. Trace retropharyngeal effusion. Salivary glands: Abnormal left sublingual space which appears primarily due to extensive oral tongue and sublingual extension of the primary tumor. This is better demonstrated on 10/04/2014. Stable submandibular gland ; mild postradiation changes. Negative parotid glands. Thyroid: Negative. Lymph nodes: Malignant left level 2/level 3 lymphadenopathy is stable to perhaps slightly increased in size since August, with partially cystic/ necrotic nodes measuring 20-23 mm diameter individually today versus 20 mm previously. Right side cervical lymph nodes appear stable, the largest at right level IIa a measuring 8 mm short axis. Vascular: Increased effacement of the left internal jugular vein since August related to the malignant nodes, but this does remain patent. Retropharyngeal course of both common and internal carotid arteries. Major vascular structures in the neck and at the skullbase remain patent. Carotid calcified atherosclerosis. Right IJ approach porta cath partially visible. Limited intracranial: Negative. Visualized orbits: Not included. Mastoids and visualized paranasal sinuses: Minimal left maxillary alveolar recess mucosal thickening, otherwise Visualized paranasal sinuses and mastoids are clear. Skeleton: Some interval tooth  extractions. Degenerative changes in the cervical spine. Degenerative cervical spinal stenosis. No suspicious osseous lesion identified in the neck. Upper chest: No superior mediastinal lymphadenopathy. Negative lung apices. IMPRESSION: 1. Some regression of the large left tongue tumor with involvement of the sublingual space. Tumor margins now indistinct. 2. Stable to slightly increased malignant left level 2 and level 3 nodes since August. Increased effacement of the left internal jugular vein which remains patent. 3. Otherwise expected postradiation changes in the neck soft tissues. 4. Advanced cervical spine degeneration with degenerative spinal stenosis. Electronically Signed   By: Genevie Ann M.D.   On: 12/30/2014 15:47   I have personally reviewed and evaluated these images and lab results as part of my medical decision-making.   EKG Interpretation None      MDM   Final diagnoses:  Fever in adult    Unclear where the fever is coming from. The delirium is likely related to an acute infection which could be bacteremia from his port. I doubt medication as the IV Dilaudid would be well out of the system. His presentation is not consistent with meningitis as he is awake and alert but mildly sleepy. His neck exam shows no stiffness with full range of motion. Highly doubt CNS infection. Given patient's complaint of sore throat and tongue pain (which is not new) a CT was neck was obtained. No acute infection or new process. This could be related to a stomatitis or esophagitis. Given broad antibiotics when fever identified, will need admission for monitoring and further care and evaluation.    Sherwood Gambler, MD 12/30/14 [phone]

## 2014-12-30 NOTE — ED Notes (Signed)
Bed: NO03 Expected date:  Expected time:  Means of arrival:  Comments: Triage [place]

## 2014-12-30 NOTE — Telephone Encounter (Signed)
Gorsuch patient 

## 2014-12-30 NOTE — Telephone Encounter (Signed)
Returning daughter's call. Daughter stated he got a pain medication on Saturday that pretty much knocked him out for the weekend. He fell during the night and the skin on his hand was torn. She found him on his hands and knees. There was some blood on his forehead but she did not see any cut on his head, not sure if he hit his head or smeared blood from his hand. He is mouth breathing somewhat labored. He c/o dry mouth. He is currently sleeping and a home health nurse is supposed to see him this AM. Told daughter to take him to ER at [facility]. She requested that [facility] see him first. I instructed her to call [facility] and if [facility] cannot get there as her first call of the AM to take him to the ER.

## 2014-12-30 NOTE — ED Notes (Signed)
Per family, pt currently has cancer of the tongue, last chemo on 10/26. Pt went to cancer center on Saturday 10/29, was given fluids and dilaudid, has not been acting normal since then. Pt went home Saturday and slept. Pt fell on Sunday, unsure if pt hit his head. Small possible cut to left eyebrow. Family reports speech has gotten worse since this weekend.   Pt took a whole ambien last night, he is only suppose to take 1/2 tablet.

## 2014-12-30 NOTE — Telephone Encounter (Signed)
Pt is currently in ED.

## 2014-12-30 NOTE — Telephone Encounter (Signed)
Returned call to dtr Kenneth Jordan re cx appointments due to patient in hosp. Per dtr cxd appointments for today and tomorrow and she is patient is discharged and can keep 11/2. Left message for desk nurse.

## 2014-12-31 ENCOUNTER — Encounter: Payer: Self-pay | Admitting: *Deleted

## 2014-12-31 ENCOUNTER — Ambulatory Visit
Admission: RE | Admit: 2014-12-31 | Discharge: 2014-12-31 | Disposition: A | Discharge status: Home / Self Care | Payer: Non-veteran care | Source: Ambulatory Visit | Attending: Radiation Oncology | Admitting: Radiation Oncology

## 2014-12-31 ENCOUNTER — Ambulatory Visit: Payer: Self-pay

## 2014-12-31 ENCOUNTER — Other Ambulatory Visit: Payer: Self-pay

## 2014-12-31 DIAGNOSIS — E46 Unspecified protein-calorie malnutrition: Secondary | ICD-10-CM

## 2014-12-31 DIAGNOSIS — D6481 Anemia due to antineoplastic chemotherapy: Secondary | ICD-10-CM

## 2014-12-31 DIAGNOSIS — A419 Sepsis, unspecified organism: Principal | ICD-10-CM

## 2014-12-31 DIAGNOSIS — K1231 Oral mucositis (ulcerative) due to antineoplastic therapy: Secondary | ICD-10-CM

## 2014-12-31 DIAGNOSIS — C01 Malignant neoplasm of base of tongue: Secondary | ICD-10-CM

## 2014-12-31 DIAGNOSIS — R5081 Fever presenting with conditions classified elsewhere: Secondary | ICD-10-CM

## 2014-12-31 DIAGNOSIS — R21 Rash and other nonspecific skin eruption: Secondary | ICD-10-CM

## 2014-12-31 LAB — GLUCOSE, CAPILLARY
GLUCOSE-CAPILLARY: 96 mg/dL (ref 65–99)
GLUCOSE-CAPILLARY: 79 mg/dL (ref 65–99)

## 2014-12-31 LAB — COMPREHENSIVE METABOLIC PANEL
ALBUMIN: 2.2 g/dL — AB (ref 3.5–5.0)
ALK PHOS: 39 U/L (ref 38–126)
ALT: 12 U/L — AB (ref 17–63)
ANION GAP: 6 (ref 5–15)
AST: 14 U/L — ABNORMAL LOW (ref 15–41)
BUN: 16 mg/dL (ref 6–20)
CALCIUM: 8 mg/dL — AB (ref 8.9–10.3)
CHLORIDE: 108 mmol/L (ref 101–111)
CO2: 23 mmol/L (ref 22–32)
Creatinine, Ser: 0.74 mg/dL (ref 0.61–1.24)
GFR calc Af Amer: >60 mL/min (ref >60)
GFR calc non Af Amer: >60 mL/min (ref >60)
GLUCOSE: 88 mg/dL (ref 65–99)
Potassium: 3.7 mmol/L (ref 3.5–5.1)
SODIUM: 137 mmol/L (ref 135–145)
Total Bilirubin: 0.9 mg/dL (ref 0.3–1.2)
Total Protein: 5.4 g/dL — ABNORMAL LOW (ref 6.5–8.1)

## 2014-12-31 LAB — CBC
HCT: 29.2 % — ABNORMAL LOW (ref 39.0–52.0)
HEMOGLOBIN: 9.1 g/dL — AB (ref 13.0–17.0)
MCH: 25 pg — ABNORMAL LOW (ref 26.0–34.0)
MCHC: 31.2 g/dL (ref 30.0–36.0)
MCV: 80.2 fL (ref 78.0–100.0)
PLATELETS: 319 10*3/uL (ref 150–400)
RBC: 3.64 MIL/uL — AB (ref 4.22–5.81)
RDW: 14.9 % (ref 11.5–15.5)
WBC: 10.2 10*3/uL (ref 4.0–10.5)

## 2014-12-31 LAB — HIV ANTIBODY (ROUTINE TESTING W REFLEX): HIV SCREEN 4TH GENERATION: NONREACTIVE

## 2014-12-31 LAB — STREP PNEUMONIAE URINARY ANTIGEN: STREP PNEUMO URINARY ANTIGEN: NEGATIVE

## 2014-12-31 LAB — LACTIC ACID, PLASMA: LACTIC ACID, VENOUS: 0.6 mmol/L (ref 0.5–2.0)

## 2014-12-31 MED ORDER — FERROUS SULFATE 300 (60 FE) MG/5ML PO SYRP
300.0000 mg | ORAL_SOLUTION | Freq: Three times a day (TID) | ORAL | Status: DC
  Administered 2014-12-31 – 2015-01-03 (×11): 300 mg via ORAL
  Filled 2014-12-31 (×13): qty 5

## 2014-12-31 MED ORDER — OSMOLITE 1.5 CAL PO LIQD
237.0000 mL | Freq: Every day | ORAL | Status: DC
  Administered 2014-12-31 – 2015-01-03 (×20): 237 mL
  Filled 2014-12-31 (×24): qty 237

## 2014-12-31 MED ORDER — VITAMINS A & D EX OINT
TOPICAL_OINTMENT | CUTANEOUS | Status: AC
  Administered 2014-12-31: 5
  Filled 2014-12-31: qty 5

## 2014-12-31 MED ORDER — FERROUS SULFATE 75 (15 FE) MG/ML PO SOLN: 15.0000 mg | Freq: Three times a day (TID) | ORAL | Status: DC

## 2014-12-31 MED ORDER — DEXTROSE 5 % IV SOLN
500.0000 mg | INTRAVENOUS | Status: DC
  Administered 2014-12-31 – 2015-01-03 (×4): 500 mg via INTRAVENOUS
  Filled 2014-12-31 (×4): qty 500

## 2014-12-31 MED ORDER — JEVITY 1.2 CAL PO LIQD: 1000.0000 mL | ORAL | Status: DC

## 2014-12-31 MED ORDER — CEFTRIAXONE SODIUM 1 G IJ SOLR
1.0000 g | INTRAMUSCULAR | Status: DC
  Administered 2014-12-31 – 2015-01-03 (×4): 1 g via INTRAVENOUS
  Filled 2014-12-31 (×4): qty 10

## 2014-12-31 MED ORDER — PRO-STAT SUGAR FREE PO LIQD
30.0000 mL | Freq: Two times a day (BID) | ORAL | Status: DC
  Administered 2014-12-31 – 2015-01-03 (×6): 30 mL
  Filled 2014-12-31 (×6): qty 30

## 2014-12-31 MED ORDER — SODIUM CHLORIDE 0.9 % IJ SOLN: 10.0000 mL | Freq: Two times a day (BID) | INTRAMUSCULAR | Status: DC

## 2014-12-31 MED ORDER — FREE WATER
100.0000 mL | Status: DC
  Administered 2014-12-31 – 2015-01-03 (×18): 100 mL

## 2014-12-31 MED ORDER — POLYETHYLENE GLYCOL 3350 17 G PO PACK: 17.0000 g | PACK | Freq: Two times a day (BID) | ORAL | Status: AC

## 2014-12-31 MED ORDER — SODIUM CHLORIDE 0.9 % IJ SOLN
10.0000 mL | INTRAMUSCULAR | Status: DC | PRN
  Administered 2015-01-03: 10 mL
  Filled 2014-12-31: qty 40

## 2014-12-31 NOTE — Progress Notes (Signed)
Initial Nutrition Assessment  DOCUMENTATION CODES:   Not applicable  INTERVENTION:  Osmolite 1.5 - 6 cans 60 ml pro stat 100 ml flush QID  2 Can bolus TID  Provides:  2330 calories 120g Pro 1086cc free H2O   NUTRITION DIAGNOSIS:   Inadequate oral intake related to cancer and cancer related treatments as evidenced by other (see comment), NPO status (PEG, Tubefeed Consult).   GOAL:   Patient will meet greater than or equal to 90% of their needs   MONITOR:   Labs, I & O's, Skin  REASON FOR ASSESSMENT:   Consult Enteral/tube feeding initiation and management  ASSESSMENT:   Kenneth Jordan is an 79 y.o. male past medical history of squamous cancer of the base of the tongue on chemotherapy cetuximab and radiation therapy, the PEG tube for feeding that went to [facility] and was found to be hypotensive and with tongue pain, was given IV pain medications and IV fluid and discharge home since then he has been confused. Most of the history is obtained from the daughter relates that he has been confused since then progressively getting worse. He felt at the house the day prior to admission and cut himself above the left eyebrow. She denies that he has been complaining of any shortness of breath he's had some cough over the last 4 days, some fever at home. He is not wanted to eat, he obtains most of his feeding through his feeding tube.  Visited pt, slightly confused but able to answer some questions. Reported no nausea/vomitting. Does not appear malnourished via NFPA.   Per chart review, weight appears stable over this month. Pt is on multiple antibiotics to treat sepsis.  Pending swallow eval.  Electrolytes appear WNL Reviewed medications  Follow for tubefeed tolerance.  Diet Order:  Diet NPO time specified  Skin:  Reviewed, no issues  Last BM:  12/30/2014  Height:   Ht Readings from Last 1 Encounters:  12/30/14 5\' 9"  (1.753 m)     Weight:   Wt Readings from Last 1 Encounters:  12/30/14 166 lb 3.6 oz (75.4 kg)    Ideal Body Weight:     BMI:  Body mass index is 24.54 kg/(m^2).  Estimated Nutritional Needs:   Kcal:  2200 - 2400 calories  Protein:  110 - 125g pro  Fluid:  2.2 - 2.4L  EDUCATION NEEDS:   No education needs identified at this time  Satira Anis. Latera Mclin, MS, RD LDN After Hours/Weekend Pager [phone]

## 2014-12-31 NOTE — Progress Notes (Signed)
[facility] PROGRESS NOTE    Progress Note   SHALIN VONBARGEN XKG:[id number] DOB: [birth year] DOA: 12/30/2014 PCP: Pcp Not In System   Brief Narrative:   Kenneth Jordan is an 79 y.o. 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  Assessment/Plan:  Sepsis (HCC)/ Fever in adult: D/C vancomycin and Zosyn. Started empirically on Rocephin and azithromycin, his chest x-ray did not show any infiltrate but he had crackles in the right middle and lower lung. He defervesced leukocytosis improved. Swallowing evaluation is pending. Cont to be NPO.  Dysphagia due to Oral thrush Continue Diflucan.  Cancer of base of tongue [facility]): - Continue radiation therapy, oncology has been informed. - oncology to follow up.  Protein calorie malnutrition [facility]): - Nutrition consult was pending.  Acute encephalopathy: - Now improved, likely infectious etiology and/or dehydration.  Microcytic anemia: Start ferrous sulfate 3 times a day.    DVT Prophylaxis - Lovenox ordered.  Family Communication: daughter Disposition Plan: Home in 3-4 days Code Status:     Code Status Orders        Start     Ordered   12/30/14 2005  Full code   Continuous     12/30/14 2004        IV Access:    Peripheral IV   Procedures and diagnostic studies:   Dg Chest 2 View  12/30/2014  CLINICAL DATA:  Mental status changes in weakness over the last 2 days. Fell yesterday. EXAM: CHEST  2 VIEW COMPARISON:  None. FINDINGS: Heart size is normal. There is atherosclerosis of the aorta. Power port in place from a right internal jugular approach has its tip in the SVC above the right atrium. The vascularity is normal. The lungs are clear. No effusions. No acute bone findings.  IMPRESSION: No active cardiopulmonary disease. Electronically Signed   By: Nelson Chimes M.D.   On: 12/30/2014 13:34   Ct Head Wo Contrast  12/30/2014  CLINICAL DATA:  Cancer of the tongue post chemotherapy most recently on 12/25/2014 has not been acting normal since [place] visit on 12/28/2014, fell on Sunday uncertain if he struck his head, worsened speech, hypertension EXAM: CT HEAD WITHOUT CONTRAST TECHNIQUE: Contiguous axial images were obtained from the base of the skull through the vertex without intravenous contrast. COMPARISON:  09/30/2014 FINDINGS: Generalized atrophy. Normal ventricular morphology. No midline shift or mass effect. Small vessel chronic ischemic changes of deep cerebral white matter. No intracranial hemorrhage, mass lesion, or acute infarction. Visualized paranasal sinuses and mastoid air cells clear. Bones unremarkable. Atherosclerotic calcification of internal carotid and vertebral arteries at skullbase. IMPRESSION: Atrophy with small vessel chronic ischemic changes of deep cerebral white matter. No acute intracranial abnormalities. Electronically Signed   By: Lavonia Dana M.D.   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 Subsequent encounter. EXAM: CT NECK WITH CONTRAST TECHNIQUE: Multidetector CT imaging of the neck was performed using the standard protocol following the bolus administration of intravenous contrast. CONTRAST:  64mL OMNIPAQUE IOHEXOL 300 MG/ML  SOLN COMPARISON:  [facility] [place]  [facility] neck CT 10/04/2014 available on [facility] PACS FINDINGS: Pharynx and larynx: Regressed but not resolved mass like hyper enhancement at the left tongue base and glossal tonsillar sulcus. Residual masslike hyper enhancement is poorly marginated.  There is generalized pharyngeal mucosal space edema including involvement of the epiglottis. Negative parapharyngeal spaces. Retropharyngeal course of both carotid arteries. Trace retropharyngeal effusion. Salivary glands: Abnormal left sublingual space which appears primarily due to extensive oral tongue and sublingual extension of the primary tumor. This is better demonstrated on 10/04/2014. Stable submandibular gland ; mild postradiation changes. Negative parotid glands. Thyroid: Negative. Lymph nodes: Malignant left level 2/level 3 lymphadenopathy is stable to perhaps slightly increased in size since August, with partially cystic/ necrotic nodes measuring 20-23 mm diameter individually today versus 20 mm previously. Right side cervical lymph nodes appear stable, the largest at right level IIa a measuring 8 mm short axis. Vascular: Increased effacement of the left internal jugular vein since August related to the malignant nodes, but this does remain patent. Retropharyngeal course of both common and internal carotid arteries. Major vascular structures in the neck and at the skullbase remain patent. Carotid calcified atherosclerosis. Right IJ approach porta cath partially visible. Limited intracranial: Negative. Visualized orbits: Not included. Mastoids and visualized paranasal sinuses: Minimal left maxillary alveolar recess mucosal thickening, otherwise Visualized paranasal sinuses and mastoids are clear. Skeleton: Some interval tooth extractions. Degenerative changes in the cervical spine. Degenerative cervical spinal stenosis. No suspicious osseous lesion identified in the neck. Upper chest: No superior mediastinal lymphadenopathy. Negative lung apices. IMPRESSION: 1. Some regression of the large left tongue tumor with involvement of the sublingual space. Tumor margins now indistinct. 2. Stable to slightly increased malignant left level 2 and level 3 nodes since August. Increased effacement of the left internal  jugular vein which remains patent. 3. Otherwise expected postradiation changes in the neck soft tissues. 4. Advanced cervical spine degeneration with degenerative spinal stenosis. Electronically Signed   By: Genevie Ann M.D.   On: 12/30/2014 15:47     Medical Consultants:    None.  Anti-Infectives:   Anti-infectives    Start     Dose/Rate Route Frequency Ordered Stop   12/31/14 2100  fluconazole (DIFLUCAN) IVPB 100 mg     100 mg 50 mL/hr over 60 Minutes Intravenous Every 24 hours 12/30/14 2004     12/31/14 0400  vancomycin (VANCOCIN) IVPB 1000 mg/200 mL premix     1,000 mg 200 mL/hr over 60 Minutes Intravenous Every 12 hours 12/30/14 1413     12/31/14 0000  piperacillin-tazobactam (ZOSYN) IVPB 3.375 g     3.375 g 12.5 mL/hr over 240 Minutes Intravenous Every 8 hours 12/30/14 1413     12/30/14 2100  fluconazole (DIFLUCAN) IVPB 200 mg     200 mg 100 mL/hr over 60 Minutes Intravenous  Once 12/30/14 2004 12/30/14 2309   12/30/14 2015  cefTRIAXone (ROCEPHIN) 1 g in dextrose 5 % 50 mL IVPB  Status:  Discontinued     1 g 100 mL/hr over 30 Minutes Intravenous Every 24 hours 12/30/14 2004 12/30/14 2019   12/30/14 2015  azithromycin (ZITHROMAX) tablet 500 mg  Status:  Discontinued     500 mg Oral Every 24 hours 12/30/14 2004 12/30/14 2019   12/30/14 1415  piperacillin-tazobactam (ZOSYN) IVPB 3.375 g  Status:  Discontinued     3.375 g 12.5 mL/hr over 240 Minutes Intravenous  Once 12/30/14 1403 12/30/14 1413   12/30/14 1415  piperacillin-tazobactam (ZOSYN) IVPB 3.375 g  3.375 g 100 mL/hr over 30 Minutes Intravenous  Once 12/30/14 1413 12/30/14 1519   12/30/14 1415  vancomycin (VANCOCIN) IVPB 1000 mg/200 mL premix     1,000 mg 200 mL/hr over 60 Minutes Intravenous  Once 12/30/14 1413 12/30/14 1835      Subjective:    [name] sleepy but hungry.  Objective:    Filed Vitals:   12/30/14 1534 12/30/14 1700 12/30/14 1839 12/31/14 0515  BP: 135/66 134/58 141/85 132/66  Pulse:  93 86 80 92  Temp:   98.4 F (36.9 C) 97.4 F (36.3 C)  TempSrc:   Oral Oral  Resp: 25 22  20   Height:   5\' 9"  (1.753 m)   Weight:   75.4 kg (166 lb 3.6 oz)   SpO2: 99% 94%  97%    Intake/Output Summary (Last 24 hours) at 12/31/14 0849 Last data filed at 12/31/14 0700  Gross per 24 hour  Intake    990 ml  Output      0 ml  Net    990 ml   Filed Weights   12/30/14 1839  Weight: 75.4 kg (166 lb 3.6 oz)    Exam: Gen:  NAD Cardiovascular:  RRR. Chest and lungs:   CTAB Abdomen:  Abdomen soft, NT/ND, + BS Extremities:  No C/E/C   Data Reviewed:    Labs: Basic Metabolic Panel:  Recent Labs Lab 12/30/14 1237 12/31/14 0500  NA 132* 137  K 4.4 3.7  CL 98* 108  CO2 25 23  GLUCOSE 114* 88  BUN 21* 16  CREATININE 0.91 0.74  CALCIUM 8.5* 8.0*   GFR Estimated Creatinine Clearance: 77.3 mL/min (by C-G formula based on Cr of 0.74). Liver Function Tests:  Recent Labs Lab 12/30/14 1237 12/31/14 0500  AST 19 14*  ALT 15* 12*  ALKPHOS 51 39  BILITOT 0.6 0.9  PROT 6.6 5.4*  ALBUMIN 2.8* 2.2*   No results for input(s): LIPASE, AMYLASE in the last 168 hours. No results for input(s): AMMONIA in the last 168 hours. Coagulation profile  Recent Labs Lab 12/30/14 2117  INR 1.11    CBC:  Recent Labs Lab 12/30/14 1237 12/31/14 0500  WBC 16.8* 10.2  NEUTROABS 15.1*  --   HGB 10.9* 9.1*  HCT 34.7* 29.2*  MCV 79.4 80.2  PLT 357 319   Cardiac Enzymes: No results for input(s): CKTOTAL, CKMB, CKMBINDEX, TROPONINI in the last 168 hours. BNP (last 3 results) No results for input(s): PROBNP in the last 8760 hours. CBG: No results for input(s): GLUCAP in the last 168 hours. D-Dimer: No results for input(s): DDIMER in the last 72 hours. Hgb A1c: No results for input(s): HGBA1C in the last 72 hours. Lipid Profile: No results for input(s): CHOL, HDL, LDLCALC, TRIG, CHOLHDL, LDLDIRECT in the last 72 hours. Thyroid function studies: No results for input(s): TSH,  T4TOTAL, T3FREE, THYROIDAB in the last 72 hours.  Invalid input(s): FREET3 Anemia work up: No results for input(s): VITAMINB12, FOLATE, FERRITIN, TIBC, IRON, RETICCTPCT in the last 72 hours. Sepsis Labs:  Recent Labs Lab 12/30/14 1237 12/30/14 1248 12/30/14 1451 12/30/14 2117 12/31/14 0016 12/31/14 0500  PROCALCITON  --   --   --  <0.10  --   --   WBC 16.8*  --   --   --   --  10.2  LATICACIDVEN  --  1.49 1.20 0.8 0.6  --    Microbiology No results found for this or any previous visit (from the  past 240 hour(s)).   Medications:   . fluconazole (DIFLUCAN) IV  100 mg Intravenous Q24H  . heparin  5,000 Units Subcutaneous 3 times per day  . piperacillin-tazobactam (ZOSYN)  IV  3.375 g Intravenous Q8H  . sodium chloride  10-40 mL Intracatheter Q12H  . vancomycin  1,000 mg Intravenous Q12H  . zolpidem  5 mg Oral QHS   Continuous Infusions:   Time spent: 25 min   LOS: 1 day   Charlynne Cousins  [facility] Pager [phone]  *Please refer to [email or web address], password [email or web address] to get updated schedule on who will round on this patient, as hospitalists switch teams weekly. If 7PM-7AM, please contact night-coverage at [email or web address], password [email or web address] for any overnight needs.  12/31/2014, 8:49 AM

## 2014-12-31 NOTE — Evaluation (Signed)
Physical Therapy Evaluation Patient Details Name: JAHON BART MRN: [id number] DOB: 03-22-1935 Today's Date: 12/31/2014   History of Present Illness  Kenneth Jordan is an 79 y.o. 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 Per daughter,  he has been confused, had a fall at home 10/30and cut himself above the left eyebrow.   Clinical Impression  Patient  Is unsteady without support of  At least 1 UE. No family present to discuss caregiver availability. Patient reports that he has visual deficits: states he can see medially and peripherally but has a  Circle gap in vision.  Pt admitted with above diagnosis. Pt currently with functional limitations due to the deficits listed below (see PT Problem List).  Pt will benefit from skilled PT to increase their independence and safety with mobility to allow discharge to the venue listed below.       Follow Up Recommendations Home health PT;SNF;Supervision/Assistance - 24 hour    Equipment Recommendations  Rolling walker with 5" wheels    Recommendations for Other Services OT consult     Precautions / Restrictions Precautions Precautions: Fall Precaution Comments: PEG      Mobility  Bed Mobility Overal bed mobility: Independent                Transfers Overall transfer level: Needs assistance Equipment used: 1 person hand held assist Transfers: Sit to/from Stand Sit to Stand: Min guard         General transfer comment: steady assist to stand  Ambulation/Gait Ambulation/Gait assistance: Min assist Ambulation Distance (Feet): 450 Feet Assistive device: 1 person hand held assist Gait Pattern/deviations: Drifts right/left     General Gait Details: steady assist for balance, required closer  guarding/support  without UE support.  Stairs            Wheelchair Mobility    Modified Rankin (Stroke Patients Only)       Balance Overall balance assessment: History of Falls;Needs assistance Sitting-balance support: No upper extremity supported Sitting balance-Leahy Scale: Good     Standing balance support: During functional activity;No upper extremity supported Standing balance-Leahy Scale: Poor                               Pertinent Vitals/Pain Pain Assessment: No/denies pain    Home Living Family/patient expects to be discharged to:: Private residence Living Arrangements: Children Available Help at Discharge: Family;Available PRN/intermittently Type of Home: House Home Access: Stairs to enter   Entrance Stairs-Number of Steps: 5 Home Layout: Multi-level Home Equipment: Kasandra Knudsen - single point (patient needs a rubber tip per patient) Additional Comments: daughter and son in law work, difficuklt to get a clear picture of where his bedroom is in regards to stairs as well as rails.    Prior Function Level of Independence: Independent with assistive device(s)               Hand Dominance        Extremity/Trunk Assessment   Upper Extremity Assessment: Generalized weakness           Lower Extremity Assessment: Generalized weakness      Cervical / Trunk Assessment: Kyphotic  Communication   Communication:  (speech is  slrred due to cancer.)  Cognition Arousal/Alertness: Awake/alert Behavior During Therapy: WFL for tasks assessed/performed Overall Cognitive Status: Within Functional Limits for tasks assessed                      General Comments      Exercises        Assessment/Plan    PT Assessment Patient needs continued PT services  PT Diagnosis Difficulty walking;Generalized weakness   PT Problem List Decreased activity tolerance;Decreased balance;Decreased mobility;Decreased knowledge of use of DME;Decreased  safety awareness;Decreased knowledge of precautions  PT Treatment Interventions DME instruction;Gait training;Stair training;Functional mobility training;Therapeutic activities;Therapeutic exercise;Patient/family education   PT Goals (Current goals can be found in the Care Plan section) Acute Rehab PT Goals Patient Stated Goal: to go home PT Goal Formulation: With patient Time For Goal Achievement: 01/14/15 Potential to Achieve Goals: Good    Frequency Min 3X/week   Barriers to discharge Decreased caregiver support;Inaccessible home environment multiple steps    Co-evaluation               End of Session Equipment Utilized During Treatment: Gait belt Activity Tolerance: Patient tolerated treatment well Patient left: in bed;with call bell/phone within reach;with bed alarm set Nurse Communication: Mobility status         Time: 1359-1420 PT Time Calculation (min) (ACUTE ONLY): 21 min   Charges:   PT Evaluation $Initial PT Evaluation Tier I: 1 Procedure     PT G CodesClaretha Cooper 12/31/2014, 3:29 PM Tresa Endo PT [phone]

## 2014-12-31 NOTE — Progress Notes (Signed)
  Oncology Nurse Navigator Documentation   Navigator Encounter Type: Treatment (12/31/14 1548) Patient Visit Type: YSDBNR (12/31/14 1605)     To provide support and encouragement, care continuity and to assess for needs, met patient during [facility] tmt and assisted his transport back to [facility]. He reported feeling somewhat better since his fall on Monday. He understands that Dr. Alvy Bimler is putting his chemo on hold for the time being. I will continue to follow during this admission.  [name], RN, BSN, [facility] [phone]                    Time Spent with Patient: 45 (12/31/14 1605)

## 2014-12-31 NOTE — Consult Note (Signed)
[place]  [phone]   Requesting Provider: [facility]  Consulting Provider: Heath Lark, MD  Primary Oncologist: Heath Lark, MD  Patient Care Team: Pcp Not In System as PCP - General Leota Sauers, RN as Oncology Nurse Navigator Heath Lark, MD as Consulting Physician (Hematology and Oncology) Eppie Gibson, MD as Attending Physician (Radiation Oncology) Karie Mainland, RD as Dietitian (Nutrition)  HOSPITAL CONSULTATION  NOTE I have seen the patient, examined him and edited the notes as follows  HPI: 79 year old man with a history of squamous cell carcinoma of the base of the tongue on concurrent cetuximab and radiation therapy, admitted by the Hospitalist team after presenting earlier on 10/31 to the [facility] for his chemo dose. At the time he was found to be hypotensive, and complaining of mucositis for which he had received IV fluids and pain meds, without significant improvement. Upon admission, his clinical status was complicated by confusion, now resolved. Denies fevers, chills, night sweats, vision changes. His mucositis is severe, and he reports pain. He is also being treated for thrush.  Denies any respiratory complaints. Denies any chest pain or palpitations. Denies lower extremity swelling. Denies nausea, heartburn or change in bowel habits. Last bowel movement today. Denies any dysuria. Denies abnormal skin rashes, or neuropathy. Denies any bleeding issues such as epistaxis, hematemesis, hematuria or hematochezia.   On admission, he received IV fluids, IV pain meds, and after cultures drawn, he was started on IV antibiotics. He is to continue radiation while here. We were kindly notified of the patient's admission.    Oncology History   Cancer of base of tongue   Staging form: Lip and Oral Cavity, AJCC 7th Edition     Clinical stage from 11/08/2014: Stage IVA (T4a, N2c, M0) - Signed by Heath Lark, MD on 11/08/2014       Cancer  of base of tongue ([place])   07/08/2014 Miscellaneous  the patient noted tongue swelling / mass   10/04/2014 Imaging  CT scan done at the [facility] show large tongue mass with bilateral neck lymphadenopathy   10/07/2014 Miscellaneous  he was seen at the [facility] for evaluation was noted to have tongue cancer   10/10/2014 Imaging  PET CT scan from the [facility] showed extensive regional lymphadenopathy on the left side including supraclavicular lymph node involvement and possibly right jugulodigastric lymph node involvement.   10/24/2014 Procedure  he underwent laryngoscopy and biopsy. there is a large ulcerative tongue a mass on the left involving the tonsil, soft palate, epiglottis.   10/24/2014 Pathology Results  pathology report from [facility] show squamous cell carcinoma with basaloid feature, P 16 positive by PCR   11/20/2014 Surgery he had multiple dental extractions   12/03/2014 Procedure he has port placement   12/04/2014 -  Chemotherapy He received weekly Erbitux   12/04/2014 -  Radiation Therapy he received radiation treatment   12/04/2014 Adverse Reaction he had infusion reaction from Erbitux   12/17/2014 Adverse Reaction He is noted to have worsening skin rash and oral mucositis related to side effects of Erbitux. The dose of Erbitux is reduced by 50%   12/30/14 Hospital He was admitted for Febrile Sepsis    Past Medical History  Diagnosis Date  . 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 Hypertension   . Retinitis pigmentosa of both eyes   . Anxiety   . Depression   . GERD (gastroesophageal reflux disease)  hx of   . Arthritis     shoulders, right knee   . Insomnia 12/24/2014     MEDICATIONS:  Scheduled Meds: . fluconazole (DIFLUCAN) IV  100 mg Intravenous Q24H  . heparin  5,000 Units Subcutaneous 3 times per day  . piperacillin-tazobactam (ZOSYN)  IV  3.375 g Intravenous Q8H  . sodium chloride  10-40 mL Intracatheter Q12H  . vancomycin  1,000 mg Intravenous Q12H  . zolpidem  5 mg Oral QHS     Continuous Infusions: . sodium chloride 100 mL/hr at 12/31/14 0504   PRN Meds:.ketorolac, lidocaine, ondansetron **OR** ondansetron (ZOFRAN) IV, polyethylene glycol, sodium chloride, sucralfate  ALLERGIES:  Allergies  Allergen Reactions  . Dilaudid [Hydromorphone Hcl] Other (See Comments)    Hallucinations  . Other Other (See Comments)    Seasonal allergies - sneezing, runny nose, watery eyes    Review of systems:   Constitutional: Denies fevers, chills or abnormal night sweats Eyes: Denies blurriness of vision, double vision or watery eyes Ears, nose, mouth, throat, and face:He reports severe mucositis and thrush Respiratory: Denies cough, dyspnea or wheezes Cardiovascular: Denies palpitation, chest discomfort or lower extremity swelling Gastrointestinal:  Denies nausea, heartburn or change in bowel habits Skin: Denies any new rashes or ulcerations Lymphatics: Denies new lymphadenopathy or easy bruising Neurological:Denies numbness, tingling or new weaknesses Behavioral/Psych: Mood is stable, no new changes  All other systems were reviewed with the patient and are negative.    Family History  Problem Relation Age of Onset  . Ovarian cancer Sister   . Heart attack Father   . Breast cancer      neice on Sister's side.  Also had tongue cancer.     Past Surgical History  Procedure Laterality Date  . Biopsy tongue  10/24/14  . Knee surgery Right   . Tonsilectomy, adenoidectomy, bilateral myringotomy and tubes    . Feedign tube srugery     . Multiple extractions with alveoloplasty N/A 11/20/2014    Procedure: Extraction of tooth #'s 1,15,16,17,20,31 with alveoloplasty and gross debridement of remaining teeth;  Surgeon: Lenn Cal, DDS;  Location: [facility];  Service: Oral Surgery;  Laterality: N/A;    Social History   Social History  . Marital Status: Widowed    Spouse Name: N/A  . Number of Children: 2  . Years of Education: N/A   Occupational History  .  Acupuncturist    Social History Main Topics  . Smoking status: Never Smoker   . Smokeless tobacco: Never Used  . Alcohol Use: No     Comment: hx of occ drink   . Drug Use: No  . Sexual Activity: No   Other Topics Concern  . Not on file   Social History Narrative     PHYSICAL EXAMINATION:  Filed Vitals:   12/31/14 0515  BP: 132/66  Pulse: 92  Temp: 97.4 F (36.3 C)  Resp: 20      Intake/Output Summary (Last 24 hours) at 12/31/14 0759 Last data filed at 12/31/14 0700  Gross per 24 hour  Intake    990 ml  Output      0 ml  Net    990 ml   GENERAL:alert, no distress and comfortable SKIN: skin color, texture, turgor are normal,persistent skin lesion consistent with cetuximab-induced skin reaction. No ulceration noted. EYES: normal, conjunctiva are pink and non-injected, sclera clear OROPHARYNX:  no erythema and lips, oral  mucositis is noted, and tongue with mild white coating. Mucous secretions  present NECK: supple, thyroid normal size, non-tender, without nodularity LYMPH:  persistent palpable lymphadenopathy, regressed in size LUNGS: clear to auscultation and percussion with normal breathing effort HEART: regular rate & rhythm and no murmurs and no lower extremity edema ABDOMEN: soft, non-tender and normal bowel sounds. Feeding tube looks normal. Musculoskeletal:no cyanosis of digits and no clubbing  PSYCH: alert & oriented x 3 with fluent speech NEURO: no focal motor/sensory deficits  LABORATORY/RADIOLOGY DATA:   Recent Labs Lab 12/30/14 1237 12/31/14 0500  WBC 16.8* 10.2  HGB 10.9* 9.1*  HCT 34.7* 29.2*  PLT 357 319  MCV 79.4 80.2  MCH 24.9* 25.0*  MCHC 31.4 31.2  RDW 14.7 14.9  LYMPHSABS 0.7  --   MONOABS 0.9  --   EOSABS 0.0  --   BASOSABS 0.0  --     CMP    Recent Labs Lab 12/30/14 1237 12/31/14 0500  NA 132* 137  K 4.4 3.7  CL 98* 108  CO2 25 23  GLUCOSE 114* 88  BUN 21* 16  CREATININE 0.91 0.74  CALCIUM 8.5* 8.0*  AST 19 14*    ALT 15* 12*  ALKPHOS 51 39  BILITOT 0.6 0.9        Component Value Date/Time   BILITOT 0.9 12/31/2014 0500   BILITOT <0.30 12/23/2014 1502     Recent Labs Lab 12/30/14 2117  INR 1.11      Urinalysis    Component Value Date/Time   COLORURINE YELLOW 12/30/2014 1437   APPEARANCEUR CLEAR 12/30/2014 1437   LABSPEC 1.016 12/30/2014 1437   PHURINE 7.0 12/30/2014 1437   GLUCOSEU NEGATIVE 12/30/2014 1437   HGBUR NEGATIVE 12/30/2014 1437   BILIRUBINUR NEGATIVE 12/30/2014 [place] 12/30/2014 1437   PROTEINUR 30* 12/30/2014 1437   UROBILINOGEN 1.0 12/30/2014 1437   NITRITE NEGATIVE 12/30/2014 1437   LEUKOCYTESUR NEGATIVE 12/30/2014 1437    Liver Function Tests:  Recent Labs Lab 12/30/14 1237 12/31/14 0500  AST 19 14*  ALT 15* 12*  ALKPHOS 51 39  BILITOT 0.6 0.9  PROT 6.6 5.4*  ALBUMIN 2.8* 2.2*    Radiology Studies:  Dg Chest 2 View  12/30/2014  CLINICAL DATA:  Mental status changes in weakness over the last 2 days. Fell yesterday. EXAM: CHEST  2 VIEW COMPARISON:  None. FINDINGS: Heart size is normal. There is atherosclerosis of the aorta. Power port in place from a right internal jugular approach has its tip in the SVC above the right atrium. The vascularity is normal. The lungs are clear. No effusions. No acute bone findings. IMPRESSION: No active cardiopulmonary disease. Electronically Signed   By: Nelson Chimes M.D.   On: 12/30/2014 13:34   Ct Head Wo Contrast  12/30/2014  . COMPARISON:  09/30/2014 FINDINGS: Generalized atrophy. Normal ventricular morphology. No midline shift or mass effect. Small vessel chronic ischemic changes of deep cerebral white matter. No intracranial hemorrhage, mass lesion, or acute infarction. Visualized paranasal sinuses and mastoid air cells clear. Bones unremarkable. Atherosclerotic calcification of internal carotid and vertebral arteries at skullbase. IMPRESSION: Atrophy with small vessel chronic ischemic changes of  deep cerebral white matter. No acute intracranial abnormalities. Electronically Signed   By: Lavonia Dana M.D.   On: 12/30/2014 13:26   Ct Soft Tissue Neck W Contrast  12/30/2014  COMPARISON:  Outside [facility] neck CT 10/04/2014 available on [facility] PACS FINDINGS: Pharynx and larynx: Regressed but not resolved mass like hyper enhancement at the left tongue base and glossal tonsillar  sulcus. Residual masslike hyper enhancement is poorly marginated. There is generalized pharyngeal mucosal space edema including involvement of the epiglottis. Negative parapharyngeal spaces. Retropharyngeal course of both carotid arteries. Trace retropharyngeal effusion. Salivary glands: Abnormal left sublingual space which appears primarily due to extensive oral tongue and sublingual extension of the primary tumor. This is better demonstrated on 10/04/2014. Stable submandibular gland ; mild postradiation changes. Negative parotid glands. Thyroid: Negative. Lymph nodes: Malignant left level 2/level 3 lymphadenopathy is stable to perhaps slightly increased in size since August, with partially cystic/ necrotic nodes measuring 20-23 mm diameter individually today versus 20 mm previously. Right side cervical lymph nodes appear stable, the largest at right level IIa a measuring 8 mm short axis. Vascular: Increased effacement of the left internal jugular vein since August related to the malignant nodes, but this does remain patent. Retropharyngeal course of both common and internal carotid arteries. Major vascular structures in the neck and at the skullbase remain patent. Carotid calcified atherosclerosis. Right IJ approach porta cath partially visible. Limited intracranial: Negative. Visualized orbits: Not included. Mastoids and visualized paranasal sinuses: Minimal left maxillary alveolar recess mucosal thickening, otherwise Visualized paranasal sinuses and mastoids are clear. Skeleton: Some interval tooth extractions.  Degenerative changes in the cervical spine. Degenerative cervical spinal stenosis. No suspicious osseous lesion identified in the neck. Upper chest: No superior mediastinal lymphadenopathy. Negative lung apices. IMPRESSION: 1. Some regression of the large left tongue tumor with involvement of the sublingual space. Tumor margins now indistinct. 2. Stable to slightly increased malignant left level 2 and level 3 nodes since August. Increased effacement of the left internal jugular vein which remains patent. 3. Otherwise expected postradiation changes in the neck soft tissues. 4. Advanced cervical spine degeneration with degenerative spinal stenosis. Electronically Signed   By: Genevie Ann M.D.   On: 12/30/2014 15:47    ASSESSMENT AND PLAN:  Sepsis Fever Patient was admitted with fevers and confusion. Cultures were drawn, currently pending. Initially on IV antibiotics with vancomycin and Zosyn, these had been changed to Rocephin and azithromycin as as fever has improved and his leukocytosis has resolved. Continue current care  Cancer of base of tongue ([place]) Squamous cell carcinoma of the base of the tongue on concurrent cetuximab and radiation therapy, He is getting progressively more symptomaticand weak while on treatment week by week. He was on  IV fluid support daily due to multiple symptoms including mucositis, fatigue, pain. Chemotherapy is currently on hold due to current hospitalization for sepsis He is to continue radiation therapy, as the benefits overweight the risks. I do not believe the patient should get any further systemic treatment. I will cancel his chemotherapy next week and reassess. 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 Continue supportive therapy with IV Dilaudid as needed, as well as Diflucan Continue oral rinses  Rash,  skin He has cetuximab-induced skin rash. This is stable and improved.   S/P gastrostomy tube (G tube) placement, follow-up exam The feeding tube site looks okay without signs of infection.  Protein calorie malnutrition ([place]) He has progressive worsening calorie malnutrition. He will continue close monitoring by dietitian in hospital. Recommend IV fluid support daily  Acute encephalopathy, resolved Resolved, likely infectious and dehydration, hyponatremia  Anemia, microcytic Due to recent chemotherapy, malnutrition. Dilution, infection, antibiotics  He was initiated on ferrous sulfate 3 times a day No transfusion is indicated at this time  Monitor counts closely Transfuse blood to maintain a Hb of 8 g or if the patient is acutely bleeding  DVT prophylaxis On Lovenox  Full Code  Other medical issues as per admitting team    [employer] E, PA-C 12/31/2014, 7:59 AM  Joffre, Alley Neils, MD 12/31/2014

## 2014-12-31 NOTE — Progress Notes (Signed)
[facility] [place] [facility] Dept Therapy Treatment Record Phone [phone]   Radiation Therapy was administered to Kenneth Jordan on: 12/31/2014  4:38 PM and was treatment # 18 out of a planned course of 35 treatments.

## 2015-01-01 ENCOUNTER — Ambulatory Visit: Payer: Self-pay | Admitting: Hematology and Oncology

## 2015-01-01 ENCOUNTER — Encounter: Payer: Self-pay | Admitting: Nutrition

## 2015-01-01 ENCOUNTER — Telehealth: Payer: Self-pay | Admitting: Nutrition

## 2015-01-01 ENCOUNTER — Ambulatory Visit
Admission: RE | Admit: 2015-01-01 | Discharge: 2015-01-01 | Disposition: A | Discharge status: Home / Self Care | Payer: Non-veteran care | Source: Ambulatory Visit | Attending: Radiation Oncology | Admitting: Radiation Oncology

## 2015-01-01 ENCOUNTER — Ambulatory Visit: Payer: Self-pay

## 2015-01-01 DIAGNOSIS — Z510 Encounter for antineoplastic radiation therapy: Secondary | ICD-10-CM | POA: Diagnosis not present

## 2015-01-01 DIAGNOSIS — R1310 Dysphagia, unspecified: Secondary | ICD-10-CM

## 2015-01-01 DIAGNOSIS — R509 Fever, unspecified: Secondary | ICD-10-CM

## 2015-01-01 LAB — GLUCOSE, CAPILLARY
GLUCOSE-CAPILLARY: 89 mg/dL (ref 65–99)
Glucose-Capillary: 109 mg/dL — ABNORMAL HIGH (ref 65–99)
Glucose-Capillary: 138 mg/dL — ABNORMAL HIGH (ref 65–99)
Glucose-Capillary: 168 mg/dL — ABNORMAL HIGH (ref 65–99)
Glucose-Capillary: 174 mg/dL — ABNORMAL HIGH (ref 65–99)
Glucose-Capillary: 90 mg/dL (ref 65–99)

## 2015-01-01 LAB — LEGIONELLA PNEUMOPHILA SEROGP 1 UR AG: L. pneumophila Serogp 1 Ur Ag: NEGATIVE

## 2015-01-01 NOTE — Care Management Note (Signed)
Case Management Note  Patient Details  Name: [name] MRN: [id number] Date of Birth: April 30, 1935  Subjective/Objective:                    Action/Plan: Pt from home. Spoke with pt who referred CM to daughter Kenneth Jordan.  A call was made to [facility] who wants SNF for pt related to no one being there during the day. Kenneth Jordan states pt was with [name]. Will need orders for HHRN/PT.  D[facility] ago states [facility] has [facility]    Expected Discharge Date:   (UNKNOWN)               Expected Discharge Plan:  [place]  In-House Referral:  Clinical Social Work  Discharge planning Services  CM Consult  Post Acute Care Choice:    Choice offered to:  Adult Children  DME Arranged:    DME Agency:     HH Arranged:  RN, PT HH Agency:  [place]  Status of Service:  In process, will continue to follow  Medicare Important Message Given:    Date Medicare IM Given:    Medicare IM give by:    Date Additional Medicare IM Given:    Additional Medicare Important Message give by:     If discussed at [place] of Stay Meetings, dates discussed:    Additional CommentsPurcell Mouton, RN 01/01/2015, 1:31 PM

## 2015-01-01 NOTE — Progress Notes (Signed)
Kenneth Jordan   DOB:May 12, 1935   PO#:[id number]   YHO#:[id number]  I have seen the patient, examined him and edited the notes as follows  Subjective: Patient was asleep, but easily responsive to commands. Nursing reports no new events overnight. Denies fevers, chills, night sweats, vision changes. His mucositis is better controlled with supportive therapy. Denies any respiratory complaints. Denies any chest pain or palpitations. Denies lower extremity swelling. Denies nausea, heartburn  Last bowel movement last night, lose, without recurrence. Denies any dysuria. Denies abnormal skin rashes, or neuropathy. Denies any bleeding issues such as epistaxis, hematemesis, hematuria or hematochezia.   Scheduled Meds: . azithromycin  500 mg Intravenous Q24H  . cefTRIAXone (ROCEPHIN)  IV  1 g Intravenous Q24H  . feeding supplement (OSMOLITE 1.5 CAL)  237 mL Per Tube 6 X Daily  . feeding supplement (PRO-STAT SUGAR FREE 64)  30 mL Per Tube BID  . ferrous sulfate  300 mg Oral TID WC  . fluconazole (DIFLUCAN) IV  100 mg Intravenous Q24H  . free water  100 mL Per Tube Q4H  . heparin  5,000 Units Subcutaneous 3 times per day  . polyethylene glycol  17 g Oral BID  . sodium chloride  10-40 mL Intracatheter Q12H  . zolpidem  5 mg Oral QHS   Continuous Infusions:  PRN Meds:.ketorolac, lidocaine, ondansetron **OR** ondansetron (ZOFRAN) IV, polyethylene glycol, sodium chloride, sucralfate  Objective:  Filed Vitals:   01/01/15 0420  BP: 128/62  Pulse: 90  Temp: 98.7 F (37.1 C)  Resp: 20    Body mass index is 24.6 kg/(m^2).  Intake/Output Summary (Last 24 hours) at 01/01/15 0703 Last data filed at 01/01/15 2060  Gross per 24 hour  Intake   1908 ml  Output    603 ml  Net   1305 ml   GENERALsomnolent, no distress and comfortable SKIN: skin color, texture, turgor are normal,persistent skin lesion consistent with cetuximab-induced skin reaction. No ulceration noted. EYES: normal, conjunctiva are pink  and non-injected, sclera clear OROPHARYNX: no erythema and lips, oral mucositis is noted, and tongue with mild white coating. Mucous secretions present NECK: supple, thyroid normal size, non-tender, without nodularity LYMPH: persistent palpable lymphadenopathy, regressed in size LUNGS: clear to auscultation and percussion with normal breathing effort HEART: regular rate & rhythm and no murmurs and no lower extremity edema ABDOMEN: soft, non-tender and normal bowel sounds. Feeding tube looks normal. Musculoskeletal:no cyanosis of digits and no clubbing  PSYCH: alert & oriented x 3 with fluent speech NEURO: no focal motor/sensory deficits   CBG (last 3)   Recent Labs  12/31/14 2102 01/01/15 0018 01/01/15 0418  GLUCAP 79 109* 90      Labs:   Recent Labs Lab 12/30/14 1237 12/31/14 0500  WBC 16.8* 10.2  HGB 10.9* 9.1*  HCT 34.7* 29.2*  PLT 357 319  MCV 79.4 80.2  MCH 24.9* 25.0*  MCHC 31.4 31.2  RDW 14.7 14.9  LYMPHSABS 0.7  --   MONOABS 0.9  --   EOSABS 0.0  --   BASOSABS 0.0  --      Chemistries:    Recent Labs Lab 12/30/14 1237 12/31/14 0500  NA 132* 137  K 4.4 3.7  CL 98* 108  CO2 25 23  GLUCOSE 114* 88  BUN 21* 16  CREATININE 0.91 0.74  CALCIUM 8.5* 8.0*  AST 19 14*  ALT 15* 12*  ALKPHOS 51 39  BILITOT 0.6 0.9      Estimated Creatinine Clearance: 77.3 mL/min (by  C-G formula based on Cr of 0.74). Liver Function Tests:  Recent Labs Lab 12/30/14 1237 12/31/14 0500  AST 19 14*  ALT 15* 12*  ALKPHOS 51 39  BILITOT 0.6 0.9  PROT 6.6 5.4*  ALBUMIN 2.8* 2.2*   No results for input(s): LIPASE, AMYLASE in the last 168 hours. No results for input(s): AMMONIA in the last 168 hours.  Coagulation profile  Recent Labs Lab 12/30/14 2117  INR 1.11     Microbiology Cultures pending  Studies:  No new imaging studies  Assessment / Plan:   Sepsis Fever, resolved Patient was admitted with fevers and confusion. Cultures were drawn,  currently pending. Initially on IV antibiotics with vancomycin and Zosyn, these had been changed to Rocephin and azithromycin as fever has improved and his leukocytosis has resolved. Continue current care  Cancer of base of tongue ([place]) Squamous cell carcinoma of the base of the tongue on concurrent cetuximab and radiation therapy CT scan showed positive disease control He is getting progressively more symptomatic and weak while on treatment week by week. He was on IV fluid support daily due to multiple symptoms including mucositis, fatigue, pain. Chemotherapy is currently on hold due to current hospitalization for sepsis He is to continue radiation therapy,s/p 18 of planned 35 treatments, as the benefits overweight the risks.  No further systemic treatment is recommended. Next week's chemotherapy was cancelled, will reassess.  Stomal mucositis (HCC) OralThrush, improving He has worsening mucositis on his tongue, likely due to treatment, despite significant doses adjustment of cetuximab. Symptoms are better controlled with current therapy Continue supportive therapy with IV Dilaudid as needed, as well as Diflucan Continue oral rinses  Rash, skin He has cetuximab-induced skin rash. This is stable and improved.   S/P gastrostomy tube (G tube) placement, follow-up exam The feeding tube site looks okay without signs of infection.  Protein calorie malnutrition ([place]) He has progressive worsening calorie malnutrition. He will continue close monitoring by dietitian in hospital. Continue IV fluid support daily  Acute encephalopathy, resolved Resolved, likely infectious and dehydration, hyponatremia  Anemia, microcytic Due to recent chemotherapy, malnutrition. Dilution, infection, antibiotics  He was initiated on ferrous sulfate 3 times a day No transfusion is indicated at this time Monitor counts closely Transfuse blood to maintain a Hb of 8 g or if the patient is acutely  bleeding  DVT prophylaxis On Lovenox  Deconditioning Appreciate PT/OT involvement.  Full Code Other medical issues as per admitting team   Kenneth Jordan 01/01/2015  7:03 AM Medical Oncology and Hematology [facility]

## 2015-01-01 NOTE — Progress Notes (Signed)
Pt tolerated nightly feeds well, not complaints of nausea and no abdominal distention.

## 2015-01-01 NOTE — Progress Notes (Signed)
[facility] PROGRESS NOTE  Kenneth Jordan QZR:[id number] DOB: [birth year] DOA: 12/30/2014 PCP: Pcp Not In System  Assessment/Plan: 1. Sepsis- patient was empirically started on vancomycin and Zosyn. Which was discontinued and patient then started on Rocephin and Zithromax. Chest x-ray did not shows significant abnormality. Patient's fever resolved and leukocytosis improved. Swallow evaluation has been ordered which is currently pending. 2. Stomal mucositis/oral thrush- improving currently patient on Diflucan, oral rinses 3. Nutrition- patient has gastrostomy tube in place, getting Osmolite 237 mL 6 times daily. 4. 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 5. Protein calorie malnutrition-nutrition has seen the patient and adjusted  the tube feedings. 6. Question dysphagia- minute space therapy evaluation.  Code Status: Full code Family Communication: No family at bedside Disposition Plan:  home when medically stable   Consultants:  None   Procedures  *None   Antibiotics:  Rocephin       Zithromax  HPI/Subjective: Kenneth Jordan is an 79 y.o. 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 This morning patient denies any complaints. No chest pain or shortness of breath.  Objective: Filed Vitals:   01/01/15 1335  BP: 128/63  Pulse: 91  Temp: 97.9 F (36.6 C)  Resp: 20    Intake/Output Summary (Last 24 hours) at 01/01/15 1520 Last data filed at 01/01/15 1230  Gross per 24 hour  Intake   1261 ml  Output    602 ml  Net    659 ml   Filed Weights   12/30/14 1839 01/01/15 0420  Weight: 75.4 kg (166 lb 3.6 oz) 75.6 kg (166 lb 10.7 oz)    Exam:   General:  Appears in no acute distress  Cardiovascular: S1-S2  regular  Respiratory: Clear to auscultation bilaterally  Abdomen: Soft, nontender, no organomegaly  Musculoskeletal: No cyanosis/clubbing/edema of the lower extremities   Data Reviewed: Basic Metabolic Panel:  Recent Labs Lab 12/30/14 1237 12/31/14 0500  NA 132* 137  K 4.4 3.7  CL 98* 108  CO2 25 23  GLUCOSE 114* 88  BUN 21* 16  CREATININE 0.91 0.74  CALCIUM 8.5* 8.0*   Liver Function Tests:  Recent Labs Lab 12/30/14 1237 12/31/14 0500  AST 19 14*  ALT 15* 12*  ALKPHOS 51 39  BILITOT 0.6 0.9  PROT 6.6 5.4*  ALBUMIN 2.8* 2.2*   No results for input(s): LIPASE, AMYLASE in the last 168 hours. No results for input(s): AMMONIA in the last 168 hours. CBC:  Recent Labs Lab 12/30/14 1237 12/31/14 0500  WBC 16.8* 10.2  NEUTROABS 15.1*  --   HGB 10.9* 9.1*  HCT 34.7* 29.2*  MCV 79.4 80.2  PLT 357 319     CBG:  Recent Labs Lab 12/31/14 2102 01/01/15 0018 01/01/15 0418 01/01/15 0722 01/01/15 1225  GLUCAP 79 109* 90 168* 89    Recent Results (from the past 240 hour(s))  Culture, blood (routine x 2)     Status: None (Preliminary result)   Collection Time: 12/30/14 12:40 PM  Result Value Ref Range Status   Specimen Description BLOOD LEFT ANTECUBITAL  Final   Special Requests BOTTLES DRAWN AEROBIC ONLY 5CC  Final   Culture   Final    NO GROWTH 2 DAYS Performed at [facility]  Report Status PENDING  Incomplete  Culture, blood (routine x 2)     Status: None (Preliminary result)   Collection Time: 12/30/14 12:40 PM  Result Value Ref Range Status   Specimen Description BLOOD RIGHT ANTECUBITAL  Final   Special Requests BOTTLES DRAWN AEROBIC AND ANAEROBIC 5CC  Final   Culture   Final    NO GROWTH 2 DAYS Performed at [facility]    Report Status PENDING  Incomplete  Urine culture     Status: None (Preliminary result)   Collection Time: 12/30/14  2:37 PM  Result Value Ref Range Status   Specimen Description URINE, CLEAN CATCH  Final    Special Requests NONE  Final   Culture   Final    40,000 COLONIES/ml GRAM NEGATIVE RODS Performed at [facility]    Report Status PENDING  Incomplete     Studies: No results found.  Scheduled Meds: . azithromycin  500 mg Intravenous Q24H  . cefTRIAXone (ROCEPHIN)  IV  1 g Intravenous Q24H  . feeding supplement (OSMOLITE 1.5 CAL)  237 mL Per Tube 6 X Daily  . feeding supplement (PRO-STAT SUGAR FREE 64)  30 mL Per Tube BID  . ferrous sulfate  300 mg Oral TID WC  . fluconazole (DIFLUCAN) IV  100 mg Intravenous Q24H  . free water  100 mL Per Tube Q4H  . heparin  5,000 Units Subcutaneous 3 times per day  . sodium chloride  10-40 mL Intracatheter Q12H  . zolpidem  5 mg Oral QHS   Continuous Infusions:   Active Problems:   Cancer of base of tongue (HCC)   Dysphagia   Protein calorie malnutrition (HCC)   Fever in adult   Oral thrush   Acute encephalopathy   Sepsis ([place])   Microcytic anemia    Time spent: 25 min    [facility]  [facility] Pager [phone]*. If 7PM-7AM, please contact night-coverage at [email or web address], password [facility] 01/01/2015, 3:20 PM  LOS: 2 days

## 2015-01-01 NOTE — Telephone Encounter (Signed)
Patient's daughter contacted me by phone. Patient has been admitted to the hospital and daughter was concerned regarding tube feeding. Contacted inpatient RDs who will be sure patient is getting correct amount of feeding. Called daughter back to communicate appropriate orders and administration of tube feeding. Encouraged daughter to contact me if she has further questions.

## 2015-01-01 NOTE — Progress Notes (Signed)
Nutrition Follow-up  INTERVENTION:   Recommend speech evaluation, if not already ordered.  Continue to provide 6 cans of Osmolite 1.5 daily, 1 can every 3 hours (0600, 0900, 1200, 1500,1800, 2100) If able to tolerate today, may advance to 2 cans TID. Continue 30 ml Prostat BID Continue 100 ml free water flushes QID  Tube feeding regimen provides 2330 kcal (100% of needs), 120 g protein and 1486 ml free water.  RD to continue to monitor  NUTRITION DIAGNOSIS:   Inadequate oral intake related to cancer and cancer related treatments as evidenced by other (see comment), NPO status (PEG, Tubefeed Consult).  Ongoing.  GOAL:   Patient will meet greater than or equal to 90% of their needs  Meeting with TF.  MONITOR:   Labs, Weight trends, TF tolerance, Skin, I & O's  ASSESSMENT:   Kenneth Jordan is an 79 y.o. 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 Most of the history is obtained from the daughter relates that he has been confused since then progressively getting worse. He felt at the house the day prior to admission and cut himself above the left eyebrow. She denies that he has been complaining of any shortness of breath he's had some cough over the last 4 days, some fever at home. He is not wanted to eat, he obtains most of his feeding through his feeding tube.  RD contacted by [facility] RD regarding patient and his tube feeding. Daughter contacted RD with questions and concerns for tube feeding rate. Per daughter, pt was tolerating 2 cans of Nutren in the morning and 2 cans at night. PTA he was drinking 2 bottles of Ensure Plus to meet his additional needs.  This RD spoke with  patient and RN in room. Per RN, pt had one bolus feed this morning and is about to have another feeding. Encouraged 1 can 6 times daily. If patient is able to tolerate this rate, may advance to 2 cans TID. RD will continue to monitor for ability to advance. RN also to check with MD about speech evaluation.  Patient reports wanting something to drink and c/o dry mouth. Pt is NPO. RN to bring him mouth swabs to aid his mouth dryness.   Labs reviewed: CBGs: 89-168  Diet Order:  Diet NPO time specified  Skin:  Reviewed, no issues  Last BM:  11/1  Height:   Ht Readings from Last 1 Encounters:  12/30/14 5\' 9"  (1.753 m)    Weight:   Wt Readings from Last 1 Encounters:  01/01/15 166 lb 10.7 oz (75.6 kg)    Ideal Body Weight:  72.7 kg  BMI:  Body mass index is 24.6 kg/(m^2).  Estimated Nutritional Needs:   Kcal:  2200 - 2400 calories  Protein:  110 - 125g pro  Fluid:  2.2 - 2.4L  EDUCATION NEEDS:   No education needs identified at this time  Clayton Bibles, MS, RD, LDN Pager: [phone] After Hours Pager: [phone]

## 2015-01-01 NOTE — Progress Notes (Signed)
PT Cancellation Note  Patient Details Name: Kenneth Jordan MRN: [id number] DOB: March 20, 1935   Cancelled Treatment:    Reason Eval/Treat Not Completed: Patient declined, no reason specified. Pt requested PT check back another day.    Weston Anna, MPT Pager: [phone]

## 2015-01-01 NOTE — NC FL2 (Signed)
[place] LEVEL OF CARE SCREENING TOOL     IDENTIFICATION  Patient Name: Kenneth Jordan Birthdate: May 26, 1935 Sex: male Admission Date (Current Location): 12/30/2014  [place] and [place] Number: [facility] and Address:  [facility],  [place] [address] [place]      Provider Number: [id number]  Attending Physician Name and Address:  Oswald Hillock, MD  Relative Name and Phone Number:       Current Level of Care: Hospital Recommended Level of Care: [place] Prior Approval Number:    Date Approved/Denied:   PASRR Number:    Discharge Plan: SNF    Current Diagnoses: Patient Active Problem List   Diagnosis Date Noted  . Fever in adult 12/30/2014  . Oral thrush 12/30/2014  . Acute encephalopathy 12/30/2014  . Sepsis ([place]) 12/30/2014  . Microcytic anemia 12/30/2014  . Protein calorie malnutrition ([place]) 12/25/2014  . Insomnia 12/24/2014  . Stomal mucositis ([place]) 12/17/2014  . Rash, skin 12/10/2014  . Throat pain in adult 12/10/2014  . Hypersensitivity reaction 12/04/2014  . S/P gastrostomy tube (G tube) placement, follow-up exam 12/02/2014  . Drug-induced hypotension 12/02/2014  . Constipation due to opioid therapy 12/02/2014  . Cancer of base of tongue ([place]) 11/08/2014  . Weight loss 11/08/2014  . Hemoptysis 11/08/2014  . Dysphagia 11/08/2014    Orientation ACTIVITIES/SOCIAL BLADDER RESPIRATION    Self, Place  Active Continent Normal  BEHAVIORAL SYMPTOMS/MOOD NEUROLOGICAL BOWEL NUTRITION STATUS      Continent Feeding tube  PHYSICIAN VISITS COMMUNICATION OF NEEDS Height & Weight Skin    Verbally 5\' 9"  (175.3 cm) 166 lbs. Normal          AMBULATORY STATUS RESPIRATION    Supervision limited Normal      Personal Care Assistance Level of Assistance  Bathing, Dressing Bathing Assistance: Limited assistance   Dressing Assistance: Limited assistance      Functional Limitations Info   Hearing, Speech   Hearing Info: Impaired Speech Info: Impaired (slurred speech due to tongue cancer)       SPECIAL CARE FACTORS FREQUENCY  PT (By licensed PT)     PT Frequency: 5 x week             Additional Factors Info  Psychotropic Code Status Info: FULL code status Allergies Info: Dilaudid, Other Psychotropic Info: Ambien         Current Medications (01/01/2015): Current Facility-Administered Medications  Medication Dose Route Frequency Provider Last Rate Last Dose  . azithromycin (ZITHROMAX) 500 mg in dextrose 5 % 250 mL IVPB  500 mg Intravenous Q24H Charlynne Cousins, MD   500 mg at 01/01/15 1402  . cefTRIAXone (ROCEPHIN) 1 g in dextrose 5 % 50 mL IVPB  1 g Intravenous Q24H Charlynne Cousins, MD   1 g at 01/01/15 1256  . feeding supplement (OSMOLITE 1.5 CAL) liquid 237 mL  237 mL Per Tube 6 X Daily Satira Anis Ward, RD   237 mL at 01/01/15 1557  . feeding supplement (PRO-STAT SUGAR FREE 64) liquid 30 mL  30 mL Per Tube BID Satira Anis Ward, RD   30 mL at 01/01/15 0913  . ferrous sulfate 300 (60 FE) MG/5ML syrup 300 mg  300 mg Oral TID WC Charlynne Cousins, MD   300 mg at 01/01/15 1227  . fluconazole (DIFLUCAN) IVPB 100 mg  100 mg Intravenous Q24H Charlynne Cousins, MD   100 mg at 12/31/14 2204  . free  water 100 mL  100 mL Per Tube Q4H Satira Anis Ward, RD   100 mL at 01/01/15 1329  . heparin injection 5,000 Units  5,000 Units Subcutaneous 3 times per day Charlynne Cousins, MD   5,000 Units at 01/01/15 1402  . ketorolac (TORADOL) 15 MG/ML injection 15 mg  15 mg Intravenous Q6H PRN Charlynne Cousins, MD      . lidocaine (XYLOCAINE) 2 % viscous mouth solution 15 mL  15 mL Mouth/Throat Q3H PRN Charlynne Cousins, MD      . ondansetron [facility]) tablet 4 mg  4 mg Oral Q6H PRN Charlynne Cousins, MD       Or  . ondansetron [facility]) injection 4 mg  4 mg Intravenous Q6H PRN Charlynne Cousins, MD      . polyethylene glycol [facility] / GLYCOLAX) packet 17 g  17 g Oral  Daily PRN Charlynne Cousins, MD      . sodium chloride 0.9 % injection 10-40 mL  10-40 mL Intracatheter Q12H Charlynne Cousins, MD      . sodium chloride 0.9 % injection 10-40 mL  10-40 mL Intracatheter PRN Charlynne Cousins, MD      . sucralfate (CARAFATE) 1 GM/10ML suspension 1 g  1 g Oral QID PRN Charlynne Cousins, MD      . zolpidem [facility]) tablet 5 mg  5 mg Oral QHS Charlynne Cousins, MD   5 mg at 12/30/14 2200   Do not use this list as official medication orders. Please verify with discharge summary.  Discharge Medications:   Medication List    ASK your doctor about these medications        acetaminophen 325 MG tablet  Commonly known as:  TYLENOL  Take 650 mg by mouth every 6 (six) hours as needed (For pain.).     clindamycin 1 % gel  Commonly known as:  CLINDAGEL  Apply topically 2 (two) times daily.     fluticasone 50 MCG/ACT nasal spray  Commonly known as:  FLONASE  Place 1 spray into the nose every morning.     HYDROcodone-acetaminophen 7.5-325 mg/15 ml solution  Commonly known as:  HYCET  Take 15 mLs by mouth every 6 (six) hours as needed for moderate pain.     hydrocortisone 1 % ointment  Apply 1 application topically 2 (two) times daily. 1 large tube     lidocaine 2 % solution  Commonly known as:  XYLOCAINE  Mix 1 part 2%viscous lidocaine,1part H2O.Swish and/or swallow 44mL of this mixture,73min before meals and at bedtime, up to QID     lidocaine-prilocaine cream  Commonly known as:  EMLA  Apply to affected area once     loratadine 10 MG tablet  Commonly known as:  CLARITIN  Take 10 mg by mouth daily as needed for allergies.     Menthol (Topical Analgesic) 16 % Liqd  Apply 1 application topically daily. Applies for shoulder pain.     metoCLOPramide 10 MG tablet  Commonly known as:  REGLAN  Take 1 tablet (10 mg total) by mouth 4 (four) times daily -  before meals and at bedtime.     morphine 20 MG/ML concentrated solution  Commonly known  as:  ROXANOL  Place 0.5 mLs (10 mg total) into feeding tube every 2 (two) hours as needed for severe pain.     omeprazole 20 MG capsule  Commonly known as:  PRILOSEC  Take 20 mg by mouth at bedtime  as needed (For heartburn or acid reflux.).     ondansetron 8 MG tablet  Commonly known as:  ZOFRAN  Take 1 tablet (8 mg total) by mouth every 8 (eight) hours as needed for nausea.     PRESCRIPTION MEDICATION  Patient receives his chemo treatments at the [facility] with Dr. Alvy Bimler. He is receiving Cetuximab every 7 days. His last dose was on 12/25/14.     prochlorperazine 10 MG tablet  Commonly known as:  COMPAZINE  Take 1 tablet (10 mg total) by mouth every 6 (six) hours as needed.     sodium fluoride 1.1 % Gel dental gel  Commonly known as:  FLUORISHIELD  Instill one drop of gel per tooth space of fluoride tray. Place over teeth for 5 minutes. Remove. Spit out excess. Repeat nightly     sucralfate 1 GM/10ML suspension  Commonly known as:  CARAFATE  Take 1 g by mouth 4 (four) times daily as needed (For sore throat.).     zolpidem 10 MG tablet  Commonly known as:  AMBIEN  Take 0.5 tablets (5 mg total) by mouth at bedtime.        Relevant Imaging Results:  Relevant Lab Results:  Recent Labs    Additional Information SSN: [id number] Pt undergoing radiation therapy,s/p 18 of planned 35 treatments. Radiation treatments are at [facility]. Chemotherapy is currently on hold due to current hospitalization for sepsis, Next week's chemotherapy was cancelled, will reassess.  KIDD, SUZANNA A, LCSW

## 2015-01-02 ENCOUNTER — Ambulatory Visit
Admission: RE | Admit: 2015-01-02 | Discharge: 2015-01-02 | Disposition: A | Discharge status: Home / Self Care | Payer: Non-veteran care | Source: Ambulatory Visit | Attending: Radiation Oncology | Admitting: Radiation Oncology

## 2015-01-02 ENCOUNTER — Ambulatory Visit: Payer: Self-pay

## 2015-01-02 DIAGNOSIS — N390 Urinary tract infection, site not specified: Secondary | ICD-10-CM

## 2015-01-02 DIAGNOSIS — B370 Candidal stomatitis: Secondary | ICD-10-CM

## 2015-01-02 LAB — URINE CULTURE

## 2015-01-02 LAB — GLUCOSE, CAPILLARY
GLUCOSE-CAPILLARY: 91 mg/dL (ref 65–99)
Glucose-Capillary: 129 mg/dL — ABNORMAL HIGH (ref 65–99)
Glucose-Capillary: 85 mg/dL (ref 65–99)
Glucose-Capillary: 119 mg/dL — ABNORMAL HIGH (ref 65–99)
Glucose-Capillary: 133 mg/dL — ABNORMAL HIGH (ref 65–99)
Glucose-Capillary: 97 mg/dL (ref 65–99)

## 2015-01-02 MED ORDER — LORATADINE 10 MG PO TABS
10.0000 mg | ORAL_TABLET | Freq: Every day | ORAL | Status: DC
  Administered 2015-01-03: 10 mg via ORAL
  Filled 2015-01-02: qty 1

## 2015-01-02 NOTE — Care Management Note (Signed)
Case Management Note  Patient Details  Name: Kenneth Jordan MRN: [id number] Date of Birth: [birth year]  Subjective/Objective:    Per [place] Go rep Tim-out of network. Will await dtr return call to offer [facility] agency choice.                Action/Plan:d/c plan home w/HHC.   Expected Discharge Date:   (UNKNOWN)               Expected Discharge Plan:  [place]  In-House Referral:  Clinical Social Work  Discharge planning Services  CM Consult  Post Acute Care Choice:    Choice offered to:  Adult Children  DME Arranged:    DME Agency:     HH Arranged:  RN, PT HH Agency:  [place]  Status of Service:  In process, will continue to follow  Medicare Important Message Given:  Yes-second notification given Date Medicare IM Given:    Medicare IM give by:    Date Additional Medicare IM Given:    Additional Medicare Important Message give by:     If discussed at [place] of Stay Meetings, dates discussed:    Additional Comments:  Dessa Phi, RN 01/02/2015, 2:08 PM

## 2015-01-02 NOTE — Progress Notes (Signed)
PT Cancellation Note  Patient Details Name: ADHAM JOHNSON MRN: [id number] DOB: March 03, 1935   Cancelled Treatment:    Reason Eval/Treat Not Completed: Patient declined, no reason specified Pt declines mobility however requesting sleeve/light brace for R knee due to pain with mobility.     Shulamis Wenberg,KATHrine E 01/02/2015, 10:35 AM Carmelia Bake, PT, DPT 01/02/2015 Pager: [phone]

## 2015-01-02 NOTE — NC FL2 (Deleted)
[place] LEVEL OF CARE SCREENING TOOL     IDENTIFICATION  Patient Name: Kenneth Jordan Birthdate: 1936/02/16 Sex: male Admission Date (Current Location): 12/30/2014  [place] and [place] Number: [facility] and Address:  [facility],  [place] [address], [place]      Provider Number: [id number]  Attending Physician Name and Address:  Oswald Hillock, MD  Relative Name and Phone Number:       Current Level of Care: Hospital Recommended Level of Care: [place] Prior Approval Number:    Date Approved/Denied:   PASRR Number:  [id number]  Discharge Plan: SNF    Current Diagnoses: Patient Active Problem List   Diagnosis Date Noted  . UTI (urinary tract infection) 01/02/2015  . Fever in adult 12/30/2014  . Oral thrush 12/30/2014  . Acute encephalopathy 12/30/2014  . Sepsis ([place]) 12/30/2014  . Microcytic anemia 12/30/2014  . Protein calorie malnutrition ([place]) 12/25/2014  . Insomnia 12/24/2014  . 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 Rash, skin 12/10/2014  . Throat pain in adult 12/10/2014  . Hypersensitivity reaction 12/04/2014  . S/P gastrostomy tube (G tube) placement, follow-up exam 12/02/2014  . Drug-induced hypotension 12/02/2014  . Constipation due to opioid therapy 12/02/2014  . Cancer of base of tongue ([place]) 11/08/2014  . Weight loss 11/08/2014  . Hemoptysis 11/08/2014  . Dysphagia 11/08/2014    Orientation ACTIVITIES/SOCIAL BLADDER RESPIRATION    Self, Place  Active Continent Normal  BEHAVIORAL SYMPTOMS/MOOD NEUROLOGICAL BOWEL NUTRITION STATUS      Continent Feeding tube  PHYSICIAN VISITS COMMUNICATION OF NEEDS Height & Weight Skin    Verbally 5\' 9"  (175.3 cm) 166 lbs. Normal          AMBULATORY STATUS RESPIRATION    Supervision limited Normal      Personal Care Assistance Level of Assistance  Bathing, Dressing Bathing Assistance: Limited assistance   Dressing Assistance: Limited  assistance      Functional Limitations Info  Hearing, Speech   Hearing Info: Impaired Speech Info: Impaired (slurred speech due to tongue cancer)       SPECIAL CARE FACTORS FREQUENCY  PT (By licensed PT)     PT Frequency: 5 x week             Additional Factors Info  Psychotropic Code Status Info: FULL code status Allergies Info: Dilaudid, Other Psychotropic Info: Ambien         Current Medications (01/02/2015): Current Facility-Administered Medications  Medication Dose Route Frequency Provider Last Rate Last Dose  . azithromycin (ZITHROMAX) 500 mg in dextrose 5 % 250 mL IVPB  500 mg Intravenous Q24H Charlynne Cousins, MD   500 mg at 01/02/15 1350  . cefTRIAXone (ROCEPHIN) 1 g in dextrose 5 % 50 mL IVPB  1 g Intravenous Q24H Charlynne Cousins, MD   1 g at 01/02/15 1233  . feeding supplement (OSMOLITE 1.5 CAL) liquid 237 mL  237 mL Per Tube 6 X Daily Satira Anis Ward, RD   237 mL at 01/02/15 1549  . feeding supplement (PRO-STAT SUGAR FREE 64) liquid 30 mL  30 mL Per Tube BID Satira Anis Ward, RD   30 mL at 01/02/15 0931  . ferrous sulfate 300 (60 FE) MG/5ML syrup 300 mg  300 mg Oral TID WC Charlynne Cousins, MD   300 mg at 01/02/15 1233  . fluconazole (DIFLUCAN) IVPB 100 mg  100 mg Intravenous Q24H Charlynne Cousins, MD   100  mg at 01/01/15 2035  . free water 100 mL  100 mL Per Tube Q4H Satira Anis Ward, RD   100 mL at 01/02/15 1350  . heparin injection 5,000 Units  5,000 Units Subcutaneous 3 times per day Charlynne Cousins, MD   5,000 Units at 01/02/15 1541  . ketorolac (TORADOL) 15 MG/ML injection 15 mg  15 mg Intravenous Q6H PRN Charlynne Cousins, MD      . lidocaine (XYLOCAINE) 2 % viscous mouth solution 15 mL  15 mL Mouth/Throat Q3H PRN Charlynne Cousins, MD      . loratadine (CLARITIN) tablet 10 mg  10 mg Oral Daily Oswald Hillock, MD   10 mg at 01/02/15 1350  . ondansetron (ZOFRAN) tablet 4 mg  4 mg Oral Q6H PRN Charlynne Cousins, MD       Or  . ondansetron  [facility]) injection 4 mg  4 mg Intravenous Q6H PRN Charlynne Cousins, MD      . polyethylene glycol [facility] / GLYCOLAX) packet 17 g  17 g Oral Daily PRN Charlynne Cousins, MD      . sodium chloride 0.9 % injection 10-40 mL  10-40 mL Intracatheter Q12H Charlynne Cousins, MD      . sodium chloride 0.9 % injection 10-40 mL  10-40 mL Intracatheter PRN Charlynne Cousins, MD      . sucralfate (CARAFATE) 1 GM/10ML suspension 1 g  1 g Oral QID PRN Charlynne Cousins, MD      . zolpidem [facility]) tablet 5 mg  5 mg Oral QHS Charlynne Cousins, MD   5 mg at 12/30/14 2200   Do not use this list as official medication orders. Please verify with discharge summary.  Discharge Medications:   Medication List    ASK your doctor about these medications        acetaminophen 325 MG tablet  Commonly known as:  TYLENOL  Take 650 mg by mouth every 6 (six) hours as needed (For pain.).     clindamycin 1 % gel  Commonly known as:  CLINDAGEL  Apply topically 2 (two) times daily.     fluticasone 50 MCG/ACT nasal spray  Commonly known as:  FLONASE  Place 1 spray into the nose every morning.     HYDROcodone-acetaminophen 7.5-325 mg/15 ml solution  Commonly known as:  HYCET  Take 15 mLs by mouth every 6 (six) hours as needed for moderate pain.     hydrocortisone 1 % ointment  Apply 1 application topically 2 (two) times daily. 1 large tube     lidocaine 2 % solution  Commonly known as:  XYLOCAINE  Mix 1 part 2%viscous lidocaine,1part H2O.Swish and/or swallow 21mL of this mixture,48min before meals and at bedtime, up to QID     lidocaine-prilocaine cream  Commonly known as:  EMLA  Apply to affected area once     loratadine 10 MG tablet  Commonly known as:  CLARITIN  Take 10 mg by mouth daily as needed for allergies.     Menthol (Topical Analgesic) 16 % Liqd  Apply 1 application topically daily. Applies for shoulder pain.     metoCLOPramide 10 MG tablet  Commonly known as:  REGLAN  Take 1  tablet (10 mg total) by mouth 4 (four) times daily -  before meals and at bedtime.     morphine 20 MG/ML concentrated solution  Commonly known as:  ROXANOL  Place 0.5 mLs (10 mg total) into feeding tube every  2 (two) hours as needed for severe pain.     omeprazole 20 MG capsule  Commonly known as:  PRILOSEC  Take 20 mg by mouth at bedtime as needed (For heartburn or acid reflux.).     ondansetron 8 MG tablet  Commonly known as:  ZOFRAN  Take 1 tablet (8 mg total) by mouth every 8 (eight) hours as needed for nausea.     PRESCRIPTION MEDICATION  Patient receives his chemo treatments at the [facility] with Dr. Alvy Bimler. He is receiving Cetuximab every 7 days. His last dose was on 12/25/14.     prochlorperazine 10 MG tablet  Commonly known as:  COMPAZINE  Take 1 tablet (10 mg total) by mouth every 6 (six) hours as needed.     sodium fluoride 1.1 % Gel dental gel  Commonly known as:  FLUORISHIELD  Instill one drop of gel per tooth space of fluoride tray. Place over teeth for 5 minutes. Remove. Spit out excess. Repeat nightly     sucralfate 1 GM/10ML suspension  Commonly known as:  CARAFATE  Take 1 g by mouth 4 (four) times daily as needed (For sore throat.).     zolpidem 10 MG tablet  Commonly known as:  AMBIEN  Take 0.5 tablets (5 mg total) by mouth at bedtime.        Relevant Imaging Results:  Relevant Lab Results:  Recent Labs    Additional Information SSN: [id number] Pt undergoing radiation therapy,s/p 18 of planned 35 treatments. Radiation treatments are at [facility] on [facility] Chemotherapy is currently on hold due to current hospitalization for sepsis, Next week's chemotherapy was cancelled, will reassess.  Ludwig Clarks, LCSW

## 2015-01-02 NOTE — Care Management Note (Signed)
Case Management Note  Patient Details  Name: Kenneth Jordan MRN: [id number] Date of Birth: Aug 13, 1935  Subjective/Objective:  After clarification of HHC status, Kenneth Jordan rep Kenneth Jordan confirmed they are active w/HHRN. Recc HHPT/HH nurse's aide/HH Education officer, museum.  Spoke to dtr on phone-she is agreeable to [place] for [facility], will provide a Charity fundraiser duty list as resource since dtr says she works & is unable sometimes to provide the PEG feedings on time.Informed dtr of private duty care list(independent decision) Dtr voiced understanding.Formula for PEG is through the [place]. Await [place] orders.  Dtr says they can provide transp home.               Action/Plan:d/c plan home w/HHC.   Expected Discharge Date:   (UNKNOWN)               Expected Discharge Plan:  [place]  In-House Referral:  Clinical Social Work  Discharge planning Services  CM Consult  Post Acute Care Choice:    Choice offered to:  Adult Children  DME Arranged:    DME Agency:     HH Arranged:  RN, PT, Nurse's Aide, Social Work [employer] Agency:  Kenneth Jordan  Status of Service:  In process, will continue to follow  Medicare Important Message Given:  Yes-second notification given Date Medicare IM Given:    Medicare IM give by:    Date Additional Medicare IM Given:    Additional Medicare Important Message give by:     If discussed at [place] of Stay Meetings, dates discussed:    Additional Comments:  Kenneth Phi, RN 01/02/2015, 3:34 PM

## 2015-01-02 NOTE — Progress Notes (Signed)
[facility] PROGRESS NOTE  Kenneth Jordan UYQ:[id number] DOB: 1935-08-30 DOA: 12/30/2014 PCP: Pcp Not In System  Assessment/Plan: 1. Sepsis- patient was empirically started on vancomycin and Zosyn. Which was discontinued and patient then started on Rocephin and Zithromax. Chest x-ray did not shows significant abnormality. Patient's fever resolved and leukocytosis improved. Swallow evaluation has been ordered which is currently pending. Continue Rocephin and Zithromax. 2. UTI- urine culture growing Proteus mirabilis 40,000 colonies/ml, sensitive to Rocephin. Will continue Rocephin at this time. 3. Stomal mucositis/oral thrush- improving currently patient on Diflucan, oral rinses 4. Nutrition- patient has gastrostomy tube in place, getting Osmolite 237 mL 6 times daily. 5. 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 6. Protein calorie malnutrition-nutrition has seen the patient and adjusted  the tube feedings. 7. Question dysphagia- awaiting evaluation for further recommendations.  Communication: No family at bedside Disposition Plan:  Skilled nursing facility    Consultants:  None   Procedures  *None   Antibiotics:  Rocephin       Zithromax  HPI/Subjective: Kenneth Jordan is an 79 y.o. 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 This morning patient complaints. Swallow evaluation was ordered and is currently pending.  Objective: Filed Vitals:   01/02/15 0300  BP: 125/65  Pulse: 92  Temp: 98.7 F (37.1 C)  Resp: 18    Intake/Output Summary (Last 24 hours) at 01/02/15 1111 Last data filed at 01/02/15 1000  Gross per 24 hour  Intake   1621 ml  Output    151 ml  Net   1470 ml   Filed Weights   12/30/14 1839 01/01/15 0420  01/02/15 0300  Weight: 75.4 kg (166 lb 3.6 oz) 75.6 kg (166 lb 10.7 oz) 76.4 kg (168 lb 6.9 oz)    Exam:   General:  Appears in no acute distress  Cardiovascular: S1-S2 regular  Respiratory: Clear to auscultation bilaterally  Abdomen: Soft, nontender, no organomegaly  Musculoskeletal: No cyanosis/clubbing/edema of the lower extremities   Data Reviewed: Basic Metabolic Panel:  Recent Labs Lab 12/30/14 1237 12/31/14 0500  NA 132* 137  K 4.4 3.7  CL 98* 108  CO2 25 23  GLUCOSE 114* 88  BUN 21* 16  CREATININE 0.91 0.74  CALCIUM 8.5* 8.0*   Liver Function Tests:  Recent Labs Lab 12/30/14 1237 12/31/14 0500  AST 19 14*  ALT 15* 12*  ALKPHOS 51 39  BILITOT 0.6 0.9  PROT 6.6 5.4*  ALBUMIN 2.8* 2.2*   No results for input(s): LIPASE, AMYLASE in the last 168 hours. No results for input(s): AMMONIA in the last 168 hours. CBC:  Recent Labs Lab 12/30/14 1237 12/31/14 0500  WBC 16.8* 10.2  NEUTROABS 15.1*  --   HGB 10.9* 9.1*  HCT 34.7* 29.2*  MCV 79.4 80.2  PLT 357 319     CBG:  Recent Labs Lab 01/01/15 1637 01/01/15 1944 01/01/15 2346 01/02/15 0426 01/02/15 0740  GLUCAP 174* 138* 85 91 129*    Recent Results (from the past 240 hour(s))  Culture, blood (routine x 2)     Status: None (Preliminary result)   Collection Time: 12/30/14 12:40 PM  Result Value Ref Range Status   Specimen Description BLOOD LEFT ANTECUBITAL  Final   Special Requests BOTTLES DRAWN AEROBIC ONLY  5CC  Final   Culture   Final    NO GROWTH 2 DAYS Performed at [facility]    Report Status PENDING  Incomplete  Culture, blood (routine x 2)     Status: None (Preliminary result)   Collection Time: 12/30/14 12:40 PM  Result Value Ref Range Status   Specimen Description BLOOD RIGHT ANTECUBITAL  Final   Special Requests BOTTLES DRAWN AEROBIC AND ANAEROBIC 5CC  Final   Culture   Final    NO GROWTH 2 DAYS Performed at [facility]    Report Status PENDING   Incomplete  Urine culture     Status: None   Collection Time: 12/30/14  2:37 PM  Result Value Ref Range Status   Specimen Description URINE, CLEAN CATCH  Final   Special Requests NONE  Final   Culture   Final    40,000 COLONIES/ml PROTEUS MIRABILIS Performed at [facility]    Report Status 01/02/2015 FINAL  Final   Organism ID, Bacteria PROTEUS MIRABILIS  Final      Susceptibility   Proteus mirabilis - MIC*    AMPICILLIN >=32 RESISTANT Resistant     CEFAZOLIN 8 SENSITIVE Sensitive     CEFTRIAXONE <=1 SENSITIVE Sensitive     CIPROFLOXACIN <=0.25 SENSITIVE Sensitive     GENTAMICIN <=1 SENSITIVE Sensitive     IMIPENEM 8 INTERMEDIATE Intermediate     NITROFURANTOIN 128 RESISTANT Resistant     TRIMETH/SULFA 40 SENSITIVE Sensitive     AMPICILLIN/SULBACTAM 16 INTERMEDIATE Intermediate     PIP/TAZO <=4 SENSITIVE Sensitive     * 40,000 COLONIES/ml PROTEUS MIRABILIS     Studies: No results found.  Scheduled Meds: . azithromycin  500 mg Intravenous Q24H  . cefTRIAXone (ROCEPHIN)  IV  1 g Intravenous Q24H  . feeding supplement (OSMOLITE 1.5 CAL)  237 mL Per Tube 6 X Daily  . feeding supplement (PRO-STAT SUGAR FREE 64)  30 mL Per Tube BID  . ferrous sulfate  300 mg Oral TID WC  . fluconazole (DIFLUCAN) IV  100 mg Intravenous Q24H  . free water  100 mL Per Tube Q4H  . heparin  5,000 Units Subcutaneous 3 times per day  . sodium chloride  10-40 mL Intracatheter Q12H  . zolpidem  5 mg Oral QHS   Continuous Infusions:   Active Problems:   Cancer of base of tongue (HCC)   Dysphagia   Protein calorie malnutrition (HCC)   Fever in adult   Oral thrush   Acute encephalopathy   Sepsis ([place])   Microcytic anemia    Time spent: 25 min    [facility]  [facility] Pager [phone]*. If 7PM-7AM, please contact night-coverage at [email or web address], password [facility] 01/02/2015, 11:11 AM  LOS: 3 days

## 2015-01-02 NOTE — Progress Notes (Signed)
[place] [facility] Therapy Treatment Record Phone [phone]   Radiation Therapy was administered to Kenneth Jordan on: 01/02/2015  2:18 PM and was treatment # 20 out of a planned course of 35 treatments.

## 2015-01-02 NOTE — Care Management Important Message (Signed)
Important Message  Patient Details  Name: Kenneth Jordan MRN: [id number] Date of Birth: 1935-08-27   Medicare Important Message Given:  Yes-second notification given    Camillo Flaming 01/02/2015, 11:55 AMImportant Message  Patient Details  Name: Kenneth Jordan MRN: [id number] Date of Birth: 19-Sep-1935   Medicare Important Message Given:  Yes-second notification given    Camillo Flaming 01/02/2015, 11:55 AM

## 2015-01-02 NOTE — Care Management Note (Signed)
Case Management Note  Patient Details  Name: Kenneth Jordan MRN: [id number] Date of Birth: 1935-07-05  Subjective/Objective: Left vm w/dtr [name] tel#[phone], await call back-to inform of PT-recc-HHPT, Rw for choice of [facility] agency if declined for SNF.Patient having a swallow study today.CSW following also.Will confirm if [facility] able to follow.                   Action/Plan:d/c plan home.   Expected Discharge Date:   (UNKNOWN)               Expected Discharge Plan:  [place]  In-House Referral:  Clinical Social Work  Discharge planning Services  CM Consult  Post Acute Care Choice:    Choice offered to:  Adult Children  DME Arranged:    DME Agency:     HH Arranged:  RN, PT HH Agency:  [place]  Status of Service:  In process, will continue to follow  Medicare Important Message Given:  Yes-second notification given Date Medicare IM Given:    Medicare IM give by:    Date Additional Medicare IM Given:    Additional Medicare Important Message give by:     If discussed at [place] of Stay Meetings, dates discussed:    Additional Comments:  Dessa Phi, RN 01/02/2015, 1:34 PM

## 2015-01-02 NOTE — Evaluation (Signed)
Clinical/Bedside Swallow Evaluation Patient Details  Name: Kenneth Jordan MRN: [id number] Date of Birth: [birth year]  Today's Date: 01/02/2015 Time: SLP Start Time (ACUTE ONLY): 1140 SLP Stop Time (ACUTE ONLY): 1230 SLP Time Calculation (min) (ACUTE ONLY): 50 min  Past Medical History:  Past Medical History  Diagnosis Date  . 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 Hypertension   . Retinitis pigmentosa of both eyes   . Anxiety   . Depression   . GERD (gastroesophageal reflux disease)     hx of   . Arthritis     shoulders, right knee   . Insomnia 12/24/2014   Past Surgical History:  Past Surgical History  Procedure Laterality Date  . Biopsy tongue  10/24/14  . Knee surgery Right   . Tonsilectomy, adenoidectomy, bilateral myringotomy and tubes    . Feedign tube srugery     . Multiple extractions with alveoloplasty N/A 11/20/2014    Procedure: Extraction of tooth #'s 1,15,16,17,20,31 with alveoloplasty and gross debridement of remaining teeth;  Surgeon: Lenn Cal, DDS;  Location: [facility] ORS;  Service: Oral Surgery;  Laterality: N/A;   HPI:  79 yo male adm to [facility] with fever.  PMH + for dysarthria, left tongue base cancer undergoing chemorad, + thrush.  Pt CXR negative 10/31. Pt has a PEG for nutritional support but reports he eats at home.  He states he has had a "cold" recently and admits to coughing a little more with po than without.  Swallow evaluation ordered.     Assessment / Plan / Recommendation Clinical Impression  Pt presents with dysphagia secondary to his left lingual base cancer - xrt and chemo resulting in mucositis.  He had been eating prior to admission and CXR 10/31 was negative for acute event.  Pt with very limited lingual ROM resulting in dysarthria and dysphagia for which he compensates.  Multiple swallows noted across all consistencies - suspect piecemealing due to oral deficits.  Pt denies sensation of pharyngeal residuals however immediate throat  clearing, delayed coughing noted- which may indicate airway compromise.  His cough results in satisfactory clearing of voice.     Suspect his temporary exacerbation of dysphagia will improve with completion of treatment an healing of tissues.  Pt denies being hungry but states he is thirsty - SLP reviewed with pt concern for aspiration and pt confirms his desire to consume po as tolerated with known risks.  Provided him with aspiration precautions and strategies in writing and verbally to mitigate his risk.  Will follow up for tolerance, education. Pt agreeable.     Aspiration Risk  Moderate    Diet Recommendation Free water protocol after oral care;Thin (full liquids per pt wishes with known aspiration)   Medication Administration: Via alternative means Compensations: Slow rate;Small sips/bites;Multiple dry swallows after each bite/sip    Other  Recommendations Oral Care Recommendations: Oral care BID   Follow Up Recommendations    tbd   Frequency and Duration min 1 x/week  1 week   Pertinent Vitals/Pain Low grade fever, decreased      Swallow Study Prior Functional Status   been consuming milk shakes, etc at home prior to admit, states swallow ability today is consistent with recent ability     General Date of Onset: 01/02/15 Other Pertinent Information: 79 yo male adm to [facility] with fever.  PMH + for dysarthria, left tongue base cancer undergoing chemorad, + thrush.  Pt CXR negative 10/31. Pt  has a PEG for nutritional support but reports he eats at home.  He states he has had a "cold" recently and admits to coughing a little more with po than without.  Swallow evaluation ordered.   Type of Study: Bedside swallow evaluation Diet Prior to this Study: PEG tube;NPO Temperature Spikes Noted: Yes (low grade) Respiratory Status: Room air History of Recent Intubation: No Behavior/Cognition: Alert;Cooperative;Pleasant mood Oral Cavity - Dentition: Adequate natural dentition/normal for  age Self-Feeding Abilities: Able to feed self Patient Positioning: Upright in bed Baseline Vocal Quality: Normal Volitional Cough: Other (Comment) (dnt due to discomfort - reflexive cough appears strong) Volitional Swallow: Able to elicit (with effort due to xerostomia and mucositis)    Oral/Motor/Sensory Function Overall Oral Motor/Sensory Function:  (mildly asymmetry facial, rt, decreased lingual movement due to cancer and mucositis) Lingual ROM:  (deviates slightly to left upon minimal protrusion)   Ice Chips Ice chips: Impaired Oral Phase Impairments: Impaired anterior to posterior transit;Reduced lingual movement/coordination Oral Phase Functional Implications: Prolonged oral transit Pharyngeal Phase Impairments: Suspected delayed Swallow   Thin Liquid Thin Liquid: Impaired Oral Phase Impairments: Impaired anterior to posterior transit;Reduced lingual movement/coordination;Poor awareness of bolus Oral Phase Functional Implications: Prolonged oral transit Pharyngeal  Phase Impairments: Suspected delayed Swallow;Decreased hyoid-laryngeal movement;Cough - Delayed;Throat Clearing - Immediate;Multiple swallows    Nectar Thick Nectar Thick Liquid: Not tested   Honey Thick Honey Thick Liquid: Not tested   Puree Puree: Impaired Presentation: Self Fed;Spoon Oral Phase Impairments: Reduced lingual movement/coordination;Impaired anterior to posterior transit Oral Phase Functional Implications: Prolonged oral transit;Oral residue Pharyngeal Phase Impairments: Suspected delayed Swallow;Multiple swallows   Solid   GO    Solid: Not tested       Luanna Salk, [place] [facility] SLP [phone]

## 2015-01-03 ENCOUNTER — Ambulatory Visit: Payer: Self-pay

## 2015-01-03 ENCOUNTER — Ambulatory Visit
Admission: RE | Admit: 2015-01-03 | Discharge: 2015-01-03 | Disposition: A | Discharge status: Home / Self Care | Payer: Non-veteran care | Source: Ambulatory Visit | Attending: Radiation Oncology | Admitting: Radiation Oncology

## 2015-01-03 LAB — GLUCOSE, CAPILLARY
GLUCOSE-CAPILLARY: 84 mg/dL (ref 65–99)
GLUCOSE-CAPILLARY: 87 mg/dL (ref 65–99)
GLUCOSE-CAPILLARY: 89 mg/dL (ref 65–99)
Glucose-Capillary: 132 mg/dL — ABNORMAL HIGH (ref 65–99)
Glucose-Capillary: 98 mg/dL (ref 65–99)
Glucose-Capillary: 104 mg/dL — ABNORMAL HIGH (ref 65–99)

## 2015-01-03 MED ORDER — FLUCONAZOLE 100 MG PO TABS: 100.0000 mg | ORAL_TABLET | Freq: Every day | ORAL | Status: DC

## 2015-01-03 MED ORDER — CEPHALEXIN 500 MG PO CAPS: 500.0000 mg | ORAL_CAPSULE | Freq: Two times a day (BID) | ORAL | Status: DC

## 2015-01-03 MED ORDER — ENSURE ENLIVE PO LIQD
237.0000 mL | Freq: Every morning | ORAL | Status: DC
  Administered 2015-01-03: 237 mL via ORAL

## 2015-01-03 MED ORDER — FLUCONAZOLE 100 MG PO TABS: 100.0000 mg | ORAL_TABLET | ORAL | Status: DC

## 2015-01-03 MED ORDER — HEPARIN SOD (PORK) LOCK FLUSH 100 UNIT/ML IV SOLN
500.0000 [IU] | INTRAVENOUS | Status: AC | PRN
  Administered 2015-01-03: 500 [IU]

## 2015-01-03 NOTE — Progress Notes (Signed)
Attempted to call pt's daughter, [name] @ [phone] RE: pt's pending discharge and time of pick up, pt's other daughter, Mickel Baas, returned my call at [phone] and stated that her sister had called and has left after work to go out of town because she was unaware that the patient was being discharged, she stated that she lived in [place] and would not be able to get him until the morning, Maudie Mercury, CM made aware during follow up call, md notified

## 2015-01-03 NOTE — Progress Notes (Signed)
Called to [facility] by Elmyra Ricks, RT. Noted IV line leading from IV pump to power port access was snapped in two closest to needle. Stopped infusion. Patient without complaints. Removed port access. Needle intact upon removal. Inserted new power port needle per protocol. Patient tolerated well. Excellent blood return obtained. Site flushed without difficulty. Secured access needle in place per protocol and resumed infusion. Elmyra Ricks, RT informed floor nurse of these events.

## 2015-01-03 NOTE — Progress Notes (Signed)
Nutrition Follow-up  DOCUMENTATION CODES:   Not applicable  INTERVENTION:  - Continue FLD and encourage intakes throughout the day - Continue cans of Osmolite 1.5 daily, 1 can every 3 hours (0600, 0900, 1200, 1500,1800, 2100) - Continue 100 mL free water flushes Q4h - Continue 30 mL Prostat BID - Will order Ensure Enlive once/day and monitor for tolerance, this supplement provides 350 kcal and 20 grams of protein -RD will continue to monitor for needs  NUTRITION DIAGNOSIS:   Inadequate oral intake related to cancer and cancer related treatments as evidenced by other (see comment), NPO status (PEG, Tubefeed Consult). -ongoing despite diet advancement  GOAL:   Patient will meet greater than or equal to 90% of their needs -met with TF regimen  MONITOR:   PO intake, TF tolerance, Weight trends, Labs, Skin, I & O's  ASSESSMENT:   Kenneth Jordan is an 79 y.o. 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 Most of the history is obtained from the daughter relates that he has been confused since then progressively getting worse. He felt at the house the day prior to admission and cut himself above the left eyebrow. She denies that he has been complaining of any shortness of breath he's had some cough over the last 4 days, some fever at home. He is not wanted to eat, he obtains most of his feeding through his feeding tube.  11/4 Pt continues with TF regimen: 30 mL Prostat BID, 6 cans Osmolite 1.5/day, and 100 mL free water Q4h. This regimen is providing: 2333 kcal, 119 grams protein, and 1686 mL free water.  Diet advancement: 11/3 @ 9563: NPO to FLD 11/4 @ midnight: FLD to Low Na/Heart  Healthy  Pt reports he drank a little bit of apple juice this AM but no other PO intakes yet today. He states he was previously having pain to lips, tongue, and roof of mouth but that pain has now spread to include his throat. He states burning feeling with drinking apple juice and that swallowing is painful. He states that he has been coughing frequently today but that it is not associated with swallowing.   Pt denies abdominal pain or nausea at this time. He cannot recall if he was given TF boluses earlier today. Will continue current TF regimen and add Ensure once/day to assess tolerance as pt was drinking this supplement PTA.  Will adjust TF regimen/oral supplements at follow-up once PO intakes are better known. He is meeting needs with TF alone at this time.  Medications reviewed. Labs reviewed; CBGs: 79-174 mg/dL, Ca: 8 mg/dL.    11/2 - RD contacted by [facility] regarding patient and his tube feeding.  - Daughter contacted RD with questions and concerns for tube feeding rate.  - Per daughter, pt was tolerating 2 cans of Nutren in the morning and 2 cans at night. - PTA he was drinking 2 bottles of Ensure Plus to meet his additional needs. - This RD spoke with patient and RN in room. Per RN, pt had one bolus feed this morning and is about to have another feeding. - Encouraged 1 can 6 times daily. If patient is able to tolerate  this rate, may advance to 2 cans TID.  - RD will continue to monitor for ability to advance. - RN also to check with MD about speech evaluation.   Diet Order:  Diet full liquid Room service appropriate?: Yes; Fluid consistency:: Thin Diet - low sodium heart healthy Diet - low sodium heart healthy  Skin:  Reviewed, no issues  Last BM:  11/4  Height:   Ht Readings from Last 1 Encounters:  12/30/14 _0  (1.753 m)    Weight:   Wt Readings from Last 1 Encounters:  01/03/15 168 lb 14 oz (76.6 kg)    Ideal Body Weight:  72.7 kg  BMI:  Body mass  index is 24.93 kg/(m^2).  Estimated Nutritional Needs:   Kcal:  2200 - 2400 calories  Protein:  110 - 125g pro  Fluid:  2.2 - 2.4L  EDUCATION NEEDS:   No education needs identified at this time     Jarome Matin, RD, LDN Inpatient Clinical Dietitian Pager # [phone] After hours/weekend pager # [phone]

## 2015-01-03 NOTE — Progress Notes (Signed)
CM reviewed pt with assistant Cm director Spoke with pt's PM 4W RN, Ifeoma Cm spoke with pt and presented HINN 12 and answered his questions Pt reviewed sheet.  Pt called daughter [name] using hospital operator Pt asked her if she was in "[place]"  Pt discussed letter with [name] Pt handed telephone to CM while [name] was talking.  [name] informed Cm she visited pt "before going to work this morning" " I knew nothing about him being discharged"  "I called humana advocate who said no one had called to ask about him going to skilled nursing facility" CM explained to her CM did not work with pt but was delivering the letter per Eddie Dibbles asked when pt was given discharge order, "who is his doctor?" Cm informed [name] CM did not have the time the order was written but could find out for her and that the present RN states in the background Dr Darrick Meigs was pt dr.  [name] asked for the letter to be read to her but the pt reach for the telephone and told [name] that "It does not matter.  I am not signing it and we can address this in the morning or whenever y'all get here" Pt refused to sign form Cm documented this, RN witnessed. Original unsigned form with documentation placed on shadow chart and pt immediately given copy

## 2015-01-03 NOTE — Discharge Summary (Signed)
Physician Discharge Summary  Kenneth Jordan PHX:[id number] DOB: [birth year] DOA: 12/30/2014  PCP: Pcp Not In System  Admit date: 12/30/2014 Discharge date: 01/03/2015  Time spent: 25 minutes  Recommendations for Outpatient Follow-up:  1. Follow-up PCP in 2 weeks  Discharge Diagnoses:  Active Problems:   Cancer of base of tongue (HCC)   Dysphagia   Protein calorie malnutrition (HCC)   Fever in adult   Oral thrush   Acute encephalopathy   Sepsis ([place])   Microcytic anemia   UTI (urinary tract infection)   Discharge Condition: Stable  Diet recommendation: Full liquid diet  Filed Weights   01/01/15 0420 01/02/15 0300 01/03/15 0409  Weight: 75.6 kg (166 lb 10.7 oz) 76.4 kg (168 lb 6.9 oz) 76.6 kg (168 lb 14 oz)    History of present illness:  79 y.o. 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 Most of the history is obtained from the daughter relates that he has been confused since then progressively getting worse. He felt at the house the day prior to admission and cut himself above the left eyebrow. She denies that he has been complaining of any shortness of breath he's had some   Hospital Course:  1. Sepsis- patient was empirically started on vancomycin and Zosyn. Which was discontinued and patient then started on Rocephin and Zithromax. Chest x-ray did not shows significant abnormality. Patient's fever resolved and leukocytosis improved. Likely from the UTI 2. UTI- urine culture growing Proteus mirabilis 40,000 colonies/ml, sensitive to Rocephin. Patient has received 5 days of Rocephin in the hospital. Will discharge and Keflex 500 every 12 for 3 more days. 3. Stomal  mucositis/oral thrush- improving currently patient on Diflucan, oral rinses. We'll discharge on Diflucan tablet 100 mg daily for 5 more days. 4. Nutrition- patient has gastrostomy tube in place, getting Osmolite 237 mL 6 times daily. 5. 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 6. Protein calorie malnutrition-nutrition has seen the patient and adjusted the tube feedings. 7. Question dysphagia- space therapy evaluated and recommends full liquid diet.  Procedures:  None  Consultations:  None  Discharge Exam: Filed Vitals:   01/03/15 0409  BP: 110/59  Pulse: 93  Temp: 99 F (37.2 C)  Resp: 20    General: Appears in no acute distress Cardiovascular: S1-S2 regular Respiratory: Clear to auscultation bilaterally  Discharge Instructions   Discharge Instructions    Diet - low sodium heart healthy    Complete by:  As directed      Increase activity slowly    Complete by:  As directed           Current Discharge Medication List    START taking these medications   Details  cephALEXin (KEFLEX) 500 MG capsule Take 1 capsule (500 mg total) by mouth 2 (two) times daily. Qty: 6 capsule, Refills: 0    fluconazole (DIFLUCAN) 100 MG tablet Take 1 tablet (100 mg total) by mouth daily. Qty: 5 tablet, Refills: 0      CONTINUE these medications which have NOT CHANGED   Details  acetaminophen (TYLENOL) 325 MG tablet Take 650 mg by mouth every 6 (six) hours as needed (For pain.).    fluticasone (  FLONASE) 50 MCG/ACT nasal spray Place 1 spray into the nose every morning.     HYDROcodone-acetaminophen (HYCET) 7.5-325 mg/15 ml solution Take 15 mLs by mouth every 6 (six) hours as needed for moderate pain. Qty: 180 mL, Refills: 0    hydrocortisone 1 % ointment Apply 1 application topically 2 (two) times daily. 1 large tube Qty: 56 g, Refills: 0    lidocaine (XYLOCAINE) 2 % solution Mix 1 part 2%viscous lidocaine,1part H2O.Swish and/or swallow 52mL of this mixture,50min before meals  and at bedtime, up to QID Qty: 100 mL, Refills: 5    lidocaine-prilocaine (EMLA) cream Apply to affected area once Qty: 30 g, Refills: 3   Associated Diagnoses: Cancer of base of tongue (HCC)    loratadine (CLARITIN) 10 MG tablet Take 10 mg by mouth daily as needed for allergies.    Menthol, Topical Analgesic, 16 % LIQD Apply 1 application topically daily. Applies for shoulder pain.    metoCLOPramide (REGLAN) 10 MG tablet Take 1 tablet (10 mg total) by mouth 4 (four) times daily -  before meals and at bedtime. Qty: 40 tablet, Refills: 1    morphine (ROXANOL) 20 MG/ML concentrated solution Place 0.5 mLs (10 mg total) into feeding tube every 2 (two) hours as needed for severe pain. Qty: 240 mL, Refills: 0    omeprazole (PRILOSEC) 20 MG capsule Take 20 mg by mouth at bedtime as needed (For heartburn or acid reflux.).     ondansetron (ZOFRAN) 8 MG tablet Take 1 tablet (8 mg total) by mouth every 8 (eight) hours as needed for nausea. Qty: 30 tablet, Refills: 3    PRESCRIPTION MEDICATION Patient receives his chemo treatments at the [facility] with Dr. Alvy Bimler. He is receiving Cetuximab every 7 days. His last dose was on 12/25/14.    prochlorperazine (COMPAZINE) 10 MG tablet Take 1 tablet (10 mg total) by mouth every 6 (six) hours as needed. Qty: 30 tablet, Refills: 3    sodium fluoride (FLUORISHIELD) 1.1 % GEL dental gel Instill one drop of gel per tooth space of fluoride tray. Place over teeth for 5 minutes. Remove. Spit out excess. Repeat nightly Qty: 120 mL, Refills: prn    sucralfate (CARAFATE) 1 GM/10ML suspension Take 1 g by mouth 4 (four) times daily as needed (For sore throat.).    zolpidem (AMBIEN) 10 MG tablet Take 0.5 tablets (5 mg total) by mouth at bedtime. Qty: 30 tablet, Refills: 0      STOP taking these medications     clindamycin (CLINDAGEL) 1 % gel        Allergies  Allergen Reactions  . Dilaudid [Hydromorphone Hcl] Other (See  Comments)    Hallucinations  . Other Other (See Comments)    Seasonal allergies - sneezing, runny nose, watery eyes      The results of significant diagnostics from this hospitalization (including imaging, microbiology, ancillary and laboratory) are listed below for reference.    Significant Diagnostic Studies: Dg Chest 2 View  12/30/2014  CLINICAL DATA:  Mental status changes in weakness over the last 2 days. Fell yesterday. EXAM: CHEST  2 VIEW COMPARISON:  None. FINDINGS: Heart size is normal. There is atherosclerosis of the aorta. Power port in place from a right internal jugular approach has its tip in the SVC above the right atrium. The vascularity is normal. The lungs are clear. No effusions. No acute bone findings. IMPRESSION: No active cardiopulmonary disease. Electronically Signed   By: Nelson Chimes  M.D.   On: 12/30/2014 13:34   Ct Head Wo Contrast  12/30/2014  CLINICAL DATA:  Cancer of the tongue post chemotherapy most recently on 12/25/2014 has not been acting normal since [place] visit on 12/28/2014, fell on Sunday uncertain if he struck his head, worsened speech, hypertension EXAM: CT HEAD WITHOUT CONTRAST TECHNIQUE: Contiguous axial images were obtained from the base of the skull through the vertex without intravenous contrast. COMPARISON:  09/30/2014 FINDINGS: Generalized atrophy. Normal ventricular morphology. No midline shift or mass effect. Small vessel chronic ischemic changes of deep cerebral white matter. No intracranial hemorrhage, mass lesion, or acute infarction. Visualized paranasal sinuses and mastoid air cells clear. Bones unremarkable. Atherosclerotic calcification of internal carotid and vertebral arteries at skullbase. IMPRESSION: Atrophy with small vessel chronic ischemic changes of deep cerebral white matter. No acute intracranial abnormalities. Electronically Signed   By: Lavonia Dana M.D.   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 Subsequent encounter. EXAM: CT NECK WITH CONTRAST TECHNIQUE: Multidetector CT imaging of the neck was performed using the standard protocol following the bolus administration of intravenous contrast. CONTRAST:  70mL OMNIPAQUE IOHEXOL 300 MG/ML  SOLN COMPARISON:  Outside [facility] neck CT 10/04/2014 available on [facility] PACS FINDINGS: Pharynx and larynx: Regressed but not resolved mass like hyper enhancement at the left tongue base and glossal tonsillar sulcus. Residual masslike hyper enhancement is poorly marginated. There is generalized pharyngeal mucosal space edema including involvement of the epiglottis. Negative parapharyngeal spaces. Retropharyngeal course of both carotid arteries. Trace retropharyngeal effusion. Salivary glands: Abnormal left sublingual space which appears primarily due to extensive oral tongue and sublingual extension of the primary tumor. This is better demonstrated on 10/04/2014. Stable submandibular gland ; mild postradiation changes. Negative parotid glands. Thyroid: Negative. Lymph nodes: Malignant left level 2/level 3 lymphadenopathy is stable to perhaps slightly increased in size since August, with partially cystic/ necrotic nodes measuring 20-23 mm diameter individually today versus 20 mm previously. Right side cervical lymph nodes appear stable, the largest at right level IIa a measuring 8 mm short axis. Vascular: Increased effacement of the left internal jugular vein since August related to the malignant nodes, but this does remain patent. Retropharyngeal course of both common and internal carotid arteries. Major vascular structures in the neck and at the skullbase remain patent. Carotid calcified atherosclerosis. Right IJ approach porta cath partially visible.  Limited intracranial: Negative. Visualized orbits: Not included. Mastoids and visualized paranasal sinuses: Minimal left maxillary alveolar recess mucosal thickening, otherwise Visualized paranasal sinuses and mastoids are clear. Skeleton: Some interval tooth extractions. Degenerative changes in the cervical spine. Degenerative cervical spinal stenosis. No suspicious osseous lesion identified in the neck. Upper chest: No superior mediastinal lymphadenopathy. Negative lung apices. IMPRESSION: 1. Some regression of the large left tongue tumor with involvement of the sublingual space. Tumor margins now indistinct. 2. Stable to slightly increased malignant left level 2 and level 3 nodes since August. Increased effacement of the left internal jugular vein which remains patent. 3. Otherwise expected postradiation changes in the neck soft tissues. 4. Advanced cervical spine degeneration with degenerative spinal stenosis. Electronically Signed   By: Genevie Ann M.D.   On: 12/30/2014 15:47    Microbiology: Recent Results (from the past 240 hour(s))  Culture, blood (routine x  2)     Status: None (Preliminary result)   Collection Time: 12/30/14 12:40 PM  Result Value Ref Range Status   Specimen Description BLOOD LEFT ANTECUBITAL  Final   Special Requests BOTTLES DRAWN AEROBIC ONLY 5CC  Final   Culture   Final    NO GROWTH 3 DAYS Performed at [facility]    Report Status PENDING  Incomplete  Culture, blood (routine x 2)     Status: None (Preliminary result)   Collection Time: 12/30/14 12:40 PM  Result Value Ref Range Status   Specimen Description BLOOD RIGHT ANTECUBITAL  Final   Special Requests BOTTLES DRAWN AEROBIC AND ANAEROBIC 5CC  Final   Culture   Final    NO GROWTH 3 DAYS Performed at [facility]    Report Status PENDING  Incomplete  Urine culture     Status: None   Collection Time: 12/30/14  2:37 PM  Result Value Ref Range Status   Specimen Description URINE, CLEAN CATCH  Final    Special Requests NONE  Final   Culture   Final    40,000 COLONIES/ml PROTEUS MIRABILIS Performed at [facility]    Report Status 01/02/2015 FINAL  Final   Organism ID, Bacteria PROTEUS MIRABILIS  Final      Susceptibility   Proteus mirabilis - MIC*    AMPICILLIN >=32 RESISTANT Resistant     CEFAZOLIN 8 SENSITIVE Sensitive     CEFTRIAXONE <=1 SENSITIVE Sensitive     CIPROFLOXACIN <=0.25 SENSITIVE Sensitive     GENTAMICIN <=1 SENSITIVE Sensitive     IMIPENEM 8 INTERMEDIATE Intermediate     NITROFURANTOIN 128 RESISTANT Resistant     TRIMETH/SULFA 40 SENSITIVE Sensitive     AMPICILLIN/SULBACTAM 16 INTERMEDIATE Intermediate     PIP/TAZO <=4 SENSITIVE Sensitive     * 40,000 COLONIES/ml PROTEUS MIRABILIS     Labs: Basic Metabolic Panel:  Recent Labs Lab 12/30/14 1237 12/31/14 0500  NA 132* 137  K 4.4 3.7  CL 98* 108  CO2 25 23  GLUCOSE 114* 88  BUN 21* 16  CREATININE 0.91 0.74  CALCIUM 8.5* 8.0*   Liver Function Tests:  Recent Labs Lab 12/30/14 1237 12/31/14 0500  AST 19 14*  ALT 15* 12*  ALKPHOS 51 39  BILITOT 0.6 0.9  PROT 6.6 5.4*  ALBUMIN 2.8* 2.2*   No results for input(s): LIPASE, AMYLASE in the last 168 hours. No results for input(s): AMMONIA in the last 168 hours. CBC:  Recent Labs Lab 12/30/14 1237 12/31/14 0500  WBC 16.8* 10.2  NEUTROABS 15.1*  --   HGB 10.9* 9.1*  HCT 34.7* 29.2*  MCV 79.4 80.2  PLT 357 319    CBG:  Recent Labs Lab 01/02/15 1641 01/02/15 2038 01/03/15 0042 01/03/15 0405 01/03/15 0733  GLUCAP 133* 97 87 84 132*       Signed:  Saadiq Jordan S  [facility] 01/03/2015, 11:45 AM

## 2015-01-03 NOTE — Progress Notes (Signed)
[facility] Therapy Treatment Record Phone [phone]   Radiation Therapy was administered to Kenneth Jordan on: 01/03/2015  3:42 PM and was treatment # 21 out of a planned course of 35 treatments.

## 2015-01-03 NOTE — Care Management Note (Signed)
Case Management Note  Patient Details  Name: Kenneth Jordan MRN: [id number] Date of Birth: 1935/04/07  Subjective/Objective: Received a call from another dtr Kenneth Jordan I#[phone], her concerns were:Primary dtr caregiver Kenneth Jordan is overwhelmed w/caring for the patient @ home;They prefer SNF(i explained that [employer]-they will get back to our CSW if patient qualifies for auth for SNF, noted ambulated 459ft), ALF(they would like CSW to asst)VA-respite services(they would like CSW to assist). Currently active w/[facility]. If d/c plan is for home recc HHRN/PT/OT/aide/SW.Kenneth Jordan voiced understanding. CSW notified.                   Action/Plan:Monitor progress for d/c plans.   Expected Discharge Date:   (UNKNOWN)               Expected Discharge Plan:  [place]  In-House Referral:  Clinical Social Work  Discharge planning Services  CM Consult  Post Acute Care Choice:    Choice offered to:  Adult Children  DME Arranged:    DME Agency:     HH Arranged:  RN, PT, Nurse's Aide, Social Work [employer] Agency:  [facility]  Status of Service:  In process, will continue to follow  Medicare Important Message Given:  Yes-second notification given Date Medicare IM Given:    Medicare IM give by:    Date Additional Medicare IM Given:    Additional Medicare Important Message give by:     If discussed at [place] of Stay Meetings, dates discussed:    Additional Comments:  Dessa Phi, RN 01/03/2015, 10:12 AM

## 2015-01-03 NOTE — Progress Notes (Addendum)
[facility] ED CM received a call from [place] stating there was concerns with pt's daughter not wanting d/c. States she spoke with her SW English as a second language teacher who requested she contact CM. Cm reviewed Unit CM notes while speaking with her to find all interventions for CM completed Cm received a all from [facility] staff who states pt referral received and all home health interventions completed  Cm Spoke with Doreatha Lew RN (coming on) who states pt has a d/c order, she is preparing pt for d/c, daughter per pt is scheduled to arrive and pt states he is ready for discharge.   No further CM interventions needed at this time

## 2015-01-03 NOTE — Care Management Note (Signed)
Case Management Note  Patient Details  Name: Kenneth Jordan MRN: [id number] Date of Birth: 27-Jul-1935  Subjective/Objective: [facility] rep Tim aware of d/c home w/HHC, & orders.DME rep Pura Spice aware of home rw order, & d/c.                    Action/Plan:d/c home w/HHC.   Expected Discharge Date:   (UNKNOWN)               Expected Discharge Plan:  [place]  In-House Referral:  Clinical Social Work  Discharge planning Services  CM Consult  Post Acute Care Choice:    Choice offered to:  Adult Children  DME Arranged:    DME Agency:     HH Arranged:  RN, PT, Nurse's Aide, Social Work, OT [employer] Agency:  [place]  Status of Service:  Completed, signed off  Medicare Important Message Given:  Yes-second notification given Date Medicare IM Given:    Medicare IM give by:    Date Additional Medicare IM Given:    Additional Medicare Important Message give by:     If discussed at Kenneth of Stay Meetings, dates discussed:    Additional Comments:  Dessa Phi, RN 01/03/2015, 12:04 PM

## 2015-01-03 NOTE — Progress Notes (Signed)
CM noted pt not listed as d/c, spoke with Arneta Cliche RN Refer to Joelene Millin note  Cm updated pm Cm, pm SW  Pt to be d/c 01/04/15

## 2015-01-03 NOTE — Progress Notes (Signed)
Patient's daughter came after10pm and stated she here to take him home. Notified [place] Mam and she completed AVS and prescription. Discharge instructions given and explained to patient/daughter and they verbalized understanding. Patient denies any pain/distress. No wound noted. Patient did not want to take his meds, stated he will take it at home, daughter informed about it. Patient's belonging that was locked up with security was returned back to patient/daughter. Accompanied home by daughter, transported to the car by staff.

## 2015-01-03 NOTE — Progress Notes (Signed)
Physical Therapy Treatment Patient Details Name: Kenneth Jordan MRN: [id number] DOB: 09-14-1935 Today's Date: 01/03/2015    History of Present Illness Kenneth Jordan is an 79 y.o. 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 Per daughter,  he has been confused, had a fall at home 10/30and cut himself above the left eyebrow.     PT Comments    Pt participated well with session on today. Remains unsteady. Needs continued PT for strength and balance training. Pt states he plans to d/c to daughter's home. Pt states they are working on having 2nd daughter care for pt over weekend. Neither daughter present during session.  Follow Up Recommendations  Home health PT and Supervision/Assistance - 24 hour vs SNF; (depending on family's ability to provide care/supervision). Needs continued PT for strength and balance training.      Equipment Recommendations   Rolling Walker (may need RW for safety. States he has a cane at home. Asked pt to have daughter bring cane in to see if we can put a new tip on the bottom)    Recommendations for Other Services       Precautions / Restrictions Precautions Precautions: Fall Precaution Comments: PEG Restrictions Weight Bearing Restrictions: No    Mobility  Bed Mobility Overal bed mobility: Modified Independent                Transfers Overall transfer level: Needs assistance   Transfers: Sit to/from Stand Sit to Stand: Supervision            Ambulation/Gait Ambulation/Gait assistance: Min guard;Min assist Ambulation Distance (Feet): 450 Feet Assistive device: None Gait Pattern/deviations: Drifts right/left;Staggering left;Staggering right     General Gait Details: min intermittent assist to steady. pt tolerated  distance well. c/o R knee pain-chronic   Stairs            Wheelchair Mobility    Modified Rankin (Stroke Patients Only)       Balance                                    Cognition Arousal/Alertness: Awake/alert Behavior During Therapy: WFL for tasks assessed/performed Overall Cognitive Status: Within Functional Limits for tasks assessed                      Exercises General Exercises - Lower Extremity Ankle Circles/Pumps: AROM;Both;10 reps;Standing Hip ABduction/ADduction: AROM;Both;10 reps;Standing Hip Flexion/Marching: AROM;Both;10 reps;Standing    General Comments        Pertinent Vitals/Pain Pain Assessment: Faces Faces Pain Scale: Hurts little more Pain Location: R knee with activity-chronic Pain Descriptors / Indicators: Sore;Aching Pain Intervention(s): Monitored during session;Repositioned    Home Living                      Prior Function            PT Goals (current goals can now be found in the care plan section) Progress towards PT goals: Progressing toward goals    Frequency  Min 3X/week    PT Plan Current plan remains appropriate    Co-evaluation  End of Session Equipment Utilized During Treatment: Gait belt Activity Tolerance: Patient tolerated treatment well Patient left: in bed;with call bell/phone within reach;with bed alarm set     Time: 0240-9735 PT Time Calculation (min) (ACUTE ONLY): 19 min  Charges:  $Gait Training: 8-22 mins                    G Codes:      Weston Anna, MPT Pager: [phone]

## 2015-01-04 ENCOUNTER — Ambulatory Visit: Payer: Self-pay

## 2015-01-04 LAB — CULTURE, BLOOD (ROUTINE X 2)
CULTURE: NO GROWTH
Culture: NO GROWTH

## 2015-01-06 ENCOUNTER — Ambulatory Visit: Payer: Self-pay

## 2015-01-06 ENCOUNTER — Ambulatory Visit
Admission: RE | Admit: 2015-01-06 | Discharge: 2015-01-06 | Disposition: A | Discharge status: Home / Self Care | Payer: Non-veteran care | Source: Ambulatory Visit | Attending: Radiation Oncology | Admitting: Radiation Oncology

## 2015-01-06 ENCOUNTER — Encounter: Payer: Self-pay | Admitting: *Deleted

## 2015-01-06 ENCOUNTER — Other Ambulatory Visit: Payer: Self-pay | Admitting: Hematology and Oncology

## 2015-01-06 ENCOUNTER — Telehealth: Payer: Self-pay | Admitting: *Deleted

## 2015-01-06 ENCOUNTER — Ambulatory Visit (HOSPITAL_BASED_OUTPATIENT_CLINIC_OR_DEPARTMENT_OTHER): Payer: Medicare PPO

## 2015-01-06 ENCOUNTER — Other Ambulatory Visit: Payer: Self-pay

## 2015-01-06 ENCOUNTER — Encounter: Payer: Self-pay | Admitting: Radiation Oncology

## 2015-01-06 ENCOUNTER — Telehealth: Payer: Self-pay | Admitting: Hematology and Oncology

## 2015-01-06 VITALS — BP 116/67 | HR 99 | Temp 99.0°F | Ht 69.0 in | Wt 168.5 lb

## 2015-01-06 DIAGNOSIS — C01 Malignant neoplasm of base of tongue: Secondary | ICD-10-CM | POA: Diagnosis not present

## 2015-01-06 DIAGNOSIS — Z510 Encounter for antineoplastic radiation therapy: Secondary | ICD-10-CM | POA: Diagnosis not present

## 2015-01-06 LAB — COMPREHENSIVE METABOLIC PANEL (CC13)
ALT: 31 U/L (ref 0–55)
AST: 23 U/L (ref 5–34)
Albumin: 2.4 g/dL — ABNORMAL LOW (ref 3.5–5.0)
Alkaline Phosphatase: 64 U/L (ref 40–150)
Anion Gap: 9 mEq/L (ref 3–11)
BUN: 23.1 mg/dL (ref 7.0–26.0)
CO2: 28 mEq/L (ref 22–29)
Calcium: 9.6 mg/dL (ref 8.4–10.4)
Chloride: 101 mEq/L (ref 98–109)
Creatinine: 0.8 mg/dL (ref 0.7–1.3)
EGFR: 85 mL/min/{1.73_m2} — ABNORMAL LOW (ref >90)
Glucose: 94 mg/dl (ref 70–140)
Potassium: 4.9 mEq/L (ref 3.5–5.1)
Sodium: 138 mEq/L (ref 136–145)
Total Bilirubin: <0.3 mg/dL (ref 0.20–1.20)
Total Protein: 6.6 g/dL (ref 6.4–8.3)

## 2015-01-06 LAB — CBC WITH DIFFERENTIAL/PLATELET
BASO%: 0.4 % (ref 0.0–2.0)
Basophils Absolute: 0 10*3/uL (ref 0.0–0.1)
EOS%: 3.1 % (ref 0.0–7.0)
Eosinophils Absolute: 0.3 10*3/uL (ref 0.0–0.5)
HCT: 36.2 % — ABNORMAL LOW (ref 38.4–49.9)
HGB: 11.5 g/dL — ABNORMAL LOW (ref 13.0–17.1)
LYMPH%: 11.8 % — ABNORMAL LOW (ref 14.0–49.0)
MCH: 24.2 pg — ABNORMAL LOW (ref 27.2–33.4)
MCHC: 31.7 g/dL — ABNORMAL LOW (ref 32.0–36.0)
MCV: 76.4 fL — ABNORMAL LOW (ref 79.3–98.0)
MONO#: 0.9 10*3/uL (ref 0.1–0.9)
MONO%: 10.8 % (ref 0.0–14.0)
NEUT#: 6.4 10*3/uL (ref 1.5–6.5)
NEUT%: 73.9 % (ref 39.0–75.0)
Platelets: 535 10*3/uL — ABNORMAL HIGH (ref 140–400)
RBC: 4.73 10*6/uL (ref 4.20–5.82)
RDW: 15.5 % — ABNORMAL HIGH (ref 11.0–14.6)
WBC: 8.6 10*3/uL (ref 4.0–10.3)
lymph#: 1 10*3/uL (ref 0.9–3.3)

## 2015-01-06 LAB — MAGNESIUM (CC13): Magnesium: 1.9 mg/dl (ref 1.5–2.5)

## 2015-01-06 NOTE — Care Management Note (Signed)
Case Management Note  Patient Details  Name: Kenneth Jordan MRN: [id number] Date of Birth: 10-Oct-1935  Subjective/Objective: entry for 01/03/15-Received a call on last Friday 01/03/15 @ 4:51p from [facility] rep-asking about the d/c plan stating"this was an unsafe d/c plan" since she had received a call from the daughter. I informed her that we had discussed all d/c options w/dtr & even though she felt that her dad needed to go SNF, she did understand from the social worker that Home w/HHC was the plan that the patient met the requirements based on PT eval, & recommendations.  Provided her w/soical worker tel#.                    Action/Plan:   Expected Discharge Date:   (UNKNOWN)               Expected Discharge Plan:  [place]  In-House Referral:  Clinical Social Work  Discharge planning Services  CM Consult  Post Acute Care Choice:    Choice offered to:  Adult Children  DME Arranged:    DME Agency:     HH Arranged:  RN, PT, Nurse's Aide, Social Work, OT [employer] Agency:  [place]  Status of Service:  Completed, signed off  Medicare Important Message Given:  Yes-second notification given Date Medicare IM Given:    Medicare IM give by:    Date Additional Medicare IM Given:    Additional Medicare Important Message give by:     If discussed at [place] of Stay Meetings, dates discussed:    Additional Comments:  Dessa Phi, RN 01/06/2015, 9:12 AM

## 2015-01-06 NOTE — Progress Notes (Signed)
Weekly Management Note:  Outpatient    ICD-9-CM ICD-10-CM   1. Cancer of base of tongue (HCC) 141.0 C01 0.9 %  sodium chloride infusion    Current Dose:  42Gy  Projected Dose: 70 Gy   Narrative:  The patient presents for routine under treatment assessment.  CBCT/MVCT images/Port film x-rays were reviewed.  The chart was checked.   Mr. Arya is here prior to his 22st fraction of radiation to his tongue and bilateral neck. He was just discharged on 11/4 from the hospital for pneumonia, thrush, and UTI. He is taking an antibiotic, and anti-fungal medicine at this time. He pain is a 6/10 in his mouth when he swallows. He took liquid morphine for pain today, for the first time. He takes Nutren 6 cans a day through his feeding tube with water flushes in between feedings.    His daughter states they were to send him home with Home Health assistance, but insurance will not pay, so he has not received any help at this time. The [place] social worker is attempting to arrange this for him currently. He is weak, tired, and having trouble with completing his normal daily activities without much help and encouragement from his daughters.  He feels depressed.  He has no thoughts of harming himself or others.   Sinus pressure, congestion. No local ENT    Orthostatics: BP sitting 116/67. Pulse 99 and BP standing 90/57, pulse 106.   Wt Readings from Last 3 Encounters:  01/06/15 168 lb 8 oz (76.431 kg)  01/03/15 168 lb 14 oz (76.6 kg)  12/27/14 173 lb 3.2 oz (78.563 kg)    Physical Findings:  Wt Readings from Last 3 Encounters:  01/06/15 168 lb 8 oz (76.431 kg)  01/03/15 168 lb 14 oz (76.6 kg)  12/27/14 173 lb 3.2 oz (78.563 kg)    height is 5\' 9"  (1.753 m) and weight is 168 lb 8 oz (76.431 kg). His temperature is 99 F (37.2 C). His blood pressure is 116/67 and his pulse is 99.   Ambulatory. Patchy mucositis and erythema over his oral cavity . Mucus membranes are moist, no thrush. Erythematous rash  over the neck and face improved. Skin dry..  CBC    Component Value Date/Time   WBC 8.6 01/06/2015 1450   WBC 10.2 12/31/2014 0500   RBC 4.73 01/06/2015 1450   RBC 3.64* 12/31/2014 0500   HGB 11.5* 01/06/2015 1450   HGB 9.1* 12/31/2014 0500   HCT 36.2* 01/06/2015 1450   HCT 29.2* 12/31/2014 0500   PLT 535* 01/06/2015 1450   PLT 319 12/31/2014 0500   MCV 76.4* 01/06/2015 1450   MCV 80.2 12/31/2014 0500   MCH 24.2* 01/06/2015 1450   MCH 25.0* 12/31/2014 0500   MCHC 31.7* 01/06/2015 1450   MCHC 31.2 12/31/2014 0500   RDW 15.5* 01/06/2015 1450   RDW 14.9 12/31/2014 0500   LYMPHSABS 1.0 01/06/2015 1450   LYMPHSABS 0.7 12/30/2014 1237   MONOABS 0.9 01/06/2015 1450   MONOABS 0.9 12/30/2014 1237   EOSABS 0.3 01/06/2015 1450   EOSABS 0.0 12/30/2014 1237   BASOSABS 0.0 01/06/2015 1450   BASOSABS 0.0 12/30/2014 1237     CMP     Component Value Date/Time   NA 138 01/06/2015 1450   NA 137 12/31/2014 0500   K 4.9 01/06/2015 1450   K 3.7 12/31/2014 0500   CL 108 12/31/2014 0500   CO2 28 01/06/2015 1450   CO2 23 12/31/2014 0500  GLUCOSE 94 01/06/2015 1450   GLUCOSE 88 12/31/2014 0500   BUN 23.1 01/06/2015 1450   BUN 16 12/31/2014 0500   CREATININE 0.8 01/06/2015 1450   CREATININE 0.74 12/31/2014 0500   CALCIUM 9.6 01/06/2015 1450   CALCIUM 8.0* 12/31/2014 0500   PROT 6.6 01/06/2015 1450   PROT 5.4* 12/31/2014 0500   ALBUMIN 2.4* 01/06/2015 1450   ALBUMIN 2.2* 12/31/2014 0500   AST 23 01/06/2015 1450   AST 14* 12/31/2014 0500   ALT 31 01/06/2015 1450   ALT 12* 12/31/2014 0500   ALKPHOS 64 01/06/2015 1450   ALKPHOS 39 12/31/2014 0500   BILITOT <0.30 01/06/2015 1450   BILITOT 0.9 12/31/2014 0500   GFRNONAA >60 12/31/2014 0500   GFRAA >60 12/31/2014 0500   Impression:  The patient is tolerating radiotherapy. Dehydration, weight loss  Plan:  Continue radiotherapy as planned. Supportive listening and emotional support provided. Alert social work - depression. IV  fluids tomorrow. Alert Nutritionist of weight loss. Discussed medication management, pain control with morphine. Sees Dr. Alvy Bimler tomorrow.  Discuss if antihistamine are ok for congestion.  Otherwise consider Netti pot or Call Dr Conley Canal at [facility] -----------------------------------  Eppie Gibson, MD

## 2015-01-06 NOTE — Progress Notes (Signed)
Kenneth Jordan is here for his 21st fraction of radiation to his tongue and bilateral neck. He was just discharged on 11/4 from the hospital for pneumonia, thrush, and UTI. He is taking an antibiotic, and anti-fungal medicine at this time. He pain is a 6/10 in his mouth when he swallows. He took liquid morphine for pain today, for the first time. He takes Nutren 6 cans a day through his feeding tube with water flushes in between feedings. His feeding tube is without a bandage, and some yellow drainage is noted around the opening. He reports he is tired and rests a lot of the day. His daughter mentions is breathing is labored while he is sleeping.  His daughter states they were to send him home with Home Health assistance, but insurance will not pay, so he has not received any help at this time. The [place] social worker is attempting to arrange this for him currently. He is weak, tired, and having trouble with completing his normal daily activities without much help and encouragement from his daughters.   BP 116/67 mmHg  Pulse 99  Temp(Src) 99 F (37.2 C)  Ht 5\' 9"  (1.753 m)  Wt 168 lb 8 oz (76.431 kg)  BMI 24.87 kg/m2  Orthostatics: BP sitting 116/67. Pulse 99 and BP standing 90/57, pulse 106.   Wt Readings from Last 3 Encounters:  01/06/15 168 lb 8 oz (76.431 kg)  01/03/15 168 lb 14 oz (76.6 kg)  12/27/14 173 lb 3.2 oz (78.563 kg)

## 2015-01-06 NOTE — Telephone Encounter (Signed)
  Oncology Nurse Navigator Documentation   Navigator Encounter Type: Treatment (01/06/15 1225) Patient Visit Type: AVWUJW (01/06/15 1225)       Interventions: Coordination of Care (01/06/15 1225)     Spoke with patient's dtr [name] to confirm her understanding of this afternoon's 3:00 appt with Dr. Isidore Moos and 3:40 Tomo.  I later called her to confirm 2:15 lab appt rescheduled from 11:30 this morning.  She verbalized understanding.  [name], RN, BSN, [facility] [phone]           Time Spent with Patient: 15 (01/06/15 1225)

## 2015-01-06 NOTE — Telephone Encounter (Signed)
lvm for pt regarding to 11.7 and 11.8 appt.Marland KitchenMarland KitchenMarland Kitchen

## 2015-01-06 NOTE — Telephone Encounter (Signed)
Called and spoke with Kenneth Jordan, daughter of [name].  He was released from the hospital on 01/03/15.  His Daughter reports he fell on 01/01/15 resulting in a cut on his hand and forehead.  Upon admission he was diagnosed with weakness, pneumonia, fever and thrush and currently has a UTI.  She is concerned about him coming for treatment today and him having Erbitux this week per Medical Oncology.  As requested by Dr. Isidore Moos, called and left a message for the symptom management NP, Retta Mac for assessment today with update on status.  Request call to Dr. Isidore Moos when he arrives in Medical Oncology.

## 2015-01-07 ENCOUNTER — Encounter: Payer: Self-pay | Admitting: Hematology and Oncology

## 2015-01-07 ENCOUNTER — Ambulatory Visit (HOSPITAL_BASED_OUTPATIENT_CLINIC_OR_DEPARTMENT_OTHER): Payer: Non-veteran care

## 2015-01-07 ENCOUNTER — Ambulatory Visit: Payer: Self-pay

## 2015-01-07 ENCOUNTER — Ambulatory Visit
Admission: RE | Admit: 2015-01-07 | Discharge: 2015-01-07 | Disposition: A | Discharge status: Home / Self Care | Payer: Non-veteran care | Source: Ambulatory Visit | Attending: Radiation Oncology | Admitting: Radiation Oncology

## 2015-01-07 ENCOUNTER — Telehealth: Payer: Self-pay | Admitting: *Deleted

## 2015-01-07 ENCOUNTER — Ambulatory Visit (HOSPITAL_BASED_OUTPATIENT_CLINIC_OR_DEPARTMENT_OTHER): Payer: Non-veteran care | Admitting: Hematology and Oncology

## 2015-01-07 ENCOUNTER — Other Ambulatory Visit: Payer: Self-pay

## 2015-01-07 DIAGNOSIS — F330 Major depressive disorder, recurrent, mild: Secondary | ICD-10-CM | POA: Insufficient documentation

## 2015-01-07 DIAGNOSIS — K1230 Oral mucositis (ulcerative), unspecified: Secondary | ICD-10-CM

## 2015-01-07 DIAGNOSIS — E46 Unspecified protein-calorie malnutrition: Secondary | ICD-10-CM

## 2015-01-07 DIAGNOSIS — R21 Rash and other nonspecific skin eruption: Secondary | ICD-10-CM | POA: Diagnosis not present

## 2015-01-07 DIAGNOSIS — R070 Pain in throat: Secondary | ICD-10-CM

## 2015-01-07 DIAGNOSIS — Z510 Encounter for antineoplastic radiation therapy: Secondary | ICD-10-CM | POA: Diagnosis not present

## 2015-01-07 DIAGNOSIS — C01 Malignant neoplasm of base of tongue: Secondary | ICD-10-CM | POA: Diagnosis not present

## 2015-01-07 DIAGNOSIS — J3489 Other specified disorders of nose and nasal sinuses: Secondary | ICD-10-CM | POA: Diagnosis not present

## 2015-01-07 DIAGNOSIS — K9281 Gastrointestinal mucositis (ulcerative): Secondary | ICD-10-CM

## 2015-01-07 DIAGNOSIS — R0981 Nasal congestion: Secondary | ICD-10-CM | POA: Insufficient documentation

## 2015-01-07 DIAGNOSIS — K9429 Other complications of gastrostomy: Secondary | ICD-10-CM

## 2015-01-07 MED ORDER — HYDROMORPHONE HCL 4 MG/ML IJ SOLN
INTRAMUSCULAR | Status: AC
  Filled 2015-01-07: qty 1

## 2015-01-07 MED ORDER — HYDROMORPHONE HCL 4 MG/ML IJ SOLN: 1.0000 mg | INTRAMUSCULAR | Status: DC | PRN

## 2015-01-07 MED ORDER — SODIUM CHLORIDE 0.9 % IV SOLN
Freq: Once | INTRAVENOUS | Status: AC
  Administered 2015-01-07: 13:00:00 via INTRAVENOUS

## 2015-01-07 NOTE — Assessment & Plan Note (Signed)
He has cetuximab-induced skin rash. There is no open sores or ulceration of the skin. I will stop cetuximab as above. Continue conservative management

## 2015-01-07 NOTE — Assessment & Plan Note (Signed)
The patient is completing a course of antibiotics.  I do not believe he has ongoing bacterial infection. I recommend he continue some loratadine and Nasacort for symptom control.  there is no contraindication for him to use decongestions with Sudofed containing products

## 2015-01-07 NOTE — Assessment & Plan Note (Signed)
I have a long discussion with the patient's daughter. His performance status continued to decline and the patient has verbalized to family members to he would not want to continue on systemic treatment.  I will continue to provide supportive care only throughout the remaining of his radiation treatment.

## 2015-01-07 NOTE — Assessment & Plan Note (Signed)
The patient have recurrent depression but no suicidal ideation. In the scope of things, I would not recommend prescribing any additional medications for now. As mentioned above, I will discontinue systemic treatment.

## 2015-01-07 NOTE — Assessment & Plan Note (Signed)
He has mucositis on his tongue, likely due to treatment, despite significant doses adjustment. I will discontinue cetuximab as above. He rarely used the prescription of morphine sulfate does not like to take it because he does not like to be sedated. Continue conservative management

## 2015-01-07 NOTE — Assessment & Plan Note (Signed)
He had significant protein calorie malnutrition. According to his daughter, he was able to tolerate 6 cans of nutritional supplement per day. We will continue to maintain nutrition through his feeding tube and I will provide IV fluid support daily.

## 2015-01-07 NOTE — Progress Notes (Signed)
[place] OFFICE PROGRESS NOTE  Patient Care Team: Pcp Not In System as PCP - General Leota Sauers, RN as Oncology Nurse Navigator Heath Lark, MD as Consulting Physician (Hematology and Oncology) Eppie Gibson, MD as Attending Physician (Radiation Oncology) Karie Mainland, RD as Dietitian (Nutrition)  SUMMARY OF ONCOLOGIC HISTORY: Oncology History   Cancer of base of tongue   Staging form: Lip and Oral Cavity, AJCC 7th Edition     Clinical stage from 11/08/2014: Stage IVA (T4a, N2c, M0) - Signed by Heath Lark, MD on 11/08/2014       Cancer of base of tongue ([place])   07/08/2014 Miscellaneous  the patient noted tongue swelling / mass   10/04/2014 Imaging  CT scan done at the [facility] show large tongue mass with bilateral neck lymphadenopathy   10/07/2014 Miscellaneous  he was seen at the [facility] for evaluation was noted to have tongue cancer   10/10/2014 Imaging  PET CT scan from the [facility] showed extensive regional lymphadenopathy on the left side including supraclavicular lymph node involvement and possibly right jugulodigastric lymph node involvement.   10/24/2014 Procedure  he underwent laryngoscopy and biopsy. there is a large ulcerative tongue a mass on the left involving the tonsil, soft palate, epiglottis.   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 Treatment was stopped due to decline in performance status and various side-effects   12/04/2014 -  Radiation Therapy he received radiation treatment   12/04/2014 Adverse Reaction he had infusion reaction from Erbitux   12/17/2014 Adverse Reaction He is noted to have worsening skin rash and oral mucositis related to side effects of Erbitux. The dose of Erbitux is reduced by 50%   12/30/2014  - 01/03/2015 Hospital Admission He was admitted to the hospital for altered mental status and sepsis. He received antibiotics for UTI and fluconazole for thrush    INTERVAL HISTORY: Please see below for problem oriented charting.. The patient is seen at the infusion area. He is sleeping throughout the conversation but I was able to talk to his daughter. His daughter stated the patient has been very depressed but no suicidal ideation was indicated. The patient had verbalized and indicated he does not want further systemic treatment but once a complete radiation treatment. He is able to tolerate tube feeding well. There were no reported nausea or vomiting. His oral pain appears to be well controlled with ibuprofen. The patient declined using liquid morphine because it caused excessive sedation. There were a lot of issues about support at home and his other daughter was not able to continue to provide aggressive care at home. Due to his insurance issue, he does not have coverage for home health in [place]. The patient has been complaining of nasal drainage and congestion. There were no reported fevers or chills.  REVIEW OF SYSTEMS:   Constitutional: Denies fevers, chills or abnormal weight loss Eyes: Denies blurriness of vision Respiratory: Denies cough, dyspnea or wheezes Cardiovascular: Denies palpitation, chest discomfort or lower extremity swelling Gastrointestinal:  Denies nausea, heartburn or change in bowel habits Skin: Denies abnormal skin rashes Lymphatics: Denies new lymphadenopathy or easy bruising Neurological:Denies numbness, tingling or new weaknesses Behavioral/Psych: Mood is stable, no new changes  All other systems were reviewed with the patient  and are negative.  I have reviewed the past medical history, past surgical history, social history and family history with the patient and they are unchanged from previous note.  ALLERGIES:  is allergic to dilaudid and  other.  MEDICATIONS:  Current Outpatient Prescriptions  Medication Sig Dispense Refill  . acetaminophen (TYLENOL) 325 MG tablet Take 650 mg by mouth every 6 (six) hours as needed (For pain.).    Marland Kitchen cephALEXin (KEFLEX) 500 MG capsule Take 1 capsule (500 mg total) by mouth 2 (two) times daily. 6 capsule 0  . fluconazole (DIFLUCAN) 100 MG tablet Take 1 tablet (100 mg total) by mouth daily. 5 tablet 0  . fluticasone (FLONASE) 50 MCG/ACT nasal spray Place 1 spray into the nose every morning.     Marland Kitchen HYDROcodone-acetaminophen (HYCET) 7.5-325 mg/15 ml solution Take 15 mLs by mouth every 6 (six) hours as needed for moderate pain. 180 mL 0  . hydrocortisone 1 % ointment Apply 1 application topically 2 (two) times daily. 1 large tube 56 g 0  . lidocaine (XYLOCAINE) 2 % solution Mix 1 part 2%viscous lidocaine,1part H2O.Swish and/or swallow 75mL of this mixture,74min before meals and at bedtime, up to QID 100 mL 5  . loratadine (CLARITIN) 10 MG tablet Take 10 mg by mouth daily as needed for allergies.    . Menthol, Topical Analgesic, 16 % LIQD Apply 1 application topically daily. Applies for shoulder pain.    Marland Kitchen metoCLOPramide (REGLAN) 10 MG tablet Take 1 tablet (10 mg total) by mouth 4 (four) times daily -  before meals and at bedtime. 40 tablet 1  . morphine (ROXANOL) 20 MG/ML concentrated solution Place 0.5 mLs (10 mg total) into feeding tube every 2 (two) hours as needed for severe pain. 240 mL 0  . omeprazole (PRILOSEC) 20 MG capsule Take 20 mg by mouth at bedtime as needed (For heartburn or acid reflux.).     Marland Kitchen ondansetron (ZOFRAN) 8 MG tablet Take 1 tablet (8 mg total) by mouth every 8 (eight) hours as needed for nausea. 30 tablet 3  . PRESCRIPTION MEDICATION Patient receives his chemo treatments at the [facility] with Dr. Alvy Bimler. He is receiving Cetuximab every 7 days. His last dose was on 12/25/14.    Marland Kitchen prochlorperazine (COMPAZINE) 10 MG tablet Take 1 tablet (10 mg total)  by mouth every 6 (six) hours as needed. (Patient taking differently: Take 10 mg by mouth every 6 (six) hours as needed for nausea or vomiting. ) 30 tablet 3  . sodium fluoride (FLUORISHIELD) 1.1 % GEL dental gel Instill one drop of gel per tooth space of fluoride tray. Place over teeth for 5 minutes. Remove. Spit out excess. Repeat nightly 120 mL prn  . sucralfate (CARAFATE) 1 GM/10ML suspension Take 1 g by mouth 4 (four) times daily as needed (For sore throat.).    Marland Kitchen zolpidem (AMBIEN) 10 MG tablet Take 0.5 tablets (5 mg total) by mouth at bedtime. 30 tablet 0   No current facility-administered medications for this visit.    PHYSICAL EXAMINATION: ECOG PERFORMANCE STATUS: 2 - Symptomatic, <50% confined to bed GENERAL:alert, no distress and comfortable. He appears comfortable and sleeping SKIN:  He has small red skin rash around his face and neck but without open sores OROPHARYNX: his lips not normal and part of his tongue were visualized without blisters or thrush Musculoskeletal:no cyanosis of digits and no clubbing  NEURO:  Not alert and sleeping LABORATORY DATA:  I have reviewed the  data as listed    Component Value Date/Time   NA 138 01/06/2015 1450   NA 137 12/31/2014 0500   K 4.9 01/06/2015 1450   K 3.7 12/31/2014 0500   CL 108 12/31/2014 0500   CO2 28 01/06/2015 1450   CO2 23 12/31/2014 0500   GLUCOSE 94 01/06/2015 1450   GLUCOSE 88 12/31/2014 0500   BUN 23.1 01/06/2015 1450   BUN 16 12/31/2014 0500   CREATININE 0.8 01/06/2015 1450   CREATININE 0.74 12/31/2014 0500   CALCIUM 9.6 01/06/2015 1450   CALCIUM 8.0* 12/31/2014 0500   PROT 6.6 01/06/2015 1450   PROT 5.4* 12/31/2014 0500   ALBUMIN 2.4* 01/06/2015 1450   ALBUMIN 2.2* 12/31/2014 0500   AST 23 01/06/2015 1450   AST 14* 12/31/2014 0500   ALT 31 01/06/2015 1450   ALT 12* 12/31/2014 0500   ALKPHOS 64 01/06/2015 1450   ALKPHOS 39 12/31/2014 0500   BILITOT <0.30 01/06/2015 1450   BILITOT 0.9 12/31/2014 0500    GFRNONAA >60 12/31/2014 0500   GFRAA >60 12/31/2014 0500    No results found for: SPEP, UPEP  Lab Results  Component Value Date   WBC 8.6 01/06/2015   NEUTROABS 6.4 01/06/2015   HGB 11.5* 01/06/2015   HCT 36.2* 01/06/2015   MCV 76.4* 01/06/2015   PLT 535* 01/06/2015      Chemistry      Component Value Date/Time   NA 138 01/06/2015 1450   NA 137 12/31/2014 0500   K 4.9 01/06/2015 1450   K 3.7 12/31/2014 0500   CL 108 12/31/2014 0500   CO2 28 01/06/2015 1450   CO2 23 12/31/2014 0500   BUN 23.1 01/06/2015 1450   BUN 16 12/31/2014 0500   CREATININE 0.8 01/06/2015 1450   CREATININE 0.74 12/31/2014 0500      Component Value Date/Time   CALCIUM 9.6 01/06/2015 1450   CALCIUM 8.0* 12/31/2014 0500   ALKPHOS 64 01/06/2015 1450   ALKPHOS 39 12/31/2014 0500   AST 23 01/06/2015 1450   AST 14* 12/31/2014 0500   ALT 31 01/06/2015 1450   ALT 12* 12/31/2014 0500   BILITOT <0.30 01/06/2015 1450   BILITOT 0.9 12/31/2014 0500     ASSESSMENT & PLAN:  Cancer of base of tongue ([place])  I have a long discussion with the patient's daughter. His performance status continued to decline and the patient has verbalized to family members to he would not want to continue on systemic treatment.  I will continue to provide supportive care only throughout the remaining of his radiation treatment.  Protein calorie malnutrition ([place])  He had significant protein calorie malnutrition. According to his daughter, he was able to tolerate 6 cans of nutritional supplement per day. We will continue to maintain nutrition through his feeding tube and I will provide IV fluid support daily.  Nasal congestion with rhinorrhea  The patient is completing a course of antibiotics.  I do not believe he has ongoing bacterial infection. I recommend he continue some loratadine and Nasacort for symptom control.  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 I will discontinue cetuximab as above. He rarely used the prescription of morphine sulfate does not like to take it because he does not like to be sedated. Continue conservative management  Rash, skin He has cetuximab-induced skin rash. There is no open sores  or ulceration of the skin. I will stop cetuximab as above. Continue conservative management  Depression, major, recurrent, mild ([place])  The patient have recurrent depression but no suicidal ideation. In the scope of things, I would not recommend prescribing any additional medications for now. As mentioned above, I will discontinue systemic treatment.   No orders of the defined types were placed in this encounter.   All questions were answered. The patient knows to call the clinic with any problems, questions or concerns. No barriers to learning was detected. I spent 25 minutes counseling the patient's family and seeing the patient face to face. The total time spent in the appointment was 40 minutes and more than 50% was on counseling and review of test results     [facility], [place], MD 01/07/2015 3:05 PM

## 2015-01-08 ENCOUNTER — Ambulatory Visit
Admission: RE | Admit: 2015-01-08 | Discharge: 2015-01-08 | Disposition: A | Discharge status: Home / Self Care | Payer: Non-veteran care | Source: Ambulatory Visit | Attending: Radiation Oncology | Admitting: Radiation Oncology

## 2015-01-08 ENCOUNTER — Telehealth: Payer: Self-pay | Admitting: *Deleted

## 2015-01-08 ENCOUNTER — Ambulatory Visit (HOSPITAL_BASED_OUTPATIENT_CLINIC_OR_DEPARTMENT_OTHER): Payer: Non-veteran care

## 2015-01-08 ENCOUNTER — Ambulatory Visit: Payer: Non-veteran care | Admitting: Nutrition

## 2015-01-08 VITALS — BP 130/65 | HR 94 | Temp 98.9°F

## 2015-01-08 DIAGNOSIS — C01 Malignant neoplasm of base of tongue: Secondary | ICD-10-CM | POA: Diagnosis not present

## 2015-01-08 DIAGNOSIS — Z510 Encounter for antineoplastic radiation therapy: Secondary | ICD-10-CM | POA: Diagnosis not present

## 2015-01-08 DIAGNOSIS — R070 Pain in throat: Secondary | ICD-10-CM

## 2015-01-08 DIAGNOSIS — R21 Rash and other nonspecific skin eruption: Secondary | ICD-10-CM

## 2015-01-08 MED ORDER — HEPARIN SOD (PORK) LOCK FLUSH 100 UNIT/ML IV SOLN
500.0000 [IU] | Freq: Once | INTRAVENOUS | Status: AC | PRN
  Administered 2015-01-08: 500 [IU]
  Filled 2015-01-08: qty 5

## 2015-01-08 MED ORDER — SODIUM CHLORIDE 0.9 % IV SOLN
Freq: Once | INTRAVENOUS | Status: AC
  Administered 2015-01-08: 12:00:00 via INTRAVENOUS

## 2015-01-08 MED ORDER — SODIUM CHLORIDE 0.9 % IJ SOLN
10.0000 mL | INTRAMUSCULAR | Status: DC | PRN
  Administered 2015-01-08: 10 mL
  Filled 2015-01-08: qty 10

## 2015-01-08 NOTE — Telephone Encounter (Signed)
Per chemo RN I have moved appts from sickle cell center to her for the next two days. Patient's daughter aware

## 2015-01-08 NOTE — Progress Notes (Signed)
Nutrition follow-up completed with patient and daughter during IV fluids. Last weight documented 168 pounds. Patient tolerating 6 bottles of Nutren 1.5 through his feeding tube without difficulty. Patient reports approximately 4 loose stools daily and attributes this to recent antibiotics. Daughter continues to support patient with tube feeding and free water flushes.  Nutrition diagnosis: Inadequate oral intake continues.  Intervention:  Patient should continue 6 bottles of Nutren 1.5 daily with 32 ounces of water daily by mouth or through tube. Encouraged patient to begin full liquid diet as tolerated and allowed by M.D. secondary to recent dysphasia. Explained gradual progression to solids foods after patient deemed safe for diet advancement. Patient reports understanding. Teach back method was used.  Monitoring, evaluation, goals: Patient will continue tube feedings plus oral intake to minimize weight loss.  Next visit: Wednesday, November 16, during infusion.  **Disclaimer: This note was dictated with voice recognition software. Similar sounding words can inadvertently be transcribed and this note may contain transcription errors which may not have been corrected upon publication of note.**

## 2015-01-08 NOTE — Progress Notes (Signed)
  Oncology Nurse Navigator Documentation   Navigator Encounter Type: Clinic/MDC (01/06/15 1540) Patient Visit Type: Radonc (01/06/15 1540)     Met with patient during [place] with Dr. Isidore Moos.  He was accompanied by his dtr [name]. He understands he will be scheduled for IVF tomorrow, that I will arrange and notify of appt. [name] indicated her sister will be coming in from [place] tomorrow to help with transportation/care.  Will continue to follow.  [name], RN, BSN, [place] at [place] [phone]                  Time Spent with Patient: 30 (01/06/15 1540)

## 2015-01-08 NOTE — Patient Instructions (Signed)

## 2015-01-08 NOTE — Telephone Encounter (Signed)
  Oncology Nurse Navigator Documentation   Navigator Encounter Type: Telephone (01/07/15 0931)     Spoke with Charge RN, Jan, obtained 1:00pm appt for IVF.  LVM for patient dtrs informing, asked them to call to confirm message receipt.  Later received VMM from dtr Mickel Baas who would be bringing him today for appts.  [name], RN, BSN, [place] at [place] [phone]                     Time Spent with Patient: 15 (01/07/15 0931)

## 2015-01-09 ENCOUNTER — Ambulatory Visit: Payer: Self-pay

## 2015-01-09 ENCOUNTER — Encounter (HOSPITAL_COMMUNITY): Payer: Self-pay

## 2015-01-09 ENCOUNTER — Ambulatory Visit (HOSPITAL_BASED_OUTPATIENT_CLINIC_OR_DEPARTMENT_OTHER): Payer: Non-veteran care

## 2015-01-09 ENCOUNTER — Ambulatory Visit
Admission: RE | Admit: 2015-01-09 | Discharge: 2015-01-09 | Disposition: A | Discharge status: Home / Self Care | Payer: Non-veteran care | Source: Ambulatory Visit | Attending: Radiation Oncology | Admitting: Radiation Oncology

## 2015-01-09 VITALS — BP 132/53 | HR 96 | Temp 99.3°F | Resp 18

## 2015-01-09 DIAGNOSIS — C01 Malignant neoplasm of base of tongue: Secondary | ICD-10-CM

## 2015-01-09 DIAGNOSIS — R070 Pain in throat: Secondary | ICD-10-CM

## 2015-01-09 DIAGNOSIS — Z510 Encounter for antineoplastic radiation therapy: Secondary | ICD-10-CM | POA: Diagnosis not present

## 2015-01-09 DIAGNOSIS — R21 Rash and other nonspecific skin eruption: Secondary | ICD-10-CM

## 2015-01-09 MED ORDER — OXYCODONE-ACETAMINOPHEN 5-325 MG PO TABS
ORAL_TABLET | ORAL | Status: AC
  Filled 2015-01-09: qty 1

## 2015-01-09 MED ORDER — SODIUM CHLORIDE 0.9 % IV SOLN
Freq: Once | INTRAVENOUS | Status: AC
  Administered 2015-01-09: 09:00:00 via INTRAVENOUS

## 2015-01-09 MED ORDER — SODIUM CHLORIDE 0.9 % IJ SOLN
10.0000 mL | INTRAMUSCULAR | Status: DC | PRN
  Administered 2015-01-09: 10 mL
  Filled 2015-01-09: qty 10

## 2015-01-09 MED ORDER — SODIUM CHLORIDE 0.9 % IV SOLN
Freq: Once | INTRAVENOUS | Status: AC
  Administered 2015-01-09: 09:00:00 via INTRAVENOUS
  Filled 2015-01-09: qty 2

## 2015-01-09 MED ORDER — HEPARIN SOD (PORK) LOCK FLUSH 100 UNIT/ML IV SOLN
500.0000 [IU] | Freq: Once | INTRAVENOUS | Status: AC | PRN
  Administered 2015-01-09: 500 [IU]
  Filled 2015-01-09: qty 5

## 2015-01-09 MED ORDER — OXYCODONE-ACETAMINOPHEN 5-325 MG PO TABS
1.0000 | ORAL_TABLET | ORAL | Status: DC | PRN
  Administered 2015-01-09: 1 via ORAL

## 2015-01-09 NOTE — Progress Notes (Signed)
Patient reporting this is the worse throat pain he has had "in months".  Dr. Calton Dach nurse made aware of patient's requests for pain medication to take home in pill form.  He was given a po pain med here. Cameo RN stated he should try his liquid pain medicine at home that he has not used due to drowsiness so she suggested he try to titrate to a smaller dose with a few drops under tongue or in feeding tube. Pt and daughter agreed to try this at home.

## 2015-01-09 NOTE — Patient Instructions (Signed)

## 2015-01-10 ENCOUNTER — Ambulatory Visit: Payer: Self-pay

## 2015-01-10 ENCOUNTER — Ambulatory Visit (HOSPITAL_BASED_OUTPATIENT_CLINIC_OR_DEPARTMENT_OTHER): Payer: Non-veteran care

## 2015-01-10 ENCOUNTER — Ambulatory Visit
Admission: RE | Admit: 2015-01-10 | Discharge: 2015-01-10 | Disposition: A | Discharge status: Home / Self Care | Payer: Non-veteran care | Source: Ambulatory Visit | Attending: Radiation Oncology | Admitting: Radiation Oncology

## 2015-01-10 ENCOUNTER — Encounter (HOSPITAL_COMMUNITY): Payer: Self-pay

## 2015-01-10 VITALS — BP 107/57 | HR 88 | Temp 98.4°F | Resp 18

## 2015-01-10 DIAGNOSIS — C01 Malignant neoplasm of base of tongue: Secondary | ICD-10-CM | POA: Diagnosis not present

## 2015-01-10 DIAGNOSIS — R070 Pain in throat: Secondary | ICD-10-CM

## 2015-01-10 DIAGNOSIS — R21 Rash and other nonspecific skin eruption: Secondary | ICD-10-CM

## 2015-01-10 DIAGNOSIS — Z510 Encounter for antineoplastic radiation therapy: Secondary | ICD-10-CM | POA: Diagnosis not present

## 2015-01-10 MED ORDER — HEPARIN SOD (PORK) LOCK FLUSH 100 UNIT/ML IV SOLN
500.0000 [IU] | Freq: Once | INTRAVENOUS | Status: AC | PRN
  Administered 2015-01-10: 500 [IU]
  Filled 2015-01-10: qty 5

## 2015-01-10 MED ORDER — SODIUM CHLORIDE 0.9 % IV SOLN
Freq: Once | INTRAVENOUS | Status: AC
  Administered 2015-01-10: 09:00:00 via INTRAVENOUS

## 2015-01-10 MED ORDER — SODIUM CHLORIDE 0.9 % IJ SOLN
10.0000 mL | INTRAMUSCULAR | Status: DC | PRN
  Administered 2015-01-10: 10 mL
  Filled 2015-01-10: qty 10

## 2015-01-10 MED ORDER — SODIUM CHLORIDE 0.9 % IV SOLN: INTRAVENOUS | Status: DC

## 2015-01-10 NOTE — Patient Instructions (Signed)

## 2015-01-11 ENCOUNTER — Telehealth: Payer: Self-pay | Admitting: Hematology and Oncology

## 2015-01-11 ENCOUNTER — Ambulatory Visit: Payer: Self-pay

## 2015-01-11 NOTE — Telephone Encounter (Signed)
Returned call to [facility] re cancelling 11/15. Spoke with [name] and cxd 11/15 due to she cannot bring patient but will keep other appointments for week of 11/14. Left message for desk nurse.

## 2015-01-13 ENCOUNTER — Telehealth: Payer: Self-pay | Admitting: *Deleted

## 2015-01-13 ENCOUNTER — Encounter: Payer: Self-pay | Admitting: *Deleted

## 2015-01-13 ENCOUNTER — Ambulatory Visit (HOSPITAL_BASED_OUTPATIENT_CLINIC_OR_DEPARTMENT_OTHER): Payer: Non-veteran care

## 2015-01-13 ENCOUNTER — Ambulatory Visit (HOSPITAL_BASED_OUTPATIENT_CLINIC_OR_DEPARTMENT_OTHER)
Admission: RE | Admit: 2015-01-13 | Discharge: 2015-01-13 | Disposition: A | Discharge status: Home / Self Care | Payer: Non-veteran care | Source: Ambulatory Visit | Attending: Radiation Oncology | Admitting: Radiation Oncology

## 2015-01-13 ENCOUNTER — Ambulatory Visit: Payer: Self-pay

## 2015-01-13 ENCOUNTER — Ambulatory Visit
Admission: RE | Admit: 2015-01-13 | Discharge: 2015-01-13 | Disposition: A | Discharge status: Home / Self Care | Payer: Non-veteran care | Source: Ambulatory Visit | Attending: Radiation Oncology | Admitting: Radiation Oncology

## 2015-01-13 ENCOUNTER — Other Ambulatory Visit: Payer: Self-pay

## 2015-01-13 ENCOUNTER — Encounter: Payer: Self-pay | Admitting: Radiation Oncology

## 2015-01-13 VITALS — BP 101/69 | HR 96 | Temp 98.3°F | Resp 18

## 2015-01-13 VITALS — BP 119/63 | HR 99 | Temp 99.0°F | Resp 16

## 2015-01-13 DIAGNOSIS — R21 Rash and other nonspecific skin eruption: Secondary | ICD-10-CM

## 2015-01-13 DIAGNOSIS — R070 Pain in throat: Secondary | ICD-10-CM

## 2015-01-13 DIAGNOSIS — Z510 Encounter for antineoplastic radiation therapy: Secondary | ICD-10-CM | POA: Diagnosis not present

## 2015-01-13 DIAGNOSIS — C01 Malignant neoplasm of base of tongue: Secondary | ICD-10-CM

## 2015-01-13 LAB — CBC WITH DIFFERENTIAL/PLATELET
BASO%: 0.7 % (ref 0.0–2.0)
Basophils Absolute: 0.1 10*3/uL (ref 0.0–0.1)
EOS ABS: 0.4 10*3/uL (ref 0.0–0.5)
EOS%: 4.2 % (ref 0.0–7.0)
HCT: 34.2 % — ABNORMAL LOW (ref 38.4–49.9)
HGB: 10.7 g/dL — ABNORMAL LOW (ref 13.0–17.1)
LYMPH%: 16.9 % (ref 14.0–49.0)
MCH: 24.1 pg — AB (ref 27.2–33.4)
MCHC: 31.2 g/dL — AB (ref 32.0–36.0)
MCV: 77 fL — AB (ref 79.3–98.0)
MONO#: 1.2 10*3/uL — AB (ref 0.1–0.9)
MONO%: 13.4 % (ref 0.0–14.0)
NEUT#: 6 10*3/uL (ref 1.5–6.5)
NEUT%: 64.8 % (ref 39.0–75.0)
PLATELETS: 495 10*3/uL — AB (ref 140–400)
RBC: 4.44 10*6/uL (ref 4.20–5.82)
RDW: 15.7 % — ABNORMAL HIGH (ref 11.0–14.6)
WBC: 9.2 10*3/uL (ref 4.0–10.3)
lymph#: 1.6 10*3/uL (ref 0.9–3.3)

## 2015-01-13 LAB — COMPREHENSIVE METABOLIC PANEL (CC13)
ALT: 16 U/L (ref 0–55)
ANION GAP: 10 meq/L (ref 3–11)
AST: 23 U/L (ref 5–34)
Albumin: 2.6 g/dL — ABNORMAL LOW (ref 3.5–5.0)
Alkaline Phosphatase: 71 U/L (ref 40–150)
BUN: 30 mg/dL — ABNORMAL HIGH (ref 7.0–26.0)
CHLORIDE: 105 meq/L (ref 98–109)
CO2: 24 meq/L (ref 22–29)
Calcium: 9.5 mg/dL (ref 8.4–10.4)
Creatinine: 0.8 mg/dL (ref 0.7–1.3)
EGFR: 84 mL/min/{1.73_m2} — AB (ref >90)
Glucose: 75 mg/dl (ref 70–140)
Potassium: 4.9 mEq/L (ref 3.5–5.1)
Sodium: 139 mEq/L (ref 136–145)
Total Bilirubin: <0.3 mg/dL (ref 0.20–1.20)
Total Protein: 6.6 g/dL (ref 6.4–8.3)

## 2015-01-13 MED ORDER — SODIUM CHLORIDE 0.9 % IJ SOLN
10.0000 mL | INTRAMUSCULAR | Status: DC | PRN
  Filled 2015-01-13: qty 10

## 2015-01-13 MED ORDER — SODIUM CHLORIDE 0.9 % IJ SOLN
10.0000 mL | Freq: Once | INTRAMUSCULAR | Status: AC
  Administered 2015-01-13: 10 mL via INTRAVENOUS

## 2015-01-13 MED ORDER — SODIUM CHLORIDE 0.9 % IV SOLN
Freq: Once | INTRAVENOUS | Status: AC
  Administered 2015-01-13: 14:00:00 via INTRAVENOUS

## 2015-01-13 MED ORDER — HEPARIN SOD (PORK) LOCK FLUSH 100 UNIT/ML IV SOLN
500.0000 [IU] | Freq: Once | INTRAVENOUS | Status: DC | PRN
  Filled 2015-01-13: qty 5

## 2015-01-13 MED ORDER — HEPARIN SOD (PORK) LOCK FLUSH 100 UNIT/ML IV SOLN
500.0000 [IU] | Freq: Once | INTRAVENOUS | Status: AC
  Administered 2015-01-13: 500 [IU] via INTRAVENOUS

## 2015-01-13 NOTE — Patient Instructions (Signed)

## 2015-01-13 NOTE — Addendum Note (Signed)
Encounter addended by: Jacqulyn Liner, RN on: 01/13/2015  5:57 PM<BR>     Documentation filed: Inpatient MAR

## 2015-01-13 NOTE — Progress Notes (Signed)
Weekly Management Note:  Outpatient    ICD-9-CM ICD-10-CM   1. Cancer of base of tongue (HCC) 141.0 C01 Comprehensive metabolic panel     CBC with Differential    Current Dose:  54 Gy  Projected Dose: 70 Gy   Narrative:  The patient presents for routine under treatment assessment.  CBCT/MVCT images/Port film x-rays were reviewed.  The chart was checked.  Mr. With fluid hydration of 0.9 NS at the present time.  He is very drowsy and states he is tired.  denies any pain  Temp 99.0.  VSS. Orthostatics negative.  Family overwhelmed by caring for him.  He is falling at home, not following directions, hard of hearing, poor eye sight, weak,  cannot administer feedings himself, sleeps a lot. Causing a lot of stress in family.    Patient Vitals for the past 24 hrs:  BP Temp Pulse Resp SpO2  01/13/15 1713 119/63 mmHg - 99 - -  01/13/15 1704 117/67 mmHg 99 F (37.2 C) 91 16 99 %      Physical Findings:  Wt Readings from Last 3 Encounters:  01/06/15 168 lb 8 oz (76.431 kg)  01/03/15 168 lb 14 oz (76.6 kg)  12/27/14 173 lb 3.2 oz (78.563 kg)    temperature is 99 F (37.2 C). His blood pressure is 119/63 and his pulse is 99. His respiration is 16 and oxygen saturation is 99%.   Ambulatory. Patchy mucositis and erythema over his oral cavity with punctate bleeding over palate . Mucus membranes are moist, no thrush. Erythematous skin is dry and starting to flake.  CBC    Component Value Date/Time   WBC 9.2 01/13/2015 1558   WBC 10.2 12/31/2014 0500   RBC 4.44 01/13/2015 1558   RBC 3.64* 12/31/2014 0500   HGB 10.7* 01/13/2015 1558   HGB 9.1* 12/31/2014 0500   HCT 34.2* 01/13/2015 1558   HCT 29.2* 12/31/2014 0500   PLT 495* 01/13/2015 1558   PLT 319 12/31/2014 0500   MCV 77.0* 01/13/2015 1558   MCV 80.2 12/31/2014 0500   MCH 24.1* 01/13/2015 1558   MCH 25.0* 12/31/2014 0500   MCHC 31.2* 01/13/2015 1558   MCHC 31.2 12/31/2014 0500   RDW 15.7* 01/13/2015 1558   RDW 14.9 12/31/2014  0500   LYMPHSABS 1.6 01/13/2015 1558   LYMPHSABS 0.7 12/30/2014 1237   MONOABS 1.2* 01/13/2015 1558   MONOABS 0.9 12/30/2014 1237   EOSABS 0.4 01/13/2015 1558   EOSABS 0.0 12/30/2014 1237   BASOSABS 0.1 01/13/2015 1558   BASOSABS 0.0 12/30/2014 1237     CMP     Component Value Date/Time   NA 139 01/13/2015 1557   NA 137 12/31/2014 0500   K 4.9 01/13/2015 1557   K 3.7 12/31/2014 0500   CL 108 12/31/2014 0500   CO2 24 01/13/2015 1557   CO2 23 12/31/2014 0500   GLUCOSE 75 01/13/2015 1557   GLUCOSE 88 12/31/2014 0500   BUN 30.0* 01/13/2015 1557   BUN 16 12/31/2014 0500   CREATININE 0.8 01/13/2015 1557   CREATININE 0.74 12/31/2014 0500   CALCIUM 9.5 01/13/2015 1557   CALCIUM 8.0* 12/31/2014 0500   PROT 6.6 01/13/2015 1557   PROT 5.4* 12/31/2014 0500   ALBUMIN 2.6* 01/13/2015 1557   ALBUMIN 2.2* 12/31/2014 0500   AST 23 01/13/2015 1557   AST 14* 12/31/2014 0500   ALT 16 01/13/2015 1557   ALT 12* 12/31/2014 0500   ALKPHOS 71 01/13/2015 1557   ALKPHOS 39  12/31/2014 0500   BILITOT <0.30 01/13/2015 1557   BILITOT 0.9 12/31/2014 0500   GFRNONAA >60 12/31/2014 0500   GFRAA >60 12/31/2014 0500   Impression:  The patient is tolerating radiotherapy. Dehydration (BUN elevated.... IV fluids given thereafter today)  Plan:  Continue radiotherapy as planned.  Spoke with daughter, son in Sports coach, social worker (L Mullis) and Wynona Meals RN in a family meeting today prior to seeing pt. They wish for SNF placement.  Unfortunately, there is not means for direct admission based on my discussion with the hospitalist as a way to get into a SNF (insurance limitations).  [name] will explore other options w/ family.  They know that going to the ED is always an option, but not a guarantee of admission or SNF placement with insurance coverage.  Continue Home Health. Possible assisted living placement in future -----------------------------------  Eppie Gibson, MD

## 2015-01-13 NOTE — Progress Notes (Signed)
Right chest port-a-cath flushed with 10 cc normal saline and 500 units of heparin.  Deaccessed needle.  Site was bleeding. Pressure applied.  Site checked with Patric Dykes, RN and no visible bleeding was noted.  Band-aid applied and patient was escorted out of the clinic by his daughter.

## 2015-01-13 NOTE — Progress Notes (Signed)
Mr. With fluid hydration of 0.9 NS at the present time.  He is very drowsy and states he is tired.  dnies any pain  Temp 99.0.  VSS.  Patient Vitals for the past 24 hrs:  BP Temp Pulse Resp SpO2  01/13/15 1713 119/63 mmHg - 99 - -  01/13/15 1704 117/67 mmHg 99 F (37.2 C) 91 16 99 %

## 2015-01-13 NOTE — Addendum Note (Signed)
Encounter addended by: Jacqulyn Liner, RN on: 01/13/2015  6:02 PM<BR>     Documentation filed: Dx [employer], Inpatient MAR, Notes Section, Inpatient Document Flowsheet, Orders

## 2015-01-13 NOTE — Progress Notes (Signed)
  Oncology Nurse Navigator Documentation   Navigator Encounter Type: Other (01/13/15 1415)     Met with patient's dtr Kenneth Jordan and her husband Kenneth Jordan during meeting with Dr. Isidore Moos to discuss Kenneth Jordan's care needs.  [name], LCSW, participated. [name] recapped events of this past weekend, his current mental and physical condition, her concerns for his wellbeing and safety.  Dr. Isidore Moos and [name] are to work on care options.  [name], RN, BSN, [place] at [place] [phone]                     Time Spent with Patient: 60 (01/13/15 1415)

## 2015-01-13 NOTE — Telephone Encounter (Signed)
  Oncology Nurse Navigator Documentation   Navigator Encounter Type: Telephone (01/13/15 [id number])         Interventions: Coordination of Care (01/13/15 [id number])     Patient's dtr Kenneth Jordan called with strongly expressed concerns for Kenneth Jordan's safety and well-being.  Fell in the shower last night but did not injure himself.  Shower is too small to accommodate a shower stool.  Weak but refuses to use a walker or other assist device.  Sleeping about 20 hrs/daily.  Has lost noticeable muscle mass.  Was assessed by a [place] RN on Sat, she indicated he needs enhanced care. Kenneth Jordan indicated she:  Is unable to provide the necessary care for him at home, especially on the days she works (Tues, [place], Friday).  Wants to talk with Dr. Isidore Moos in private when Kenneth Jordan comes to [facility] today.  I indicated I would make arrangements for her to do so. I made Dr. Isidore Moos aware of situation, she agreed to meet with Kenneth Jordan today. I contacted [name], LCSW, for further assistance/guidance.  [name], RN, BSN, [place] at [place] [phone]           Time Spent with Patient: 15 (01/13/15 [id number])

## 2015-01-14 ENCOUNTER — Ambulatory Visit: Payer: Self-pay

## 2015-01-14 ENCOUNTER — Ambulatory Visit
Admission: RE | Admit: 2015-01-14 | Discharge: 2015-01-14 | Disposition: A | Discharge status: Home / Self Care | Payer: Non-veteran care | Source: Ambulatory Visit | Attending: Radiation Oncology | Admitting: Radiation Oncology

## 2015-01-14 ENCOUNTER — Other Ambulatory Visit: Payer: Self-pay

## 2015-01-14 DIAGNOSIS — Z510 Encounter for antineoplastic radiation therapy: Secondary | ICD-10-CM | POA: Diagnosis not present

## 2015-01-14 NOTE — Care Management Note (Signed)
Case Management Note  Patient Details  Name: Kenneth Jordan MRN: [id number] Date of Birth: [birth year]  Subjective/Objective:  Received call today from [facility] Medicare rep -Tiffany-f/u on daughters concerns.  I explained that I had already received a call from [facility] rep on the f/u, & that I was very busy.  She didn't have any notes to refer to but only the daughters concerns-[facility] HINN letter of a not covered continued stay.  I explained that this is a process we follow when appropriate, & info was reviewed to daughters even though they didn't want to sign form.She asked about being declined SNF-I explained that patient ambulated 450 ft-per social worker-facilities declined acceptance.  I explained that these daughters voiced understanding with all the info given @ the time. No further discussions.                  Action/Plan:   Expected Discharge Date:   (UNKNOWN)               Expected Discharge Plan:  [place]  In-House Referral:  Clinical Social Work  Discharge planning Services  CM Consult  Post Acute Care Choice:    Choice offered to:  Adult Children  DME Arranged:    DME Agency:     HH Arranged:  RN, PT, Nurse's Aide, Social Work, OT [employer] Agency:  [place]  Status of Service:  Completed, signed off  Medicare Important Message Given:  Yes-second notification given Date Medicare IM Given:    Medicare IM give by:    Date Additional Medicare IM Given:    Additional Medicare Important Message give by:     If discussed at [place] of Stay Meetings, dates discussed:    Additional Comments:  Dessa Phi, RN 01/14/2015, 3:41 PM

## 2015-01-15 ENCOUNTER — Ambulatory Visit (HOSPITAL_BASED_OUTPATIENT_CLINIC_OR_DEPARTMENT_OTHER): Payer: Non-veteran care

## 2015-01-15 ENCOUNTER — Ambulatory Visit: Payer: Non-veteran care | Admitting: Nutrition

## 2015-01-15 ENCOUNTER — Ambulatory Visit
Admission: RE | Admit: 2015-01-15 | Discharge: 2015-01-15 | Disposition: A | Discharge status: Home / Self Care | Payer: Non-veteran care | Source: Ambulatory Visit | Attending: Radiation Oncology | Admitting: Radiation Oncology

## 2015-01-15 ENCOUNTER — Encounter: Payer: Self-pay | Admitting: Hematology and Oncology

## 2015-01-15 ENCOUNTER — Ambulatory Visit (HOSPITAL_BASED_OUTPATIENT_CLINIC_OR_DEPARTMENT_OTHER): Payer: Non-veteran care | Admitting: Hematology and Oncology

## 2015-01-15 VITALS — BP 137/64 | HR 102 | Temp 99.5°F | Resp 18

## 2015-01-15 DIAGNOSIS — E46 Unspecified protein-calorie malnutrition: Secondary | ICD-10-CM

## 2015-01-15 DIAGNOSIS — C01 Malignant neoplasm of base of tongue: Secondary | ICD-10-CM

## 2015-01-15 DIAGNOSIS — K30 Functional dyspepsia: Secondary | ICD-10-CM

## 2015-01-15 DIAGNOSIS — K9281 Gastrointestinal mucositis (ulcerative): Secondary | ICD-10-CM

## 2015-01-15 DIAGNOSIS — R12 Heartburn: Secondary | ICD-10-CM | POA: Diagnosis not present

## 2015-01-15 DIAGNOSIS — R070 Pain in throat: Secondary | ICD-10-CM

## 2015-01-15 DIAGNOSIS — K9429 Other complications of gastrostomy: Secondary | ICD-10-CM

## 2015-01-15 DIAGNOSIS — R21 Rash and other nonspecific skin eruption: Secondary | ICD-10-CM

## 2015-01-15 DIAGNOSIS — Z510 Encounter for antineoplastic radiation therapy: Secondary | ICD-10-CM | POA: Diagnosis not present

## 2015-01-15 MED ORDER — SODIUM CHLORIDE 0.9 % IV SOLN
Freq: Once | INTRAVENOUS | Status: AC
  Administered 2015-01-15: 11:00:00 via INTRAVENOUS

## 2015-01-15 MED ORDER — HEPARIN SOD (PORK) LOCK FLUSH 100 UNIT/ML IV SOLN
500.0000 [IU] | Freq: Once | INTRAVENOUS | Status: AC | PRN
  Administered 2015-01-15: 500 [IU]
  Filled 2015-01-15: qty 5

## 2015-01-15 MED ORDER — ALUM & MAG HYDROXIDE-SIMETH 200-200-20 MG/5ML PO SUSP
30.0000 mL | ORAL | Status: AC
  Administered 2015-01-15: 30 mL via ORAL
  Filled 2015-01-15: qty 30

## 2015-01-15 MED ORDER — SODIUM CHLORIDE 0.9 % IJ SOLN
10.0000 mL | INTRAMUSCULAR | Status: DC | PRN
  Administered 2015-01-15: 10 mL
  Filled 2015-01-15: qty 10

## 2015-01-15 NOTE — Progress Notes (Signed)
Nutrition follow-up completed with patient and daughter during IV fluids. Last weight documented 168.5 pounds November 7.  Patient started at 170 pounds September 21. Patient tolerates tube feedings of Nutren 1.5 through his feeding tube. Daughter reports she will occasionally provide 3 bottles at one time because she does not have time to infuse tube feedings more than twice daily. Patient is unable to administer his own tube feedings. Patient denies nausea vomiting, constipation or diarrhea.  Daughter reports she thinks patient might have diarrhea. Daughter also providing approximately 16 ounces of water daily.  Patient is receiving IV fluids between 3 and 5 days weekly. Patient has not tried to drink any water or other fluids at this time. Patient states he does not want anything to eat or drink.  Nutrition diagnosis: Inadequate oral intake continues.  Intervention:  I explained to daughter that 2 bottles of Nutren 3 times a day would likely be better tolerated than 3 bottles twice a day. If patient is having diarrhea, this may be contributing to loose stools. Weight is generally stable at 168 pounds. Provided supportive listening and encouragement. Encouraged patient to try to drink water as tolerated.  Monitoring, evaluation, goals: Patient will continue tube feedings to minimize weight loss.  Diet to advance as tolerated and safe.  Next visit: Tuesday, November 22, during IV fluids.  **Disclaimer: This note was dictated with voice recognition software. Similar sounding words can inadvertently be transcribed and this note may contain transcription errors which may not have been corrected upon publication of note.**

## 2015-01-15 NOTE — Assessment & Plan Note (Signed)
He has mucositis on his tongue, likely due to treatment, despite significant doses adjustment. I will discontinued cetuximab. He rarely used the prescription of morphine sulfate does not like to take it because he does not like to be sedated. According to his daughter, the patient does not tolerate Percocet well or Dilaudid. Continue conservative management with Magic mouthwash and Tylenol only as needed.

## 2015-01-15 NOTE — Assessment & Plan Note (Signed)
We will try some Maalox today.

## 2015-01-15 NOTE — Assessment & Plan Note (Signed)
He had significant protein calorie malnutrition. According to his daughter, he was able to tolerate 6 cans of nutritional supplement per day. We will continue to maintain nutrition through his feeding tube and I will provide IV fluid support daily. 

## 2015-01-15 NOTE — Patient Instructions (Signed)

## 2015-01-15 NOTE — Assessment & Plan Note (Signed)
I have a long discussion with the patient's daughter. His performance status continued to decline and the patient has verbalized to family members to he would not want to continue on systemic treatment.  I will continue to provide supportive care only throughout the remainder of his radiation treatment.

## 2015-01-15 NOTE — Progress Notes (Signed)
[place] OFFICE PROGRESS NOTE  Patient Care Team: Pcp Not In System as PCP - General Leota Sauers, RN as Oncology Nurse Navigator Heath Lark, MD as Consulting Physician (Hematology and Oncology) Eppie Gibson, MD as Attending Physician (Radiation Oncology) Karie Mainland, RD as Dietitian (Nutrition)  SUMMARY OF ONCOLOGIC HISTORY: Oncology History   Cancer of base of tongue   Staging form: Lip and Oral Cavity, AJCC 7th Edition     Clinical stage from 11/08/2014: Stage IVA (T4a, N2c, M0) - Signed by Heath Lark, MD on 11/08/2014       Cancer of base of tongue ([place])   07/08/2014 Miscellaneous  the patient noted tongue swelling / mass   10/04/2014 Imaging  CT scan done at the [facility] show large tongue mass with bilateral neck lymphadenopathy   10/07/2014 Miscellaneous  he was seen at the [facility] for evaluation was noted to have tongue cancer   10/10/2014 Imaging  PET CT scan from the [facility] showed extensive regional lymphadenopathy on the left side including supraclavicular lymph node involvement and possibly right jugulodigastric lymph node involvement.   10/24/2014 Procedure  he underwent laryngoscopy and biopsy. there is a large ulcerative tongue a mass on the left involving the tonsil, soft palate, epiglottis.   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 Treatment was stopped due to decline in performance status and various side-effects   12/04/2014 -  Radiation Therapy he received radiation treatment   12/04/2014 Adverse Reaction he had infusion reaction from Erbitux   12/17/2014 Adverse Reaction He is noted to have worsening skin rash and oral mucositis related to side effects of Erbitux. The dose of Erbitux is reduced by 50%   12/30/2014  - 01/03/2015 Hospital Admission He was admitted to the hospital for altered mental status and sepsis. He received antibiotics for UTI and fluconazole for thrush   12/30/2014 Imaging CT Neck w Contrast - 1. Some regression of the large left tongue tumor with involvement of the sublingual space. Tumor margins now indistinct. 2. Stable to slightly increased malignant left level 2 and level 3 nodes since August.     INTERVAL HISTORY: Please see below for problem oriented charting. He is seen in the infusion area. According to his daughter, the patient is very weak and sleep 16 hours a day. He is able to tolerate 6 cans of nutritional supplement. He complained of some indigestion sensation that comes and goes. He declined pain medicine because of poor tolerance.  REVIEW OF SYSTEMS:   Constitutional: Denies fevers, chills or abnormal weight loss Eyes: Denies blurriness of vision Respiratory: Denies cough, dyspnea or wheezes Cardiovascular: Denies palpitation, chest discomfort or lower extremity swelling Lymphatics: Denies new lymphadenopathy or easy bruising Neurological:Denies numbness, tingling or new weaknesses Behavioral/Psych: Mood is stable, no new changes  All other systems were reviewed with the patient and are negative.  I have reviewed the past medical history, past surgical history, social history and family history with the patient and they are unchanged from previous note.  ALLERGIES:  is allergic to dilaudid and other.  MEDICATIONS:  Current Outpatient Prescriptions  Medication Sig Dispense Refill  . acetaminophen (TYLENOL) 325 MG tablet Take 650 mg by mouth every 6 (six) hours as needed (For pain.).    Marland Kitchen  cephALEXin (KEFLEX) 500 MG capsule Take 1 capsule (500 mg total) by mouth 2 (two) times daily. (Patient not taking: Reported on 01/13/2015) 6 capsule 0  . fluconazole (DIFLUCAN) 100 MG tablet Take 1 tablet (100 mg total) by mouth daily. 5 tablet 0  . fluticasone (FLONASE) 50  MCG/ACT nasal spray Place 1 spray into the nose every morning.     Marland Kitchen HYDROcodone-acetaminophen (HYCET) 7.5-325 mg/15 ml solution Take 15 mLs by mouth every 6 (six) hours as needed for moderate pain. (Patient not taking: Reported on 01/13/2015) 180 mL 0  . hydrocortisone 1 % ointment Apply 1 application topically 2 (two) times daily. 1 large tube (Patient not taking: Reported on 01/13/2015) 56 g 0  . lidocaine (XYLOCAINE) 2 % solution Mix 1 part 2%viscous lidocaine,1part H2O.Swish and/or swallow 64mL of this mixture,46min before meals and at bedtime, up to QID (Patient not taking: Reported on 01/13/2015) 100 mL 5  . loratadine (CLARITIN) 10 MG tablet Take 10 mg by mouth daily as needed for allergies.    . Menthol, Topical Analgesic, 16 % LIQD Apply 1 application topically daily. Applies for shoulder pain.    Marland Kitchen metoCLOPramide (REGLAN) 10 MG tablet Take 1 tablet (10 mg total) by mouth 4 (four) times daily -  before meals and at bedtime. 40 tablet 1  . morphine (ROXANOL) 20 MG/ML concentrated solution Place 0.5 mLs (10 mg total) into feeding tube every 2 (two) hours as needed for severe pain. (Patient not taking: Reported on 01/13/2015) 240 mL 0  . omeprazole (PRILOSEC) 20 MG capsule Take 20 mg by mouth at bedtime as needed (For heartburn or acid reflux.).     Marland Kitchen ondansetron (ZOFRAN) 8 MG tablet Take 1 tablet (8 mg total) by mouth every 8 (eight) hours as needed for nausea. 30 tablet 3  . PRESCRIPTION MEDICATION Patient receives his chemo treatments at the [facility] with Dr. Alvy Bimler. He is receiving Cetuximab every 7 days. His last dose was on 12/25/14.    Marland Kitchen prochlorperazine (COMPAZINE) 10 MG tablet Take 1 tablet (10 mg total) by mouth every 6 (six) hours as needed. (Patient not taking: Reported on 01/13/2015) 30 tablet 3  . sodium fluoride (FLUORISHIELD) 1.1 % GEL dental gel Instill one drop of gel per tooth space of fluoride tray. Place over teeth for 5 minutes. Remove. Spit  out excess. Repeat nightly (Patient not taking: Reported on 01/13/2015) 120 mL prn  . sucralfate (CARAFATE) 1 GM/10ML suspension Take 1 g by mouth 4 (four) times daily as needed (For sore throat.).    Marland Kitchen zolpidem (AMBIEN) 10 MG tablet Take 0.5 tablets (5 mg total) by mouth at bedtime. (Patient not taking: Reported on 01/13/2015) 30 tablet 0   No current facility-administered medications for this visit.   Facility-Administered Medications Ordered in Other Visits  Medication Dose Route Frequency Provider Last Rate Last Dose  . sodium chloride 0.9 % injection 10 mL  10 mL Intracatheter PRN Heath Lark, MD   10 mL at 01/15/15 1253    PHYSICAL EXAMINATION: ECOG PERFORMANCE STATUS: 3 - Symptomatic, >50% confined to bed GENERAL:alert, no distress and comfortable. Appears sleepy SKIN: he has mild rash around his neck. No ulceration.  EYES: normal, Conjunctiva are pink and non-injected, sclera clear OROPHARYNX:no exudate, no erythema and lips, buccal mucosa, and tongue normal . Noted mild redness but no significant mucositis or thrush Musculoskeletal:no cyanosis of digits and no clubbing  NEURO: alert & oriented x 3 with fluent speech, no  focal motor/sensory deficits  LABORATORY DATA:  I have reviewed the data as listed    Component Value Date/Time   NA 139 01/13/2015 1557   NA 137 12/31/2014 0500   K 4.9 01/13/2015 1557   K 3.7 12/31/2014 0500   CL 108 12/31/2014 0500   CO2 24 01/13/2015 1557   CO2 23 12/31/2014 0500   GLUCOSE 75 01/13/2015 1557   GLUCOSE 88 12/31/2014 0500   BUN 30.0* 01/13/2015 1557   BUN 16 12/31/2014 0500   CREATININE 0.8 01/13/2015 1557   CREATININE 0.74 12/31/2014 0500   CALCIUM 9.5 01/13/2015 1557   CALCIUM 8.0* 12/31/2014 0500   PROT 6.6 01/13/2015 1557   PROT 5.4* 12/31/2014 0500   ALBUMIN 2.6* 01/13/2015 1557   ALBUMIN 2.2* 12/31/2014 0500   AST 23 01/13/2015 1557   AST 14* 12/31/2014 0500   ALT 16 01/13/2015 1557   ALT 12* 12/31/2014 0500   ALKPHOS 71  01/13/2015 1557   ALKPHOS 39 12/31/2014 0500   BILITOT <0.30 01/13/2015 1557   BILITOT 0.9 12/31/2014 0500   GFRNONAA >60 12/31/2014 0500   GFRAA >60 12/31/2014 0500    No results found for: SPEP, UPEP  Lab Results  Component Value Date   WBC 9.2 01/13/2015   NEUTROABS 6.0 01/13/2015   HGB 10.7* 01/13/2015   HCT 34.2* 01/13/2015   MCV 77.0* 01/13/2015   PLT 495* 01/13/2015      Chemistry      Component Value Date/Time   NA 139 01/13/2015 1557   NA 137 12/31/2014 0500   K 4.9 01/13/2015 1557   K 3.7 12/31/2014 0500   CL 108 12/31/2014 0500   CO2 24 01/13/2015 1557   CO2 23 12/31/2014 0500   BUN 30.0* 01/13/2015 1557   BUN 16 12/31/2014 0500   CREATININE 0.8 01/13/2015 1557   CREATININE 0.74 12/31/2014 0500      Component Value Date/Time   CALCIUM 9.5 01/13/2015 1557   CALCIUM 8.0* 12/31/2014 0500   ALKPHOS 71 01/13/2015 1557   ALKPHOS 39 12/31/2014 0500   AST 23 01/13/2015 1557   AST 14* 12/31/2014 0500   ALT 16 01/13/2015 1557   ALT 12* 12/31/2014 0500   BILITOT <0.30 01/13/2015 1557   BILITOT 0.9 12/31/2014 0500      ASSESSMENT & PLAN:   Cancer of base of tongue ([place])  I have a long discussion with the patient's daughter. His performance status continued to decline and the patient has verbalized to family members to he would not want to continue on systemic treatment.  I will continue to provide supportive care only throughout the remainder of his radiation treatment.  Protein calorie malnutrition ([place])  He had significant protein calorie malnutrition. According to his daughter, he was able to tolerate 6 cans of nutritional supplement per day. We will continue to maintain nutrition through his feeding tube and I will provide IV fluid support daily.   Stomal mucositis (HCC) He has mucositis on his tongue, likely due to treatment, despite significant doses adjustment. I will discontinued cetuximab. He rarely used the prescription of morphine sulfate  does not like to take it because he does not like to be sedated. According to his daughter, the patient does not tolerate Percocet well or Dilaudid. Continue conservative management with Magic mouthwash and Tylenol only as needed.    Heartburn symptom We will try some Maalox today.     All questions were answered. The patient knows to call the clinic with any problems,  questions or concerns. No barriers to learning was detected. I spent 15 minutes counseling the patient face to face. The total time spent in the appointment was 20 minutes and more than 50% was on counseling and review of test results     [facility], [place], MD 01/15/2015 3:05 PM

## 2015-01-16 ENCOUNTER — Telehealth: Payer: Self-pay | Admitting: *Deleted

## 2015-01-16 ENCOUNTER — Ambulatory Visit
Admission: RE | Admit: 2015-01-16 | Discharge: 2015-01-16 | Disposition: A | Discharge status: Home / Self Care | Payer: Non-veteran care | Source: Ambulatory Visit | Attending: Radiation Oncology | Admitting: Radiation Oncology

## 2015-01-16 ENCOUNTER — Ambulatory Visit (HOSPITAL_BASED_OUTPATIENT_CLINIC_OR_DEPARTMENT_OTHER): Payer: Non-veteran care

## 2015-01-16 VITALS — BP 130/62 | HR 91 | Temp 99.1°F | Resp 18

## 2015-01-16 DIAGNOSIS — R070 Pain in throat: Secondary | ICD-10-CM | POA: Diagnosis not present

## 2015-01-16 DIAGNOSIS — Z510 Encounter for antineoplastic radiation therapy: Secondary | ICD-10-CM | POA: Diagnosis not present

## 2015-01-16 DIAGNOSIS — R21 Rash and other nonspecific skin eruption: Secondary | ICD-10-CM

## 2015-01-16 DIAGNOSIS — C01 Malignant neoplasm of base of tongue: Secondary | ICD-10-CM | POA: Diagnosis not present

## 2015-01-16 MED ORDER — HEPARIN SOD (PORK) LOCK FLUSH 100 UNIT/ML IV SOLN
500.0000 [IU] | Freq: Once | INTRAVENOUS | Status: AC | PRN
  Administered 2015-01-16: 500 [IU]
  Filled 2015-01-16: qty 5

## 2015-01-16 MED ORDER — SODIUM CHLORIDE 0.9 % IJ SOLN
10.0000 mL | INTRAMUSCULAR | Status: DC | PRN
  Administered 2015-01-16: 10 mL
  Filled 2015-01-16: qty 10

## 2015-01-16 MED ORDER — SODIUM CHLORIDE 0.9 % IV SOLN
1000.0000 mL | Freq: Once | INTRAVENOUS | Status: AC
  Administered 2015-01-16: 1000 mL via INTRAVENOUS

## 2015-01-16 NOTE — Telephone Encounter (Signed)
  Oncology Nurse Navigator Documentation   Navigator Encounter Type: Telephone (01/16/15 1044)         Interventions: Coordination of Care (01/16/15 1044)   Coordination of Care: Other (01/16/15 1044)     LVMM informing patient dtr [name] that his IVF has been rescheduled for 3:30, that fluids will be initiated before you goes down for [address] tmt.  [name], RN, BSN, [place] [facility] [place] [phone]     Time Spent with Patient: 15 (01/16/15 1044)

## 2015-01-16 NOTE — Progress Notes (Signed)
Pt reports pain in his rt knee rating a 6 out of 10 and pain in his mouth rating a 4 out of 10. Pt states he does not want anything for pain or nausea while he is here today.

## 2015-01-16 NOTE — Patient Instructions (Signed)

## 2015-01-17 ENCOUNTER — Ambulatory Visit (HOSPITAL_BASED_OUTPATIENT_CLINIC_OR_DEPARTMENT_OTHER): Payer: Non-veteran care

## 2015-01-17 ENCOUNTER — Telehealth: Payer: Self-pay | Admitting: Oncology

## 2015-01-17 ENCOUNTER — Ambulatory Visit
Admission: RE | Admit: 2015-01-17 | Discharge: 2015-01-17 | Disposition: A | Discharge status: Home / Self Care | Payer: Non-veteran care | Source: Ambulatory Visit | Attending: Radiation Oncology | Admitting: Radiation Oncology

## 2015-01-17 VITALS — BP 144/73 | HR 95 | Temp 98.8°F | Resp 20

## 2015-01-17 DIAGNOSIS — E46 Unspecified protein-calorie malnutrition: Secondary | ICD-10-CM

## 2015-01-17 DIAGNOSIS — C01 Malignant neoplasm of base of tongue: Secondary | ICD-10-CM

## 2015-01-17 DIAGNOSIS — Z510 Encounter for antineoplastic radiation therapy: Secondary | ICD-10-CM | POA: Diagnosis not present

## 2015-01-17 MED ORDER — HEPARIN SOD (PORK) LOCK FLUSH 100 UNIT/ML IV SOLN
500.0000 [IU] | Freq: Once | INTRAVENOUS | Status: AC
  Administered 2015-01-17: 500 [IU] via INTRAVENOUS
  Filled 2015-01-17: qty 5

## 2015-01-17 MED ORDER — SODIUM CHLORIDE 0.9 % IV SOLN
1000.0000 mL | INTRAVENOUS | Status: AC
  Administered 2015-01-17: 14:00:00 via INTRAVENOUS

## 2015-01-17 MED ORDER — SODIUM CHLORIDE 0.9 % IJ SOLN
10.0000 mL | Freq: Once | INTRAMUSCULAR | Status: AC
  Administered 2015-01-17: 10 mL via INTRAVENOUS
  Filled 2015-01-17: qty 10

## 2015-01-17 NOTE — Telephone Encounter (Addendum)
Left a message for Kenneth Jordan to see if she would like to talk to Dr. Isidore Moos and Mike Craze, NP today at 1 pm.  Requested a return call.  Also left a message with Ubaldo Glassing, RN in infusion to have Kenneth Jordan come down to radiation at 1 pm.

## 2015-01-17 NOTE — Patient Instructions (Signed)

## 2015-01-18 ENCOUNTER — Ambulatory Visit (HOSPITAL_BASED_OUTPATIENT_CLINIC_OR_DEPARTMENT_OTHER): Payer: Non-veteran care

## 2015-01-18 DIAGNOSIS — Z452 Encounter for adjustment and management of vascular access device: Secondary | ICD-10-CM

## 2015-01-18 DIAGNOSIS — C01 Malignant neoplasm of base of tongue: Secondary | ICD-10-CM

## 2015-01-18 MED ORDER — SODIUM CHLORIDE 0.9 % IJ SOLN
10.0000 mL | INTRAMUSCULAR | Status: DC | PRN
Start: 2015-01-18 — End: 2015-01-18
  Administered 2015-01-18: 10 mL
  Filled 2015-01-18: qty 10

## 2015-01-18 MED ORDER — HEPARIN SOD (PORK) LOCK FLUSH 100 UNIT/ML IV SOLN
500.0000 [IU] | Freq: Once | INTRAVENOUS | Status: AC | PRN
  Administered 2015-01-18: 500 [IU]
  Filled 2015-01-18: qty 5

## 2015-01-18 MED ORDER — SODIUM CHLORIDE 0.9 % IV SOLN: Freq: Once | INTRAVENOUS | Status: DC

## 2015-01-18 NOTE — Patient Instructions (Signed)

## 2015-01-19 ENCOUNTER — Ambulatory Visit
Admission: RE | Admit: 2015-01-19 | Discharge: 2015-01-19 | Disposition: A | Discharge status: Home / Self Care | Payer: Non-veteran care | Source: Ambulatory Visit | Attending: Radiation Oncology | Admitting: Radiation Oncology

## 2015-01-19 DIAGNOSIS — Z510 Encounter for antineoplastic radiation therapy: Secondary | ICD-10-CM | POA: Diagnosis not present

## 2015-01-19 DIAGNOSIS — C01 Malignant neoplasm of base of tongue: Secondary | ICD-10-CM

## 2015-01-19 MED ORDER — SONAFINE EX EMUL
1.0000 "application " | Freq: Two times a day (BID) | CUTANEOUS | Status: DC
  Administered 2015-01-19: 1 via TOPICAL

## 2015-01-20 ENCOUNTER — Ambulatory Visit: Payer: Self-pay

## 2015-01-20 ENCOUNTER — Ambulatory Visit
Admission: RE | Admit: 2015-01-20 | Discharge: 2015-01-20 | Disposition: A | Discharge status: Home / Self Care | Payer: Non-veteran care | Source: Ambulatory Visit | Attending: Radiation Oncology | Admitting: Radiation Oncology

## 2015-01-20 ENCOUNTER — Encounter: Payer: Self-pay | Admitting: Radiation Oncology

## 2015-01-20 VITALS — BP 126/76 | HR 95 | Temp 98.9°F | Ht 69.0 in | Wt 169.2 lb

## 2015-01-20 DIAGNOSIS — Z510 Encounter for antineoplastic radiation therapy: Secondary | ICD-10-CM | POA: Diagnosis not present

## 2015-01-20 DIAGNOSIS — C01 Malignant neoplasm of base of tongue: Secondary | ICD-10-CM

## 2015-01-20 NOTE — Progress Notes (Signed)
Weekly Management Note:  Outpatient    ICD-9-CM ICD-10-CM   1. Cancer of base of tongue (HCC) 141.0 C01     Current Dose:  66 Gy  Projected Dose: 70 Gy   Narrative:  The patient presents for routine under treatment assessment.  CBCT/MVCT images/Port film x-rays were reviewed.  The chart was checked.  Mr. Bak presents for his 33rd fraction of radiation to his tongue and bilateral neck. He reports he has pain in his throat and roof of his mouth a 5/10. He is only taking nutrition through his feeding tube. He states he has no hunger or desire to eat. He takes Nutren 5-6 cans daily, and 16 ounces of free water. He is receiving IVF about every other day per his daughter's report.  Possible rehabilitation in [place] via [place] later this month       Physical Findings:  Wt Readings from Last 3 Encounters:  01/20/15 169 lb 3.2 oz (76.749 kg)  01/06/15 168 lb 8 oz (76.431 kg)  01/03/15 168 lb 14 oz (76.6 kg)    height is 5\' 9"  (1.753 m) and weight is 169 lb 3.2 oz (76.749 kg). His temperature is 98.9 F (37.2 C). His blood pressure is 126/76 and his pulse is 95.   Ambulatory. Patchy mucositis and erythema over his oral cavity with punctate bleeding over palate . Mucus membranes are moist, no thrush. Erythematous skin is dry over neck   CBC    Component Value Date/Time   WBC 9.2 01/13/2015 1558   WBC 10.2 12/31/2014 0500   RBC 4.44 01/13/2015 1558   RBC 3.64* 12/31/2014 0500   HGB 10.7* 01/13/2015 1558   HGB 9.1* 12/31/2014 0500   HCT 34.2* 01/13/2015 1558   HCT 29.2* 12/31/2014 0500   PLT 495* 01/13/2015 1558   PLT 319 12/31/2014 0500   MCV 77.0* 01/13/2015 1558   MCV 80.2 12/31/2014 0500   MCH 24.1* 01/13/2015 1558   MCH 25.0* 12/31/2014 0500   MCHC 31.2* 01/13/2015 1558   MCHC 31.2 12/31/2014 0500   RDW 15.7* 01/13/2015 1558   RDW 14.9 12/31/2014 0500   LYMPHSABS 1.6 01/13/2015 1558   LYMPHSABS 0.7 12/30/2014 1237   MONOABS 1.2* 01/13/2015 1558   MONOABS 0.9  12/30/2014 1237   EOSABS 0.4 01/13/2015 1558   EOSABS 0.0 12/30/2014 1237   BASOSABS 0.1 01/13/2015 1558   BASOSABS 0.0 12/30/2014 1237     CMP     Component Value Date/Time   NA 139 01/13/2015 1557   NA 137 12/31/2014 0500   K 4.9 01/13/2015 1557   K 3.7 12/31/2014 0500   CL 108 12/31/2014 0500   CO2 24 01/13/2015 1557   CO2 23 12/31/2014 0500   GLUCOSE 75 01/13/2015 1557   GLUCOSE 88 12/31/2014 0500   BUN 30.0* 01/13/2015 1557   BUN 16 12/31/2014 0500   CREATININE 0.8 01/13/2015 1557   CREATININE 0.74 12/31/2014 0500   CALCIUM 9.5 01/13/2015 1557   CALCIUM 8.0* 12/31/2014 0500   PROT 6.6 01/13/2015 1557   PROT 5.4* 12/31/2014 0500   ALBUMIN 2.6* 01/13/2015 1557   ALBUMIN 2.2* 12/31/2014 0500   AST 23 01/13/2015 1557   AST 14* 12/31/2014 0500   ALT 16 01/13/2015 1557   ALT 12* 12/31/2014 0500   ALKPHOS 71 01/13/2015 1557   ALKPHOS 39 12/31/2014 0500   BILITOT <0.30 01/13/2015 1557   BILITOT 0.9 12/31/2014 0500   GFRNONAA >60 12/31/2014 0500   GFRAA >60 12/31/2014 0500  Impression:  The patient is tolerating radiotherapy.   Plan:  Continue radiotherapy as planned.   Paperwork completed for [employer] given to daughter to schedule f/u in 2-3 weeks.  Discussed slow, natural course for healing and recovery over next several months  Maximize nutrition, SLP, PT  -----------------------------------  Eppie Gibson, MD

## 2015-01-20 NOTE — Progress Notes (Signed)
Kenneth Jordan presents for his 33rd fraction of radiation to his tongue and bilateral neck. He reports he has pain in his throat and roof of his mouth a 5/10. He is only taking nutrition through his feeding tube. He states he has no hunger or desire to eat. He takes Nutren 5-6 cans daily, and 16 ounces of free water. He is receiving IVF about every other day per his daughter's report. The skin to his bilateral neck is red, darker in color, and tender. I have encouraged him to use the sonafine cream he was given. He denies any fatigue at this time.   BP 126/76 mmHg  Pulse 95  Temp(Src) 98.9 F (37.2 C)  Ht 5\' 9"  (1.753 m)  Wt 169 lb 3.2 oz (76.749 kg)  BMI 24.98 kg/m2  Orthostatics: Sitting BP 126/76, pulse 95. Standing BP 113/66, pulse 106.  Wt Readings from Last 3 Encounters:  01/20/15 169 lb 3.2 oz (76.749 kg)  01/06/15 168 lb 8 oz (76.431 kg)  01/03/15 168 lb 14 oz (76.6 kg)

## 2015-01-21 ENCOUNTER — Encounter: Payer: Self-pay | Admitting: Hematology and Oncology

## 2015-01-21 ENCOUNTER — Ambulatory Visit: Payer: Non-veteran care | Admitting: Nutrition

## 2015-01-21 ENCOUNTER — Ambulatory Visit (HOSPITAL_BASED_OUTPATIENT_CLINIC_OR_DEPARTMENT_OTHER): Payer: Non-veteran care

## 2015-01-21 ENCOUNTER — Encounter: Payer: Self-pay | Admitting: *Deleted

## 2015-01-21 ENCOUNTER — Ambulatory Visit
Admission: RE | Admit: 2015-01-21 | Discharge: 2015-01-21 | Disposition: A | Discharge status: Home / Self Care | Payer: Non-veteran care | Source: Ambulatory Visit | Attending: Radiation Oncology | Admitting: Radiation Oncology

## 2015-01-21 ENCOUNTER — Ambulatory Visit (HOSPITAL_BASED_OUTPATIENT_CLINIC_OR_DEPARTMENT_OTHER): Payer: Non-veteran care | Admitting: Hematology and Oncology

## 2015-01-21 VITALS — BP 132/62 | HR 97 | Temp 98.6°F | Resp 18

## 2015-01-21 DIAGNOSIS — E43 Unspecified severe protein-calorie malnutrition: Secondary | ICD-10-CM

## 2015-01-21 DIAGNOSIS — R21 Rash and other nonspecific skin eruption: Secondary | ICD-10-CM

## 2015-01-21 DIAGNOSIS — R070 Pain in throat: Secondary | ICD-10-CM

## 2015-01-21 DIAGNOSIS — E46 Unspecified protein-calorie malnutrition: Secondary | ICD-10-CM

## 2015-01-21 DIAGNOSIS — C01 Malignant neoplasm of base of tongue: Secondary | ICD-10-CM

## 2015-01-21 DIAGNOSIS — Z510 Encounter for antineoplastic radiation therapy: Secondary | ICD-10-CM | POA: Diagnosis not present

## 2015-01-21 MED ORDER — SODIUM CHLORIDE 0.9 % IJ SOLN
10.0000 mL | INTRAMUSCULAR | Status: DC | PRN
  Administered 2015-01-21: 10 mL
  Filled 2015-01-21: qty 10

## 2015-01-21 MED ORDER — SODIUM CHLORIDE 0.9 % IV SOLN
Freq: Once | INTRAVENOUS | Status: AC
  Administered 2015-01-21: 13:00:00 via INTRAVENOUS

## 2015-01-21 MED ORDER — HEPARIN SOD (PORK) LOCK FLUSH 100 UNIT/ML IV SOLN
500.0000 [IU] | Freq: Once | INTRAVENOUS | Status: AC | PRN
  Administered 2015-01-21: 500 [IU]
  Filled 2015-01-21: qty 5

## 2015-01-21 NOTE — Assessment & Plan Note (Signed)
The patient tolerated treatment poorly. He is very weak and not interactive. He will continue supportive IV fluids until the end of the week. I have asked my nurse Navigator assistance to arrange further IV fluid support. According to him, that is a plan for placement in skilled nursing facility. I am not sure when to see him back depending on placement options

## 2015-01-21 NOTE — Progress Notes (Signed)
[facility] OFFICE PROGRESS NOTE  Patient Care Team: Pcp Not In System as PCP - General Leota Sauers, RN as Oncology Nurse Navigator Heath Lark, MD as Consulting Physician (Hematology and Oncology) Eppie Gibson, MD as Attending Physician (Radiation Oncology) Karie Mainland, RD as Dietitian (Nutrition)  SUMMARY OF ONCOLOGIC HISTORY: Oncology History   Cancer of base of tongue   Staging form: Lip and Oral Cavity, AJCC 7th Edition     Clinical stage from 11/08/2014: Stage IVA (T4a, N2c, M0) - Signed by Heath Lark, MD on 11/08/2014       Cancer of base of tongue ([place])   07/08/2014 Miscellaneous  the patient noted tongue swelling / mass   10/04/2014 Imaging  CT scan done at the [facility] show large tongue mass with bilateral neck lymphadenopathy   10/07/2014 Miscellaneous  he was seen at the [facility] for evaluation was noted to have tongue cancer   10/10/2014 Imaging  PET CT scan from the [facility] showed extensive regional lymphadenopathy on the left side including supraclavicular lymph node involvement and possibly right jugulodigastric lymph node involvement.   10/24/2014 Procedure  he underwent laryngoscopy and biopsy. there is a large ulcerative tongue a mass on the left involving the tonsil, soft palate, epiglottis.   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 Treatment was stopped due to decline in performance status and various side-effects   12/04/2014 -  Radiation Therapy he received radiation treatment   12/04/2014 Adverse Reaction he had infusion reaction from Erbitux   12/17/2014 Adverse Reaction He is noted to have worsening skin rash and oral mucositis related to side effects of Erbitux. The dose of Erbitux is reduced by 50%   12/30/2014  - 01/03/2015 Hospital Admission He was admitted to the hospital for altered mental status and sepsis. He received antibiotics for UTI and fluconazole for thrush   12/30/2014 Imaging CT Neck w Contrast - 1. Some regression of the large left tongue tumor with involvement of the sublingual space. Tumor margins now indistinct. 2. Stable to slightly increased malignant left level 2 and level 3 nodes since August.     INTERVAL HISTORY: Please see below for problem oriented charting. No history is possible. The patient is sleeping and snoring. According to the nurse Navigator, he will need placement in a skilled facility after completion of treatment as a family member cannot look after him anymore  REVIEW OF SYSTEMS:  Unable to obtain I have reviewed the past medical history, past surgical history, social history and family history with the patient and they are unchanged from previous note.  ALLERGIES:  is allergic to dilaudid and other.  MEDICATIONS:  Current Outpatient Prescriptions  Medication Sig Dispense Refill  . acetaminophen (TYLENOL) 325 MG tablet Take 650 mg by mouth every 6 (six) hours as needed (For pain.).    Marland Kitchen fluticasone (FLONASE) 50 MCG/ACT nasal spray Place 1 spray into the nose every morning.     Marland Kitchen HYDROcodone-acetaminophen (HYCET) 7.5-325 mg/15 ml solution Take 15 mLs by mouth every 6 (six) hours as needed for moderate pain. (Patient not taking: Reported on 01/13/2015) 180 mL 0  . hydrocortisone 1 % ointment Apply 1 application topically 2 (two) times daily. 1 large tube 56 g 0  . lidocaine (  XYLOCAINE) 2 % solution Mix 1 part 2%viscous lidocaine,1part H2O.Swish and/or swallow 25mL of this mixture,36min before meals and at bedtime, up to QID 100 mL 5  . loratadine (CLARITIN) 10 MG tablet Take 10 mg by mouth daily as needed for allergies.    . Menthol, Topical Analgesic, 16 % LIQD Apply 1 application topically daily. Applies for shoulder pain.    Marland Kitchen metoCLOPramide (REGLAN) 10 MG  tablet Take 1 tablet (10 mg total) by mouth 4 (four) times daily -  before meals and at bedtime. 40 tablet 1  . morphine (ROXANOL) 20 MG/ML concentrated solution Place 0.5 mLs (10 mg total) into feeding tube every 2 (two) hours as needed for severe pain. 240 mL 0  . omeprazole (PRILOSEC) 20 MG capsule Take 20 mg by mouth at bedtime as needed (For heartburn or acid reflux.).     Marland Kitchen PRESCRIPTION MEDICATION Patient receives his chemo treatments at the [facility] with Dr. Alvy Bimler. He is receiving Cetuximab every 7 days. His last dose was on 12/25/14.    . sodium fluoride (FLUORISHIELD) 1.1 % GEL dental gel Instill one drop of gel per tooth space of fluoride tray. Place over teeth for 5 minutes. Remove. Spit out excess. Repeat nightly (Patient not taking: Reported on 01/13/2015) 120 mL prn  . sucralfate (CARAFATE) 1 GM/10ML suspension Take 1 g by mouth 4 (four) times daily as needed (For sore throat.).    Marland Kitchen zolpidem (AMBIEN) 10 MG tablet Take 0.5 tablets (5 mg total) by mouth at bedtime. (Patient not taking: Reported on 01/13/2015) 30 tablet 0   No current facility-administered medications for this visit.   Facility-Administered Medications Ordered in Other Visits  Medication Dose Route Frequency Provider Last Rate Last Dose  . heparin lock flush 100 unit/mL  500 Units Intracatheter Once PRN Heath Lark, MD      . sodium chloride 0.9 % injection 10 mL  10 mL Intracatheter PRN Heath Lark, MD        PHYSICAL EXAMINATION: ECOG PERFORMANCE STATUS: 3 - Symptomatic, >50% confined to bed GENERAL:alert, no distress and comfortable. He is sleeping and snoring SKIN: Significant skin rashes noted around his neck Musculoskeletal:no cyanosis of digits and no clubbing  NEURO: Patient is sleeping unable to get meaningful history, no focal motor/sensory deficits  LABORATORY DATA:  I have reviewed the data as listed    Component Value Date/Time   NA 139 01/13/2015 1557   NA 137  12/31/2014 0500   K 4.9 01/13/2015 1557   K 3.7 12/31/2014 0500   CL 108 12/31/2014 0500   CO2 24 01/13/2015 1557   CO2 23 12/31/2014 0500   GLUCOSE 75 01/13/2015 1557   GLUCOSE 88 12/31/2014 0500   BUN 30.0* 01/13/2015 1557   BUN 16 12/31/2014 0500   CREATININE 0.8 01/13/2015 1557   CREATININE 0.74 12/31/2014 0500   CALCIUM 9.5 01/13/2015 1557   CALCIUM 8.0* 12/31/2014 0500   PROT 6.6 01/13/2015 1557   PROT 5.4* 12/31/2014 0500   ALBUMIN 2.6* 01/13/2015 1557   ALBUMIN 2.2* 12/31/2014 0500   AST 23 01/13/2015 1557   AST 14* 12/31/2014 0500   ALT 16 01/13/2015 1557   ALT 12* 12/31/2014 0500   ALKPHOS 71 01/13/2015 1557   ALKPHOS 39 12/31/2014 0500   BILITOT <0.30 01/13/2015 1557   BILITOT 0.9 12/31/2014 0500   GFRNONAA >60 12/31/2014 0500   GFRAA >60 12/31/2014 0500    No results found for: SPEP, UPEP  Lab  Results  Component Value Date   WBC 9.2 01/13/2015   NEUTROABS 6.0 01/13/2015   HGB 10.7* 01/13/2015   HCT 34.2* 01/13/2015   MCV 77.0* 01/13/2015   PLT 495* 01/13/2015      Chemistry      Component Value Date/Time   NA 139 01/13/2015 1557   NA 137 12/31/2014 0500   K 4.9 01/13/2015 1557   K 3.7 12/31/2014 0500   CL 108 12/31/2014 0500   CO2 24 01/13/2015 1557   CO2 23 12/31/2014 0500   BUN 30.0* 01/13/2015 1557   BUN 16 12/31/2014 0500   CREATININE 0.8 01/13/2015 1557   CREATININE 0.74 12/31/2014 0500      Component Value Date/Time   CALCIUM 9.5 01/13/2015 1557   CALCIUM 8.0* 12/31/2014 0500   ALKPHOS 71 01/13/2015 1557   ALKPHOS 39 12/31/2014 0500   AST 23 01/13/2015 1557   AST 14* 12/31/2014 0500   ALT 16 01/13/2015 1557   ALT 12* 12/31/2014 0500   BILITOT <0.30 01/13/2015 1557   BILITOT 0.9 12/31/2014 0500       ASSESSMENT & PLAN:  Cancer of base of tongue ([place]) The patient tolerated treatment poorly. He is very weak and not interactive. He will continue supportive IV fluids until the end of the week. I have asked my nurse Navigator  assistance to arrange further IV fluid support. According to him, that is a plan for placement in skilled nursing facility. I am not sure when to see him back depending on placement options  Protein calorie malnutrition ([place])  He had significant protein calorie malnutrition. We will continue to maintain nutrition through his feeding tube and I will provide IV fluid support daily.      No orders of the defined types were placed in this encounter.   All questions were answered. The patient knows to call the clinic with any problems, questions or concerns. No barriers to learning was detected. I spent 15 minutes counseling the patient face to face. The total time spent in the appointment was 20 minutes and more than 50% was on counseling and review of test results     [facility], [place], MD 01/21/2015 3:01 PM

## 2015-01-21 NOTE — Patient Instructions (Signed)

## 2015-01-21 NOTE — Progress Notes (Signed)
  Oncology Nurse Navigator Documentation   Navigator Encounter Type: Treatment (01/21/15 1415) Patient Visit Type: Medonc (IVF) (01/21/15 1415)     Met with patient in Infusion to check on his well-being.  He was accompanied by his niece.  He recognized that tomorrow is his last day of RT.  We discussed post-tmt activities, including daily IVF and weekly follow-up appts with Drs. Isidore Moos and Swink. He understands the need to stay in [place] to accommodate these appts and his immediate post-tmt recovery. I will have further conversation with his dtrs tomorrow re [facility].  [name], RN, BSN, [place] at [place] [phone]                   Time Spent with Patient: 30 (01/21/15 1415)

## 2015-01-21 NOTE — Progress Notes (Signed)
Brief follow-up with patient's family member during IV fluids. Patient was sound asleep. Apparently patient is not eating much food or drinking water. Patient continues to tolerate tube feedings and free water flushes. Weight has improved and was documented as 169.2 pounds November 21.  Nutrition diagnosis: Inadequate oral intake continues.  Intervention: I encouraged family member to communicate importance of tube feedings 6 bottles daily. Recommended patient continue to try to drink fluids by mouth if safe. Encouraged family to contact me questions or concerns.  Monitoring, evaluation, goals: Patient should continue tube feedings and free water flushes to meet greater than 90% of estimated needs.  Diet to advance as tolerated and safe.  Next visit: To be scheduled as needed.  **Disclaimer: This note was dictated with voice recognition software. Similar sounding words can inadvertently be transcribed and this note may contain transcription errors which may not have been corrected upon publication of note.**

## 2015-01-21 NOTE — Assessment & Plan Note (Signed)
He had significant protein calorie malnutrition. We will continue to maintain nutrition through his feeding tube and I will provide IV fluid support daily.

## 2015-01-22 ENCOUNTER — Encounter: Payer: Self-pay | Admitting: Radiation Oncology

## 2015-01-22 ENCOUNTER — Ambulatory Visit
Admission: RE | Admit: 2015-01-22 | Discharge: 2015-01-22 | Disposition: A | Discharge status: Home / Self Care | Payer: Non-veteran care | Source: Ambulatory Visit | Attending: Radiation Oncology | Admitting: Radiation Oncology

## 2015-01-22 ENCOUNTER — Telehealth: Payer: Self-pay | Admitting: *Deleted

## 2015-01-22 ENCOUNTER — Other Ambulatory Visit: Payer: Self-pay | Admitting: *Deleted

## 2015-01-22 ENCOUNTER — Ambulatory Visit (HOSPITAL_BASED_OUTPATIENT_CLINIC_OR_DEPARTMENT_OTHER): Payer: Non-veteran care

## 2015-01-22 VITALS — BP 142/66 | HR 103 | Temp 98.9°F | Resp 18

## 2015-01-22 DIAGNOSIS — C01 Malignant neoplasm of base of tongue: Secondary | ICD-10-CM

## 2015-01-22 DIAGNOSIS — R070 Pain in throat: Secondary | ICD-10-CM | POA: Diagnosis not present

## 2015-01-22 DIAGNOSIS — R21 Rash and other nonspecific skin eruption: Secondary | ICD-10-CM

## 2015-01-22 DIAGNOSIS — Z510 Encounter for antineoplastic radiation therapy: Secondary | ICD-10-CM | POA: Diagnosis not present

## 2015-01-22 MED ORDER — SODIUM CHLORIDE 0.9 % IV SOLN
Freq: Once | INTRAVENOUS | Status: AC
  Administered 2015-01-22: 14:00:00 via INTRAVENOUS

## 2015-01-22 MED ORDER — HEPARIN SOD (PORK) LOCK FLUSH 100 UNIT/ML IV SOLN
500.0000 [IU] | Freq: Once | INTRAVENOUS | Status: AC | PRN
  Administered 2015-01-22: 500 [IU]
  Filled 2015-01-22: qty 5

## 2015-01-22 MED ORDER — SODIUM CHLORIDE 0.9 % IJ SOLN
10.0000 mL | INTRAMUSCULAR | Status: DC | PRN
Start: 2015-01-22 — End: 2015-01-22
  Administered 2015-01-22: 10 mL
  Filled 2015-01-22: qty 10

## 2015-01-22 NOTE — Patient Instructions (Signed)

## 2015-01-22 NOTE — Telephone Encounter (Addendum)
  Oncology Nurse Navigator Documentation   Navigator Encounter Type: Telephone (01/22/15 1128)         Interventions: Coordination of Care (01/22/15 1128)     Patient dtr Kenneth Jordan returned my VMM:  Indicated she can bring her dad in for 1:00 IVF appt at [facility].  She verbalized understanding IVF appts are being scheduled for the next couple of weeks, appts will likely be at [facility] clinic next week.  She shared that [facility] in [place].  She noted her dad is currently being seen once weekly by Miguel Rota RN and Miguel Rota PT.  Rn with Mcarthur Rossetti is arranging for a replacement of PEG extension.  [name], RN, BSN, [place] at [place] [phone]           Time Spent with Patient: 15 (01/22/15 1128)

## 2015-01-24 ENCOUNTER — Ambulatory Visit (HOSPITAL_BASED_OUTPATIENT_CLINIC_OR_DEPARTMENT_OTHER): Payer: Non-veteran care

## 2015-01-24 VITALS — BP 131/69 | HR 98 | Temp 98.4°F

## 2015-01-24 DIAGNOSIS — E46 Unspecified protein-calorie malnutrition: Secondary | ICD-10-CM | POA: Diagnosis not present

## 2015-01-24 DIAGNOSIS — C01 Malignant neoplasm of base of tongue: Secondary | ICD-10-CM

## 2015-01-24 DIAGNOSIS — R070 Pain in throat: Secondary | ICD-10-CM

## 2015-01-24 DIAGNOSIS — R21 Rash and other nonspecific skin eruption: Secondary | ICD-10-CM

## 2015-01-24 MED ORDER — SODIUM CHLORIDE 0.9 % IV SOLN
Freq: Once | INTRAVENOUS | Status: AC
  Administered 2015-01-24: 13:00:00 via INTRAVENOUS

## 2015-01-24 MED ORDER — SODIUM CHLORIDE 0.9 % IJ SOLN
10.0000 mL | INTRAMUSCULAR | Status: DC | PRN
  Administered 2015-01-24: 10 mL
  Filled 2015-01-24: qty 10

## 2015-01-24 MED ORDER — HEPARIN SOD (PORK) LOCK FLUSH 100 UNIT/ML IV SOLN
500.0000 [IU] | Freq: Once | INTRAVENOUS | Status: AC | PRN
  Administered 2015-01-24: 500 [IU]
  Filled 2015-01-24: qty 5

## 2015-01-24 NOTE — Patient Instructions (Signed)

## 2015-01-27 ENCOUNTER — Ambulatory Visit: Payer: Self-pay

## 2015-01-27 ENCOUNTER — Telehealth: Payer: Self-pay | Admitting: Hematology and Oncology

## 2015-01-27 ENCOUNTER — Encounter: Payer: Self-pay | Admitting: *Deleted

## 2015-01-27 ENCOUNTER — Ambulatory Visit: Payer: Medicare PPO

## 2015-01-27 ENCOUNTER — Ambulatory Visit: Payer: Non-veteran care

## 2015-01-27 ENCOUNTER — Telehealth: Payer: Self-pay | Admitting: *Deleted

## 2015-01-27 ENCOUNTER — Other Ambulatory Visit: Payer: Self-pay | Admitting: *Deleted

## 2015-01-27 DIAGNOSIS — C01 Malignant neoplasm of base of tongue: Secondary | ICD-10-CM

## 2015-01-27 NOTE — Telephone Encounter (Signed)
  Oncology Nurse Navigator Documentation   Navigator Encounter Type: Telephone (01/27/15 1000)         Interventions: Coordination of Care (01/27/15 1000)       Spoke with pt's dtr Kenneth Jordan to check on his well being.  She indicated:  He is "having a good day today, actually got himself up this morning".    Slept most of yesterday "about 20 hrs".   In response to my inquiry, she indicated she thinks he is alert and interactive enough to keep his appt with Garald Balding, SLP, at [facility] this afternoon. I explained that an appt for today's IVF hasn't been established yet, that I would call her as soon as scheduled.  She verbalized understanding.  [name], RN, BSN, [place] at [place] [phone]         Time Spent with Patient: 15 (01/27/15 1000)

## 2015-01-27 NOTE — Telephone Encounter (Signed)
Had an order to add patient for ivf at [facility] 11/29 as they were closed Friday and capped today.  (i had a class...apoke with tammi  And she states that Kenneth Jordan has worked with him today and feels he is ok and will get ivf Thursday)...anne

## 2015-01-27 NOTE — Telephone Encounter (Signed)
  Oncology Nurse Navigator Documentation   Navigator Encounter Type: Telephone (01/27/15 1311)         Interventions: Coordination of Care (01/27/15 1311)     Spoke with patient's dtr [name].  Per Garald Balding, explained that b/c her dad is being seen by Miguel Rota PT, he is unable to provide SLP.  He recommends that she contact Miguel Rota and request SLP with Jamelle Haring.  She stated she cannot make 3:30 IVF at [facility] d/t other afternoon obligation.  I cancelled appt.  I informed that do not have an IVF appt for today as of our conversation, that I will contact her if an appt available within the next couple of hours.  I encouraged her to instill as much water as possible today but not at the expense of nutritional supplement.    She understands remaining appts for the week are pending, that I will contact her when made.  She noted she can bring him as early as 2:30 Tues and Friday of this week, anytime Wed.  [name], RN, BSN, [place] at [place] [phone]            Time Spent with Patient: 30 (01/27/15 1311)

## 2015-01-28 ENCOUNTER — Encounter: Payer: Self-pay | Admitting: *Deleted

## 2015-01-28 ENCOUNTER — Encounter (HOSPITAL_COMMUNITY): Payer: Self-pay | Admitting: Emergency Medicine

## 2015-01-28 ENCOUNTER — Emergency Department (HOSPITAL_COMMUNITY): Payer: Medicare PPO

## 2015-01-28 ENCOUNTER — Telehealth: Payer: Self-pay | Admitting: *Deleted

## 2015-01-28 ENCOUNTER — Other Ambulatory Visit: Payer: Self-pay | Admitting: Hematology and Oncology

## 2015-01-28 ENCOUNTER — Ambulatory Visit: Payer: Self-pay

## 2015-01-28 ENCOUNTER — Telehealth: Payer: Self-pay | Admitting: Hematology and Oncology

## 2015-01-28 ENCOUNTER — Inpatient Hospital Stay (HOSPITAL_COMMUNITY)
Admission: EM | Admit: 2015-01-28 | Discharge: 2015-02-03 | DRG: 871 | Disposition: A | Discharge status: Skilled Nursing Facility | Payer: Medicare PPO | Attending: Internal Medicine | Admitting: Internal Medicine

## 2015-01-28 DIAGNOSIS — R296 Repeated falls: Secondary | ICD-10-CM | POA: Diagnosis present

## 2015-01-28 DIAGNOSIS — H3552 Pigmentary retinal dystrophy: Secondary | ICD-10-CM | POA: Diagnosis present

## 2015-01-28 DIAGNOSIS — E46 Unspecified protein-calorie malnutrition: Secondary | ICD-10-CM | POA: Diagnosis not present

## 2015-01-28 DIAGNOSIS — Z9221 Personal history of antineoplastic chemotherapy: Secondary | ICD-10-CM

## 2015-01-28 DIAGNOSIS — C01 Malignant neoplasm of base of tongue: Secondary | ICD-10-CM | POA: Diagnosis present

## 2015-01-28 DIAGNOSIS — T451X5A Adverse effect of antineoplastic and immunosuppressive drugs, initial encounter: Secondary | ICD-10-CM | POA: Diagnosis present

## 2015-01-28 DIAGNOSIS — J189 Pneumonia, unspecified organism: Secondary | ICD-10-CM | POA: Diagnosis present

## 2015-01-28 DIAGNOSIS — Z8249 Family history of ischemic heart disease and other diseases of the circulatory system: Secondary | ICD-10-CM

## 2015-01-28 DIAGNOSIS — I10 Essential (primary) hypertension: Secondary | ICD-10-CM | POA: Diagnosis present

## 2015-01-28 DIAGNOSIS — R000 Tachycardia, unspecified: Secondary | ICD-10-CM | POA: Diagnosis present

## 2015-01-28 DIAGNOSIS — G9341 Metabolic encephalopathy: Secondary | ICD-10-CM | POA: Diagnosis present

## 2015-01-28 DIAGNOSIS — E871 Hypo-osmolality and hyponatremia: Secondary | ICD-10-CM | POA: Diagnosis present

## 2015-01-28 DIAGNOSIS — R6520 Severe sepsis without septic shock: Secondary | ICD-10-CM | POA: Diagnosis present

## 2015-01-28 DIAGNOSIS — D6481 Anemia due to antineoplastic chemotherapy: Secondary | ICD-10-CM | POA: Diagnosis present

## 2015-01-28 DIAGNOSIS — A419 Sepsis, unspecified organism: Secondary | ICD-10-CM | POA: Diagnosis not present

## 2015-01-28 DIAGNOSIS — R4182 Altered mental status, unspecified: Secondary | ICD-10-CM | POA: Insufficient documentation

## 2015-01-28 DIAGNOSIS — Z515 Encounter for palliative care: Secondary | ICD-10-CM | POA: Diagnosis not present

## 2015-01-28 DIAGNOSIS — Z09 Encounter for follow-up examination after completed treatment for conditions other than malignant neoplasm: Secondary | ICD-10-CM

## 2015-01-28 DIAGNOSIS — M1990 Unspecified osteoarthritis, unspecified site: Secondary | ICD-10-CM | POA: Diagnosis present

## 2015-01-28 DIAGNOSIS — F330 Major depressive disorder, recurrent, mild: Secondary | ICD-10-CM | POA: Diagnosis present

## 2015-01-28 DIAGNOSIS — Z7189 Other specified counseling: Secondary | ICD-10-CM

## 2015-01-28 DIAGNOSIS — G9340 Encephalopathy, unspecified: Secondary | ICD-10-CM | POA: Diagnosis present

## 2015-01-28 DIAGNOSIS — Z808 Family history of malignant neoplasm of other organs or systems: Secondary | ICD-10-CM

## 2015-01-28 DIAGNOSIS — Y95 Nosocomial condition: Secondary | ICD-10-CM | POA: Diagnosis present

## 2015-01-28 DIAGNOSIS — N390 Urinary tract infection, site not specified: Secondary | ICD-10-CM | POA: Diagnosis present

## 2015-01-28 DIAGNOSIS — Z66 Do not resuscitate: Secondary | ICD-10-CM | POA: Diagnosis present

## 2015-01-28 DIAGNOSIS — K219 Gastro-esophageal reflux disease without esophagitis: Secondary | ICD-10-CM | POA: Diagnosis present

## 2015-01-28 DIAGNOSIS — G4700 Insomnia, unspecified: Secondary | ICD-10-CM | POA: Diagnosis present

## 2015-01-28 DIAGNOSIS — Z931 Gastrostomy status: Secondary | ICD-10-CM

## 2015-01-28 DIAGNOSIS — Z923 Personal history of irradiation: Secondary | ICD-10-CM

## 2015-01-28 LAB — BASIC METABOLIC PANEL
Anion gap: 7 (ref 5–15)
BUN: 31 mg/dL — AB (ref 6–20)
CHLORIDE: 97 mmol/L — AB (ref 101–111)
CO2: 27 mmol/L (ref 22–32)
CREATININE: 0.9 mg/dL (ref 0.61–1.24)
Calcium: 9 mg/dL (ref 8.9–10.3)
Glucose, Bld: 104 mg/dL — ABNORMAL HIGH (ref 65–99)
POTASSIUM: 4.8 mmol/L (ref 3.5–5.1)
SODIUM: 131 mmol/L — AB (ref 135–145)

## 2015-01-28 LAB — URINE MICROSCOPIC-ADD ON: BACTERIA UA: NONE SEEN

## 2015-01-28 LAB — I-STAT TROPONIN, ED: Troponin i, poc: 0.01 ng/mL (ref 0.00–0.08)

## 2015-01-28 LAB — URINALYSIS, ROUTINE W REFLEX MICROSCOPIC
BILIRUBIN URINE: NEGATIVE
GLUCOSE, UA: NEGATIVE mg/dL
KETONES UR: NEGATIVE mg/dL
Nitrite: NEGATIVE
PROTEIN: 30 mg/dL — AB
Specific Gravity, Urine: 1.019 (ref 1.005–1.030)
pH: 7 (ref 5.0–8.0)

## 2015-01-28 LAB — CBC
HCT: 33.9 % — ABNORMAL LOW (ref 39.0–52.0)
Hemoglobin: 10.4 g/dL — ABNORMAL LOW (ref 13.0–17.0)
MCH: 24.5 pg — ABNORMAL LOW (ref 26.0–34.0)
MCHC: 30.7 g/dL (ref 30.0–36.0)
MCV: 80 fL (ref 78.0–100.0)
PLATELETS: 384 10*3/uL (ref 150–400)
RBC: 4.24 MIL/uL (ref 4.22–5.81)
RDW: 16 % — AB (ref 11.5–15.5)
WBC: 7.6 10*3/uL (ref 4.0–10.5)

## 2015-01-28 LAB — I-STAT CG4 LACTIC ACID, ED
LACTIC ACID, VENOUS: 1.01 mmol/L (ref 0.5–2.0)
Lactic Acid, Venous: 0.94 mmol/L (ref 0.5–2.0)

## 2015-01-28 MED ORDER — DEXTROSE 5 % IV SOLN
1.0000 g | INTRAVENOUS | Status: DC
Start: 2015-01-29 — End: 2015-01-29
  Administered 2015-01-29: 1 g via INTRAVENOUS
  Filled 2015-01-28: qty 10

## 2015-01-28 MED ORDER — CLINDAMYCIN PHOSPHATE 1 % EX GEL
1.0000 "application " | Freq: Two times a day (BID) | CUTANEOUS | Status: DC
  Administered 2015-01-30 – 2015-02-03 (×8): 1 via TOPICAL

## 2015-01-28 MED ORDER — LIDOCAINE-PRILOCAINE 2.5-2.5 % EX CREA
1.0000 "application " | TOPICAL_CREAM | CUTANEOUS | Status: DC | PRN
  Filled 2015-01-28: qty 5

## 2015-01-28 MED ORDER — MORPHINE SULFATE 10 MG/5ML PO SOLN: 10.0000 mg | ORAL | Status: DC | PRN

## 2015-01-28 MED ORDER — SODIUM CHLORIDE 0.9 % IV SOLN
INTRAVENOUS | Status: DC
  Administered 2015-01-29 – 2015-01-30 (×2): via INTRAVENOUS

## 2015-01-28 MED ORDER — DEXTROSE 5 % IV SOLN
1.0000 g | Freq: Once | INTRAVENOUS | Status: AC
  Administered 2015-01-28: 1 g via INTRAVENOUS
  Filled 2015-01-28: qty 10

## 2015-01-28 MED ORDER — SODIUM CHLORIDE 0.9 % IV BOLUS (SEPSIS)
1000.0000 mL | Freq: Once | INTRAVENOUS | Status: AC
  Administered 2015-01-28: 1000 mL via INTRAVENOUS

## 2015-01-28 MED ORDER — ACETAMINOPHEN 325 MG PO TABS: 650.0000 mg | ORAL_TABLET | Freq: Four times a day (QID) | ORAL | Status: DC | PRN

## 2015-01-28 MED ORDER — MUSCLE RUB 10-15 % EX CREA
1.0000 "application " | TOPICAL_CREAM | Freq: Every day | CUTANEOUS | Status: DC
  Administered 2015-01-30 – 2015-02-03 (×5): 1 via TOPICAL
  Filled 2015-01-28 (×2): qty 85

## 2015-01-28 MED ORDER — HYDROCORTISONE 1 % EX OINT
1.0000 "application " | TOPICAL_OINTMENT | Freq: Two times a day (BID) | CUTANEOUS | Status: DC
  Administered 2015-01-29 – 2015-02-03 (×11): 1 via TOPICAL
  Filled 2015-01-28 (×2): qty 28.35

## 2015-01-28 MED ORDER — PANTOPRAZOLE SODIUM 40 MG PO TBEC: 40.0000 mg | DELAYED_RELEASE_TABLET | Freq: Every day | ORAL | Status: DC

## 2015-01-28 MED ORDER — SUCRALFATE 1 GM/10ML PO SUSP
1.0000 g | Freq: Four times a day (QID) | ORAL | Status: DC | PRN
  Filled 2015-01-28: qty 10

## 2015-01-28 MED ORDER — ENSURE ENLIVE PO LIQD
237.0000 mL | Freq: Two times a day (BID) | ORAL | Status: DC
  Administered 2015-01-30: 237 mL via ORAL

## 2015-01-28 MED ORDER — METOCLOPRAMIDE HCL 10 MG PO TABS: 10.0000 mg | ORAL_TABLET | Freq: Three times a day (TID) | ORAL | Status: DC

## 2015-01-28 MED ORDER — SODIUM CHLORIDE 0.9 % IV BOLUS (SEPSIS)
1500.0000 mL | Freq: Once | INTRAVENOUS | Status: AC
  Administered 2015-01-29: 1500 mL via INTRAVENOUS

## 2015-01-28 MED ORDER — LORATADINE 10 MG PO TABS
10.0000 mg | ORAL_TABLET | Freq: Every day | ORAL | Status: DC | PRN
  Filled 2015-01-28: qty 1

## 2015-01-28 MED ORDER — SODIUM CHLORIDE 0.9 % IV SOLN: INTRAVENOUS | Status: DC

## 2015-01-28 NOTE — ED Provider Notes (Signed)
CSN: [id number]     Arrival date & time 01/28/15  1512 History   First MD Initiated Contact with Patient 01/28/15 1528     Chief Complaint  Patient presents with  . elevated HR   . AMS      (Consider location/radiation/quality/duration/timing/severity/associated sxs/prior Treatment) HPI  The patient is a 79 year old male, he has a history of tongue cancer diagnosed in August of this year, hypertension, he is currently undergoing chemotherapy as well as radiation treatments of his tongue, last had radiation last week. He has had a gastric tube placed for food, there has been no discussion with the family regarding breathing or trach placement or CODE STATUS. According to the daughter the patient has had a couple of days of worsening mental status including feeling like he is hallucinating, saying off-the-wall things, he is tachycardic and tachypneic. He has been unable to have adequate care at home as the daughter has other responsibilities in a home that is not set up for a patient with his medical requirements. He is becoming extremely weak and is having frequent falls including in the shower which took the daughter approximately 30 minutes just to get him out of the shower. There is no obvious vomiting, fevers, diarrhea, rashes or coughing. The daughter states he just does not look like himself today  Past Medical History  Diagnosis Date  . 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 Hypertension   . Retinitis pigmentosa of both eyes   . Anxiety   . Depression   . GERD (gastroesophageal reflux disease)     hx of   . Arthritis     shoulders, right knee   . Insomnia 12/24/2014   Past Surgical History  Procedure Laterality Date  . Biopsy tongue  10/24/14  . Knee surgery Right   . Tonsilectomy, adenoidectomy, bilateral myringotomy and tubes    . Feedign tube srugery     . Multiple extractions with alveoloplasty N/A 11/20/2014    Procedure: Extraction of tooth #'s 1,15,16,17,20,31  with alveoloplasty and gross debridement of remaining teeth;  Surgeon: Lenn Cal, DDS;  Location: [facility];  Service: Oral Surgery;  Laterality: N/A;   Family History  Problem Relation Age of Onset  . Ovarian cancer Sister   . Heart attack Father   . Breast cancer      neice on Sister's side.  Also had tongue cancer.   Social History  Substance Use Topics  . Smoking status: Never Smoker   . Smokeless tobacco: Never Used  . Alcohol Use: No     Comment: hx of occ drink     Review of Systems  Unable to perform ROS: Mental status change      Allergies  Dilaudid and Other  Home Medications   Prior to Admission medications   Medication Sig Start Date End Date Taking? Authorizing Provider  acetaminophen (TYLENOL) 325 MG tablet Take 650 mg by mouth every 6 (six) hours as needed (For pain.).   Yes Historical Provider, MD  clindamycin (CLINDAGEL) 1 % gel Apply 1 application topically as directed. 12/17/14  Yes Historical Provider, MD  hydrocortisone 1 % ointment Apply 1 application topically 2 (two) times daily. 1 large tube 12/10/14  Yes Ni Gorsuch, MD  lidocaine (XYLOCAINE) 2 % solution Mix 1 part 2%viscous lidocaine,1part H2O.Swish and/or swallow 72mL of this mixture,32min before meals and at bedtime, up to QID 12/09/14  Yes Eppie Gibson, MD  lidocaine-prilocaine (EMLA) cream Apply  1 application topically as needed. 12/02/14  Yes Historical Provider, MD  loratadine (CLARITIN) 10 MG tablet Take 10 mg by mouth daily as needed for allergies.   Yes Historical Provider, MD  Menthol, Topical Analgesic, 16 % LIQD Apply 1 application topically daily. Applies for shoulder pain.   Yes Historical Provider, MD  metoCLOPramide (REGLAN) 10 MG tablet Take 1 tablet (10 mg total) by mouth 4 (four) times daily -  before meals and at bedtime. 12/18/14  Yes Heath Lark, MD  morphine (ROXANOL) 20 MG/ML concentrated solution Place 0.5 mLs (10 mg total) into feeding tube every 2 (two) hours as needed for  severe pain. 12/17/14  Yes Heath Lark, MD  omeprazole (PRILOSEC) 20 MG capsule Take 20 mg by mouth at bedtime as needed (For heartburn or acid reflux.).    Yes Historical Provider, MD  sucralfate (CARAFATE) 1 GM/10ML suspension Take 1 g by mouth 4 (four) times daily as needed (For sore throat.).   Yes Historical Provider, MD  HYDROcodone-acetaminophen (HYCET) 7.5-325 mg/15 ml solution Take 15 mLs by mouth every 6 (six) hours as needed for moderate pain. Patient not taking: Reported on 01/13/2015 11/20/14 11/20/15  Lenn Cal, DDS  sodium fluoride (FLUORISHIELD) 1.1 % GEL dental gel Instill one drop of gel per tooth space of fluoride tray. Place over teeth for 5 minutes. Remove. Spit out excess. Repeat nightly Patient not taking: Reported on 01/13/2015 11/27/14   Lenn Cal, DDS  zolpidem (AMBIEN) 10 MG tablet Take 0.5 tablets (5 mg total) by mouth at bedtime. Patient not taking: Reported on 01/13/2015 12/24/14   Ni Gorsuch, MD   BP 140/82 mmHg  Pulse 101  Temp(Src) 98.8 F (37.1 C) (Rectal)  Resp 24  Wt 170 lb (77.111 kg)  SpO2 97% Physical Exam  Constitutional: He appears well-developed and well-nourished. He appears distressed.  HENT:  Head: Normocephalic and atraumatic.  Mouth/Throat: Oropharynx is clear and moist. No oropharyngeal exudate.  Thick mucopurulent discharge in the mouth, dry mucous membranes, no obvious masses obstructing the airway, phonation appears normal  Eyes: Conjunctivae and EOM are normal. Pupils are equal, round, and reactive to light. Right eye exhibits no discharge. Left eye exhibits no discharge. No scleral icterus.  Neck: Normal range of motion. Neck supple. No JVD present. No thyromegaly present.  Thickened skin and swelling in the sub-mandibular area left greater than right  Cardiovascular: Regular rhythm, normal heart sounds and intact distal pulses.  Exam reveals no gallop and no friction rub.   No murmur heard. Tachycardia to 115, strong  pulses, no edema, no JVD  Pulmonary/Chest: He is in respiratory distress. He has no wheezes. He has rales.  Increased work of breathing, tachypnea, scattered rales, rhonchi present, no wheezing, no accessory muscle use  Abdominal: Soft. Bowel sounds are normal. He exhibits no distension and no mass. There is no tenderness.  No abdominal tenderness or masses  Musculoskeletal: Normal range of motion. He exhibits no edema or tenderness.  No edema, moves all extremities 4, able to assist in sitting up  Lymphadenopathy:    He has no cervical adenopathy.  Neurological: He is alert. Coordination normal.  Follows simple commands, saying things that don't make sense,  Skin: Skin is warm and dry. No rash noted. No erythema.  Psychiatric: He has a normal mood and affect. His behavior is normal.  Nursing note and vitals reviewed.   ED Course  Procedures (including critical care time) Labs Review Labs Reviewed  BASIC METABOLIC PANEL - Abnormal;  Notable for the following:    Sodium 131 (*)    Chloride 97 (*)    Glucose, Bld 104 (*)    BUN 31 (*)    All other components within normal limits  CBC - Abnormal; Notable for the following:    Hemoglobin 10.4 (*)    HCT 33.9 (*)    MCH 24.5 (*)    RDW 16.0 (*)    All other components within normal limits  URINALYSIS, ROUTINE W REFLEX MICROSCOPIC (NOT AT [facility]) - Abnormal; Notable for the following:    APPearance CLOUDY (*)    Hgb urine dipstick TRACE (*)    Protein, ur 30 (*)    Leukocytes, UA SMALL (*)    All other components within normal limits  URINE MICROSCOPIC-ADD ON - Abnormal; Notable for the following:    Squamous Epithelial / LPF 0-5 (*)    All other components within normal limits  CULTURE, BLOOD (ROUTINE X 2)  CULTURE, BLOOD (ROUTINE X 2)  URINE CULTURE  I-STAT TROPOININ, ED  I-STAT CG4 LACTIC ACID, ED  I-STAT CG4 LACTIC ACID, ED    Imaging Review Ct Head Wo Contrast  01/28/2015  CLINICAL DATA:  79 year old male with altered  mental status. History of tongue cancer. EXAM: CT HEAD WITHOUT CONTRAST TECHNIQUE: Contiguous axial images were obtained from the base of the skull through the vertex without intravenous contrast. COMPARISON:  Head CT dated 09/30/2014 FINDINGS: The ventricles are dilated and the sulci are prominent compatible with age-related atrophy. Periventricular and deep white matter hypodensities represent chronic microvascular ischemic changes. There is no intracranial hemorrhage. No mass effect or midline shift identified. The visualized paranasal sinuses are well aerated. There is minimal mucoperiosteal thickening of the mastoid air cells. Multiple calvarial lucencies are stable compared to the prior study and of indeterminate clinical significance. IMPRESSION: No acute intracranial pathology. Age-related atrophy and chronic microvascular ischemic disease. If symptoms persist and there are no contraindications, MRI may provide better evaluation if clinically indicated. Electronically Signed   By: Anner Crete M.D.   On: 01/28/2015 20:50   Dg Chest Port 1 View  01/28/2015  CLINICAL DATA:  Cancer base of tongue.  Short of breath. EXAM: PORTABLE CHEST 1 VIEW COMPARISON:  12/30/2014 FINDINGS: Hypoventilation with decreased lung volume. Increase in bibasilar atelectasis. Both negative for heart failure or pneumonia or effusion Right jugular Port-A-Cath tip in the SVC Moderate to advanced degenerative change in both shoulder joints. IMPRESSION: Hypoventilation with bibasilar atelectasis. Electronically Signed   By: Franchot Gallo M.D.   On: 01/28/2015 16:22   I have personally reviewed and evaluated these images and lab results as part of my medical decision-making.   EKG Interpretation   Date/Time:  Tuesday January 28 2015 15:24:53 EST Ventricular Rate:  109 PR Interval:  128 QRS Duration: 107 QT Interval:  352 QTC Calculation: 474 R Axis:   -52 Text Interpretation:  Sinus tachycardia LAD, consider left  anterior  fascicular block Abnormal R-wave progression, late transition LVH with  secondary repolarization abnormality Since last tracing rate faster  Confirmed by Deionna Marcantonio  MD, Makaylie Dedeaux ([id number]) on 01/28/2015 3:53:09 PM      MDM   Final diagnoses:  Altered mental status, unspecified altered mental status type    The patient has a definite abnormal mental status, his exam is consistent with possible sepsis or dehydration, I am concerned for aspiration given his oropharyngeal control, thick secretions and he does appear to be choking on his own secretions in the room.  He is not hypotensive, he is not hypoxic, check a chest x-ray, labs, urinalysis, sepsis workup. He recently had a CT scan of the brain after a fall, no signs of malignant disease on that scan.  Labs totally unremarkable for the most part, vital signs show mild persistent tachycardia, otherwise the patient has maintained a very stable appearance, CT scan negative for stroke or mass or bleed, discussed with the hospitalist who will admit.  D/w the hostpiali  Meds given in ED:  Medications  sodium chloride 0.9 % bolus 1,000 mL (0 mLs Intravenous Stopped 01/28/15 1710)  cefTRIAXone (ROCEPHIN) 1 g in dextrose 5 % 50 mL IVPB (0 g Intravenous Stopped 01/28/15 1914)    New Prescriptions   No medications on file      Noemi Chapel, MD 01/28/15 2336

## 2015-01-28 NOTE — Telephone Encounter (Signed)
per pof to sch pt IV fluids-sent Urgent staff message-got reply today @ 2:34 no openingsin [facility] inf-spoke w/Tami Dr Alvy Bimler nurse she stated Kenneth Jordan was working w/this patient and not to worry about it-they will take care of it

## 2015-01-28 NOTE — Telephone Encounter (Signed)
  Oncology Nurse Navigator Documentation   Navigator Encounter Type: Telephone (01/28/15 1236)         Interventions: Coordination of Care (01/28/15 1236)     Received call from [facility] who provide patient update per his visit of 10:00 - 11:00 AM today.  He reported:  Patient "somewhat confused", experiencing SOB.  VS WNL except for heart rate of 121 which registered 115 after receiving 3 cans of nutritional supplement and water.  He reported assessment to patient dtr Kenneth Jordan who asked him to call me with information. I called Kenneth Jordan, LVMM, indicating importance of her dad coming in this afternoon for scheduled IVF and further evaluation.  I requested return call. Dr. Elizabeth Palau  made aware.  [name], RN, BSN, [place] at [place] [phone]            Time Spent with Patient: 15 (01/28/15 1236)

## 2015-01-28 NOTE — Telephone Encounter (Signed)
  Oncology Nurse Navigator Documentation   Navigator Encounter Type: Telephone (01/28/15 1316)         Interventions: Coordination of Care (01/28/15 1316)     Per Dr. Calton Dach follow-up to patient update, I called patient dtr [name], [facility] relaying Dr. Calton Dach recommendation that she bring her dad to [facility] ED rather than to [facility] for 3:30 Infusion.  I requested she call me to confirm message receipt.  [name], RN, BSN, [place] at [place] [phone]           Time Spent with Patient: 15 (01/28/15 1316)

## 2015-01-28 NOTE — ED Notes (Signed)
MD at bedside. 

## 2015-01-28 NOTE — Progress Notes (Signed)
   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  The patient's family had numerous social stressors and social work was heavily involved. Dr. Isidore Moos had multiple meetings with the family and  filled out paperwork to advocate for the patient and make more help possible via the [place] system.  Plan: The patient has completed radiation treatment. The patient will return to radiation oncology clinic for routine followup in 2-3 weeks.  I advised the family and patient to call or return sooner if any questions or concerns arise that are related to recovery or treatment.  -----------------------------------  Eppie Gibson, MD

## 2015-01-28 NOTE — Progress Notes (Signed)
  Oncology Nurse Navigator Documentation   Navigator Encounter Type: Other (01/28/15 1527)   Treatment Phase: Other (01/28/15 1527)     Met patient and dtr at [facility] ED.    Attending indicated Waunita Schooner will be admitted pending test results.  Provided emotional support to dtr [name] who was very tearful, anxious about his care following this hospitalization.  "I can't take care of him any more and I feel guilty about that; I'm worried about his safety. I'm at the end of my rope."  I called Lorrin Jackson, [facility], who arrived to provide additional support.  LVMM for [name], LCSW, informing her of situation. I will continue to follow during this admission.  [name], RN, BSN, [place] at [place] [phone]                  Time Spent with Patient: 60 (01/28/15 1527)

## 2015-01-28 NOTE — Telephone Encounter (Signed)
  Oncology Nurse Navigator Documentation   Navigator Encounter Type: Telephone (01/28/15 0945)         Interventions: Coordination of Care (01/28/15 0945)     Returned patient dtr [name]'s VMM.  She emotionally stated:  She may not be able to bring her dad in this afternoon for 3:30 IVF.  When she checked him moments earlier he was in bed talking, delusional.   She had to leave for work, he would be unattended.  I LVMM indicating importance of his keeping IVF appt, that his delusional behavior may indicated dehydration.  [name], RN, BSN, [place] at [place] [phone]            Time Spent with Patient: 15 (01/28/15 0945)

## 2015-01-28 NOTE — ED Notes (Signed)
Per daughter, states [facility] RN saw him today-states increased HR and altered-states not making any sense

## 2015-01-28 NOTE — Progress Notes (Signed)
Spiritual Care Note  Referred by [facility] nurse navigator [name], RN for emotional support for pt's dtr Eddie Dibbles, who was tearful, exhausted, and very distressed about acuity of pt's care needs; per dtr, pt has significantly more needs than she can manage at home (diminished hearing, vision, mobility; "hallucinations"/questionable mental status/capacity; hand tremors; feeding tube; need for transportation and accompaniment to all medical appointments; etc).  Per dtr, he has fallen 4x at home.  Provided emotional support.  Assisted [name] in identifying and processing needs (his and hers).  Encouraged her to list needs and concerns to have concise, thorough visual aid when identifying needs to/asking questions of providers.  Dtr sees emotional (stress management) and practical advantages of this strategy.  Consulted with RN and MD re Palliative Care consult as means for comprehensive multidisciplinary assessment and exploring GOC to promote clarity and quality of life/sx mgmt for pt and family.  Thank you to MD for ordering palliative consult.    Plan to f/u with dtr by phone/at bedside tomorrow.  Also available for GOC or other consults for collaborative care; please page as needed.  Thank you.  [place], [place], [place] Pager [phone] Voicemail  [phone]

## 2015-01-28 NOTE — Telephone Encounter (Signed)
Spoke with dtr re IVF's for today @ 3:30 pm - follow up for AM.

## 2015-01-28 NOTE — Telephone Encounter (Signed)
I am in a meeting until 3 pm, and have patients until 330 pm The best I can do is to see him in infusion at 330 pm Alternatively, he sounds quite sick with abnormal vitals He should probably be brought in to the ER

## 2015-01-28 NOTE — ED Notes (Signed)
Placed condom cath on pt and pt can now urinate into a foley bag.

## 2015-01-28 NOTE — Telephone Encounter (Signed)
  Oncology Nurse Navigator Documentation   Navigator Encounter Type: Telephone (01/28/15 1430)         Interventions: Coordination of Care (01/28/15 1430)       VMM from patient dtr [name] indicated she is on her way home from work, will "try" to bring her dad to [facility] ED (as recommended by Dr. Alvy Bimler).  [name], RN, BSN, [place] at [place] [phone]         Time Spent with Patient: 15 (01/28/15 1430)

## 2015-01-28 NOTE — H&P (Addendum)
[facility] History and Physical  Kenneth Jordan [id number] DOB: 09-09-1935 DOA: 01/28/2015  Referring physician: ED physician PCP: Pcp Not In System  Specialists:   Chief Complaint: AMS and generalized weakness  HPI: Kenneth Jordan is a 79 y.o. male with PMH of hypertension, GERD, depression, anxiety, arthritis, cancer of base of tongue (diagnosed in August of this year, currently undergoing chemotherapy as well as radiation treatments, last radiation last week), s/p of G-tube, who presents with altered mental status.  Patient has AMS, is unable to provide accurate medical history, therefore, most of the history is obtained by discussing the case with ED physician, per EMS report, and with the nursing staff. The history is very limited. Per EDP, his daughter reported that the patient has generalized weakness and confusion in the past several days. His daughter feels like pt is hallucinating, saying off-the-wall things. Patient is tachycardic and tachypneic. He is becoming extremely weak and is having frequent falls including in the shower. There is no obvious injury, vomiting, fevers, diarrhea, rashes or coughing. Patient moves all extremities.   In ED, patient was found to have tachycardia, tachypnea, sodium 131, renal function okay, normal lactate,negative troponin, WBC 7.6, temperature normal. CT head is negative for acute abnormalities. Chest x-ray showed hypoventilation. CTA of chest showed no acute pulmonary embolism, but has bilateral lower lobe opacity and volume loss. This may represent pneumonia, aspiration, atelectasis.  Where does patient live?   At home  Can patient participate in ADLs?  None   Review of Systems: Could not be reviewed due to altered mental status.   Allergy:  Allergies  Allergen Reactions  . Dilaudid [Hydromorphone Hcl] Other (See Comments)    Hallucinations  . Other Other (See Comments)    Seasonal allergies - sneezing, runny nose, watery eyes     Past Medical History  Diagnosis Date  . 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 Hypertension   . Retinitis pigmentosa of both eyes   . Anxiety   . Depression   . GERD (gastroesophageal reflux disease)     hx of   . Arthritis     shoulders, right knee   . Insomnia 12/24/2014    Past Surgical History  Procedure Laterality Date  . Biopsy tongue  10/24/14  . Knee surgery Right   . Tonsilectomy, adenoidectomy, bilateral myringotomy and tubes    . Feedign tube srugery     . Multiple extractions with alveoloplasty N/A 11/20/2014    Procedure: Extraction of tooth #'s 1,15,16,17,20,31 with alveoloplasty and gross debridement of remaining teeth;  Surgeon: Lenn Cal, DDS;  Location: [facility];  Service: Oral Surgery;  Laterality: N/A;    Social History:  reports that he has never smoked. He has never used smokeless tobacco. He reports that he does not drink alcohol or use illicit drugs.  Family History:  Family History  Problem Relation Age of Onset  . Ovarian cancer Sister   . Heart attack Father   . Breast cancer      neice on Sister's side.  Also had tongue cancer.     Prior to Admission medications   Medication Sig Start Date End Date Taking? Authorizing Provider  acetaminophen (TYLENOL) 325 MG tablet Take 650 mg by mouth every 6 (six) hours as needed (For pain.).   Yes Historical Provider, MD  clindamycin (CLINDAGEL) 1 % gel Apply 1 application topically as directed. 12/17/14  Yes Historical Provider, MD  hydrocortisone 1 %  ointment Apply 1 application topically 2 (two) times daily. 1 large tube 12/10/14  Yes Ni Gorsuch, MD  lidocaine (XYLOCAINE) 2 % solution Mix 1 part 2%viscous lidocaine,1part H2O.Swish and/or swallow 14mL of this mixture,74min before meals and at bedtime, up to QID 12/09/14  Yes Eppie Gibson, MD  lidocaine-prilocaine (EMLA) cream Apply 1 application topically as needed. 12/02/14  Yes Historical Provider, MD  loratadine (CLARITIN) 10 MG tablet  Take 10 mg by mouth daily as needed for allergies.   Yes Historical Provider, MD  Menthol, Topical Analgesic, 16 % LIQD Apply 1 application topically daily. Applies for shoulder pain.   Yes Historical Provider, MD  metoCLOPramide (REGLAN) 10 MG tablet Take 1 tablet (10 mg total) by mouth 4 (four) times daily -  before meals and at bedtime. 12/18/14  Yes Heath Lark, MD  morphine (ROXANOL) 20 MG/ML concentrated solution Place 0.5 mLs (10 mg total) into feeding tube every 2 (two) hours as needed for severe pain. 12/17/14  Yes Heath Lark, MD  omeprazole (PRILOSEC) 20 MG capsule Take 20 mg by mouth at bedtime as needed (For heartburn or acid reflux.).    Yes Historical Provider, MD  sucralfate (CARAFATE) 1 GM/10ML suspension Take 1 g by mouth 4 (four) times daily as needed (For sore throat.).   Yes Historical Provider, MD  HYDROcodone-acetaminophen (HYCET) 7.5-325 mg/15 ml solution Take 15 mLs by mouth every 6 (six) hours as needed for moderate pain. Patient not taking: Reported on 01/13/2015 11/20/14 11/20/15  Lenn Cal, DDS  sodium fluoride (FLUORISHIELD) 1.1 % GEL dental gel Instill one drop of gel per tooth space of fluoride tray. Place over teeth for 5 minutes. Remove. Spit out excess. Repeat nightly Patient not taking: Reported on 01/13/2015 11/27/14   Lenn Cal, DDS  zolpidem (AMBIEN) 10 MG tablet Take 0.5 tablets (5 mg total) by mouth at bedtime. Patient not taking: Reported on 01/13/2015 12/24/14   Heath Lark, MD    Physical Exam: Filed Vitals:   01/29/15 0024 01/29/15 0030 01/29/15 0118 01/29/15 0220  BP: 145/69 140/76 140/80 156/80  Pulse:   102 103  Temp:    98.2 F (36.8 C)  TempSrc:    Oral  Resp: 26 27 18 22   Weight:    75.116 kg (165 lb 9.6 oz)  SpO2: 95%  96% 100%   General: Not in acute distress HEENT:       Eyes: PERRL, EOMI, no scleral icterus.       ENT: No discharge from the ears and nose, no pharynx injection, no tonsillar enlargement.        Neck: No JVD,  no bruit, no mass felt. Heme: No neck lymph node enlargement. Cardiac: S1/S2, RRR, tachycardia, No murmurs, No gallops or rubs. Pulm: No rales, wheezing, rhonchi or rubs. Has Port A-cath on the R upper chest with clean surroundings Abd: Soft, nondistended, nontender, no organomegaly, BS present. Has G -Tube with clean surroundings. Ext: No pitting leg edema bilaterally. 2+DP/PT pulse bilaterally. Musculoskeletal: No joint deformities, No joint redness or warmth, no limitation of ROM in spin. Skin: No rashes.  Neuro: confused, not oriented X3, cranial nerves II-XII grossly intact, moves all extremities  Psych: Patient is not psychotic, no suicidal or hemocidal ideation.  Labs on Admission:  Basic Metabolic Panel:  Recent Labs Lab 01/28/15 1544  NA 131*  K 4.8  CL 97*  CO2 27  GLUCOSE 104*  BUN 31*  CREATININE 0.90  CALCIUM 9.0   Liver Function Tests: No  results for input(s): AST, ALT, ALKPHOS, BILITOT, PROT, ALBUMIN in the last 168 hours. No results for input(s): LIPASE, AMYLASE in the last 168 hours. No results for input(s): AMMONIA in the last 168 hours. CBC:  Recent Labs Lab 01/28/15 1544  WBC 7.6  HGB 10.4*  HCT 33.9*  MCV 80.0  PLT 384   Cardiac Enzymes: No results for input(s): CKTOTAL, CKMB, CKMBINDEX, TROPONINI in the last 168 hours.  BNP (last 3 results) No results for input(s): BNP in the last 8760 hours.  ProBNP (last 3 results) No results for input(s): PROBNP in the last 8760 hours.  CBG: No results for input(s): GLUCAP in the last 168 hours.  Radiological Exams on Admission: Ct Head Wo Contrast  01/28/2015  CLINICAL DATA:  79 year old male with altered mental status. History of tongue cancer. EXAM: CT HEAD WITHOUT CONTRAST TECHNIQUE: Contiguous axial images were obtained from the base of the skull through the vertex without intravenous contrast. COMPARISON:  Head CT dated 09/30/2014 FINDINGS: The ventricles are dilated and the sulci are prominent  compatible with age-related atrophy. Periventricular and deep white matter hypodensities represent chronic microvascular ischemic changes. There is no intracranial hemorrhage. No mass effect or midline shift identified. The visualized paranasal sinuses are well aerated. There is minimal mucoperiosteal thickening of the mastoid air cells. Multiple calvarial lucencies are stable compared to the prior study and of indeterminate clinical significance. IMPRESSION: No acute intracranial pathology. Age-related atrophy and chronic microvascular ischemic disease. If symptoms persist and there are no contraindications, MRI may provide better evaluation if clinically indicated. Electronically Signed   By: Anner Crete M.D.   On: 01/28/2015 20:50   Ct Angio Chest Pe W/cm &/or Wo Cm  01/29/2015  CLINICAL DATA:  Tachycardia. EXAM: CT ANGIOGRAPHY CHEST WITH CONTRAST TECHNIQUE: Multidetector CT imaging of the chest was performed using the standard protocol during bolus administration of intravenous contrast. Multiplanar CT image reconstructions and MIPs were obtained to evaluate the vascular anatomy. CONTRAST:  100 mL Omnipaque 350 intravenous COMPARISON:  Radiographs 01/28/2015 FINDINGS: Cardiovascular: There is good opacification of the pulmonary arteries. There is no pulmonary embolism. The thoracic aorta is normal in caliber and intact. Lungs: Patchy and linear opacities in both lower lobes, with associated volume loss. This could represent multifocal pneumonia. Atelectasis is also possibility. Aspiration not excluded. Central airways: Patent Effusions: None Lymphadenopathy: None Esophagus: Unremarkable Upper abdomen: Unremarkable Musculoskeletal: No significant abnormality. Moderately severe degenerative disc changes are present, greatest in the mid thoracic spine. Review of the MIP images confirms the above findings. IMPRESSION: 1. Negative for acute pulmonary embolism. 2. Bilateral lower lobe opacity and volume loss.  This may represent pneumonia, aspiration, atelectasis. Electronically Signed   By: Andreas Newport M.D.   On: 01/29/2015 01:44   Dg Chest Port 1 View  01/28/2015  CLINICAL DATA:  Cancer base of tongue.  Short of breath. EXAM: PORTABLE CHEST 1 VIEW COMPARISON:  12/30/2014 FINDINGS: Hypoventilation with decreased lung volume. Increase in bibasilar atelectasis. Both negative for heart failure or pneumonia or effusion Right jugular Port-A-Cath tip in the SVC Moderate to advanced degenerative change in both shoulder joints. IMPRESSION: Hypoventilation with bibasilar atelectasis. Electronically Signed   By: Franchot Gallo M.D.   On: 01/28/2015 16:22    EKG: Independently reviewed.  QTC 481, sinus tachycardia, LAD, L VH  Assessment/Plan Principal Problem:   Acute encephalopathy Active Problems:   Cancer of base of tongue (HCC)   S/P gastrostomy tube (G tube) placement, follow-up exam   Protein calorie  malnutrition ([place])   Sepsis ([place])   UTI (urinary tract infection)   Depression, major, recurrent, mild (HCC)   Tachycardia   Hypertension  Acute encephalopathy: Etiology is not clear. Likely due to sepsis. He has tachycardia and tachypnea, meeting criteria for sepsis. Source of infection is likely due to possible positive urinalysis given small amount of leukocyte on UA and possible PNA as showed on CTA of chest.   -will admit to tele bed - IV Vancomycin and Zosyn - Mucinex prn for cough  - Albuterol Neb prn for SOB - Urine legionella and S. pneumococcal antigen - Follow up blood culture x2, sputum culture  Possible HCAP: -See above  Sepsis: Hemodynamically stable. - will get Procalcitonin and trend lactic acid level per sepsis protocol - IVF: 2.5L of NS bolus in ED, followed by 75 mL per hour of NS   UTI: -On IV Vancomycin and Zosyn -f/u Ux  Cancer of base of tongue ([place]): has been treated by Dr. Alvy Bimler. Last seen was on 01/21/15. Currently undergoing chemotherapy as well as  radiation treatments, last radiation last week. S/P gastrostomy tube (G tube) placement. - f/u with Dr. Alvy Bimler  Protein calorie malnutrition [facility]): -Ensure  GERD: -Protonix and carafate  HTN: not on meds at home. -IV hydralazine when necessary  DVT ppx: SQ Heparin  Code Status: Full code Family Communication: None at bed side.    Disposition Plan: Admit to inpatient   Date of Service 01/29/2015    Ivor Costa [facility] Pager [phone]  If 7PM-7AM, please contact night-coverage [email or web address] Password [email or web address] 01/29/2015, 3:49 AM

## 2015-01-29 ENCOUNTER — Inpatient Hospital Stay (HOSPITAL_COMMUNITY): Payer: Medicare PPO

## 2015-01-29 ENCOUNTER — Encounter: Payer: Self-pay | Admitting: *Deleted

## 2015-01-29 ENCOUNTER — Other Ambulatory Visit: Payer: Self-pay | Admitting: Hematology and Oncology

## 2015-01-29 ENCOUNTER — Encounter (HOSPITAL_COMMUNITY): Payer: Self-pay

## 2015-01-29 ENCOUNTER — Ambulatory Visit: Payer: Self-pay

## 2015-01-29 DIAGNOSIS — I10 Essential (primary) hypertension: Secondary | ICD-10-CM | POA: Diagnosis present

## 2015-01-29 DIAGNOSIS — Z7189 Other specified counseling: Secondary | ICD-10-CM

## 2015-01-29 DIAGNOSIS — R296 Repeated falls: Secondary | ICD-10-CM | POA: Diagnosis present

## 2015-01-29 DIAGNOSIS — H3552 Pigmentary retinal dystrophy: Secondary | ICD-10-CM | POA: Diagnosis present

## 2015-01-29 DIAGNOSIS — Z808 Family history of malignant neoplasm of other organs or systems: Secondary | ICD-10-CM | POA: Diagnosis not present

## 2015-01-29 DIAGNOSIS — D6481 Anemia due to antineoplastic chemotherapy: Secondary | ICD-10-CM | POA: Diagnosis present

## 2015-01-29 DIAGNOSIS — R1310 Dysphagia, unspecified: Secondary | ICD-10-CM | POA: Diagnosis not present

## 2015-01-29 DIAGNOSIS — R4182 Altered mental status, unspecified: Secondary | ICD-10-CM | POA: Diagnosis present

## 2015-01-29 DIAGNOSIS — Z931 Gastrostomy status: Secondary | ICD-10-CM | POA: Diagnosis not present

## 2015-01-29 DIAGNOSIS — E46 Unspecified protein-calorie malnutrition: Secondary | ICD-10-CM | POA: Diagnosis present

## 2015-01-29 DIAGNOSIS — Z8249 Family history of ischemic heart disease and other diseases of the circulatory system: Secondary | ICD-10-CM | POA: Diagnosis not present

## 2015-01-29 DIAGNOSIS — A419 Sepsis, unspecified organism: Secondary | ICD-10-CM | POA: Diagnosis present

## 2015-01-29 DIAGNOSIS — Z515 Encounter for palliative care: Secondary | ICD-10-CM

## 2015-01-29 DIAGNOSIS — D509 Iron deficiency anemia, unspecified: Secondary | ICD-10-CM | POA: Diagnosis not present

## 2015-01-29 DIAGNOSIS — Z923 Personal history of irradiation: Secondary | ICD-10-CM | POA: Diagnosis not present

## 2015-01-29 DIAGNOSIS — R6520 Severe sepsis without septic shock: Secondary | ICD-10-CM | POA: Diagnosis present

## 2015-01-29 DIAGNOSIS — G9340 Encephalopathy, unspecified: Secondary | ICD-10-CM | POA: Diagnosis not present

## 2015-01-29 DIAGNOSIS — F330 Major depressive disorder, recurrent, mild: Secondary | ICD-10-CM | POA: Diagnosis present

## 2015-01-29 DIAGNOSIS — K219 Gastro-esophageal reflux disease without esophagitis: Secondary | ICD-10-CM | POA: Diagnosis present

## 2015-01-29 DIAGNOSIS — G4700 Insomnia, unspecified: Secondary | ICD-10-CM | POA: Diagnosis present

## 2015-01-29 DIAGNOSIS — E871 Hypo-osmolality and hyponatremia: Secondary | ICD-10-CM | POA: Diagnosis present

## 2015-01-29 DIAGNOSIS — M1990 Unspecified osteoarthritis, unspecified site: Secondary | ICD-10-CM | POA: Diagnosis present

## 2015-01-29 DIAGNOSIS — Y95 Nosocomial condition: Secondary | ICD-10-CM | POA: Diagnosis present

## 2015-01-29 DIAGNOSIS — Z66 Do not resuscitate: Secondary | ICD-10-CM | POA: Diagnosis present

## 2015-01-29 DIAGNOSIS — Z9221 Personal history of antineoplastic chemotherapy: Secondary | ICD-10-CM | POA: Diagnosis not present

## 2015-01-29 DIAGNOSIS — J189 Pneumonia, unspecified organism: Secondary | ICD-10-CM | POA: Diagnosis present

## 2015-01-29 DIAGNOSIS — T451X5A Adverse effect of antineoplastic and immunosuppressive drugs, initial encounter: Secondary | ICD-10-CM | POA: Diagnosis present

## 2015-01-29 DIAGNOSIS — C01 Malignant neoplasm of base of tongue: Secondary | ICD-10-CM | POA: Diagnosis present

## 2015-01-29 DIAGNOSIS — G9341 Metabolic encephalopathy: Secondary | ICD-10-CM | POA: Diagnosis present

## 2015-01-29 DIAGNOSIS — N390 Urinary tract infection, site not specified: Secondary | ICD-10-CM | POA: Diagnosis present

## 2015-01-29 LAB — CBC
HEMATOCRIT: 30.5 % — AB (ref 39.0–52.0)
Hemoglobin: 9.2 g/dL — ABNORMAL LOW (ref 13.0–17.0)
MCH: 23.8 pg — ABNORMAL LOW (ref 26.0–34.0)
MCHC: 30.2 g/dL (ref 30.0–36.0)
MCV: 78.8 fL (ref 78.0–100.0)
Platelets: 296 10*3/uL (ref 150–400)
RBC: 3.87 MIL/uL — ABNORMAL LOW (ref 4.22–5.81)
RDW: 15.8 % — AB (ref 11.5–15.5)
WBC: 6.8 10*3/uL (ref 4.0–10.5)

## 2015-01-29 LAB — COMPREHENSIVE METABOLIC PANEL
ALBUMIN: 2.6 g/dL — AB (ref 3.5–5.0)
ALT: 9 U/L — ABNORMAL LOW (ref 17–63)
AST: 13 U/L — AB (ref 15–41)
Alkaline Phosphatase: 47 U/L (ref 38–126)
Anion gap: 7 (ref 5–15)
BUN: 24 mg/dL — AB (ref 6–20)
CHLORIDE: 103 mmol/L (ref 101–111)
CO2: 25 mmol/L (ref 22–32)
Calcium: 8.2 mg/dL — ABNORMAL LOW (ref 8.9–10.3)
Creatinine, Ser: 0.73 mg/dL (ref 0.61–1.24)
GFR calc Af Amer: >60 mL/min (ref >60)
GLUCOSE: 95 mg/dL (ref 65–99)
POTASSIUM: 4.3 mmol/L (ref 3.5–5.1)
SODIUM: 135 mmol/L (ref 135–145)
Total Bilirubin: 0.3 mg/dL (ref 0.3–1.2)
Total Protein: 5.9 g/dL — ABNORMAL LOW (ref 6.5–8.1)

## 2015-01-29 LAB — APTT: APTT: 36 s (ref 24–37)

## 2015-01-29 LAB — GLUCOSE, CAPILLARY: GLUCOSE-CAPILLARY: 78 mg/dL (ref 65–99)

## 2015-01-29 LAB — LACTIC ACID, PLASMA
LACTIC ACID, VENOUS: 1.1 mmol/L (ref 0.5–2.0)
Lactic Acid, Venous: 0.6 mmol/L (ref 0.5–2.0)

## 2015-01-29 LAB — PROCALCITONIN: Procalcitonin: <0.1 ng/mL

## 2015-01-29 LAB — PROTIME-INR
INR: 1.07 (ref 0.00–1.49)
PROTHROMBIN TIME: 14.1 s (ref 11.6–15.2)

## 2015-01-29 LAB — STREP PNEUMONIAE URINARY ANTIGEN: Strep Pneumo Urinary Antigen: NEGATIVE

## 2015-01-29 MED ORDER — HALOPERIDOL LACTATE 5 MG/ML IJ SOLN
5.0000 mg | Freq: Once | INTRAMUSCULAR | Status: AC
  Administered 2015-01-29: 5 mg via INTRAVENOUS
  Filled 2015-01-29: qty 1

## 2015-01-29 MED ORDER — PRO-STAT SUGAR FREE PO LIQD
30.0000 mL | Freq: Two times a day (BID) | ORAL | Status: DC
  Administered 2015-01-29 – 2015-01-30 (×3): 30 mL
  Filled 2015-01-29 (×6): qty 30

## 2015-01-29 MED ORDER — VALPROATE SODIUM 250 MG/5ML PO SYRP
250.0000 mg | ORAL_SOLUTION | Freq: Two times a day (BID) | ORAL | Status: DC
  Administered 2015-01-29 – 2015-02-03 (×10): 250 mg via ORAL
  Filled 2015-01-29 (×12): qty 5

## 2015-01-29 MED ORDER — FREE WATER
100.0000 mL | Freq: Every day | Status: DC
  Administered 2015-01-29 – 2015-01-31 (×8): 100 mL

## 2015-01-29 MED ORDER — IOHEXOL 350 MG/ML SOLN
100.0000 mL | Freq: Once | INTRAVENOUS | Status: AC | PRN
  Administered 2015-01-29: 100 mL via INTRAVENOUS

## 2015-01-29 MED ORDER — HEPARIN SODIUM (PORCINE) 5000 UNIT/ML IJ SOLN
5000.0000 [IU] | Freq: Three times a day (TID) | INTRAMUSCULAR | Status: DC
  Administered 2015-01-29: 5000 [IU] via SUBCUTANEOUS
  Filled 2015-01-29 (×5): qty 1

## 2015-01-29 MED ORDER — ALBUTEROL SULFATE (2.5 MG/3ML) 0.083% IN NEBU: 2.5000 mg | INHALATION_SOLUTION | RESPIRATORY_TRACT | Status: DC | PRN

## 2015-01-29 MED ORDER — DM-GUAIFENESIN ER 30-600 MG PO TB12
1.0000 | ORAL_TABLET | Freq: Two times a day (BID) | ORAL | Status: DC | PRN
  Filled 2015-01-29: qty 1

## 2015-01-29 MED ORDER — PANTOPRAZOLE SODIUM 40 MG PO PACK
40.0000 mg | PACK | Freq: Every day | ORAL | Status: DC
  Administered 2015-01-30: 40 mg
  Filled 2015-01-29 (×3): qty 20

## 2015-01-29 MED ORDER — JEVITY 1.2 CAL PO LIQD: 1000.0000 mL | ORAL | Status: DC

## 2015-01-29 MED ORDER — OSMOLITE 1.5 CAL PO LIQD
237.0000 mL | Freq: Every day | ORAL | Status: DC
Start: 2015-01-29 — End: 2015-01-31
  Administered 2015-01-29 – 2015-01-31 (×8): 237 mL
  Filled 2015-01-29 (×14): qty 237

## 2015-01-29 MED ORDER — ONDANSETRON HCL 4 MG PO TABS: 4.0000 mg | ORAL_TABLET | Freq: Four times a day (QID) | ORAL | Status: DC | PRN

## 2015-01-29 MED ORDER — PIPERACILLIN-TAZOBACTAM 3.375 G IVPB
3.3750 g | Freq: Three times a day (TID) | INTRAVENOUS | Status: DC
  Administered 2015-01-29 – 2015-01-31 (×6): 3.375 g via INTRAVENOUS
  Filled 2015-01-29 (×7): qty 50

## 2015-01-29 MED ORDER — METOCLOPRAMIDE HCL 5 MG/5ML PO SOLN
10.0000 mg | Freq: Three times a day (TID) | ORAL | Status: DC
  Administered 2015-01-29 – 2015-01-31 (×4): 10 mg
  Filled 2015-01-29 (×9): qty 10

## 2015-01-29 MED ORDER — ONDANSETRON HCL 4 MG/2ML IJ SOLN: 4.0000 mg | Freq: Four times a day (QID) | INTRAMUSCULAR | Status: DC | PRN

## 2015-01-29 MED ORDER — PIPERACILLIN-TAZOBACTAM 3.375 G IVPB 30 MIN
3.3750 g | Freq: Once | INTRAVENOUS | Status: AC
  Administered 2015-01-29: 3.375 g via INTRAVENOUS
  Filled 2015-01-29: qty 50

## 2015-01-29 MED ORDER — VANCOMYCIN HCL IN DEXTROSE 1-5 GM/200ML-% IV SOLN
1000.0000 mg | Freq: Two times a day (BID) | INTRAVENOUS | Status: DC
  Administered 2015-01-29 – 2015-01-31 (×5): 1000 mg via INTRAVENOUS
  Filled 2015-01-29 (×5): qty 200

## 2015-01-29 MED ORDER — SODIUM CHLORIDE 0.9 % IJ SOLN
3.0000 mL | Freq: Two times a day (BID) | INTRAMUSCULAR | Status: DC
  Administered 2015-01-29 – 2015-02-03 (×8): 3 mL via INTRAVENOUS

## 2015-01-29 MED ORDER — ENOXAPARIN SODIUM 40 MG/0.4ML ~~LOC~~ SOLN
40.0000 mg | SUBCUTANEOUS | Status: DC
  Administered 2015-01-29 – 2015-01-30 (×2): 40 mg via SUBCUTANEOUS
  Filled 2015-01-29 (×3): qty 0.4

## 2015-01-29 MED ORDER — HYDRALAZINE HCL 20 MG/ML IJ SOLN: 5.0000 mg | INTRAMUSCULAR | Status: DC | PRN

## 2015-01-29 NOTE — Care Management Note (Signed)
Case Management Note  Patient Details  Name: JUNG BUESO MRN: [id number] Date of Birth: [birth year]  Subjective/Objective:         ams with poss pna versus malignant disease process           Action/Plan:Date: January 29, 2015 Chart reviewed for concurrent status and case management needs. Will continue to follow patient for changes and needs: Velva Harman, RN, BSN, [place]   [phone]   Expected Discharge Date:   (unknown)               Expected Discharge Plan:  Home w Hospice Care  In-House Referral:  Clinical Social Work  Discharge planning Services  CM Consult  Post Acute Care Choice:  NA Choice offered to:  NA  DME Arranged:    DME Agency:     HH Arranged:    [place] Agency:     Status of Service:  In process, will continue to follow  Medicare Important Message Given:    Date Medicare IM Given:    Medicare IM give by:    Date Additional Medicare IM Given:    Additional Medicare Important Message give by:     If discussed at [place] of Stay Meetings, dates discussed:    Additional Comments:  Leeroy Cha, RN 01/29/2015, 1:11 PM

## 2015-01-29 NOTE — Consult Note (Signed)
[place]  Telephone:([phone]) [phone]   Requesting Provider: [facility]  Consulting Provider: Heath Lark, MD  Primary Oncologist: Heath Lark, MD  Patient Care Team: Pcp Not In System as PCP - General Leota Sauers, RN as Oncology Nurse Navigator Heath Lark, MD as Consulting Physician (Hematology and Oncology) Eppie Gibson, MD as Attending Physician (Radiation Oncology) Karie Mainland, RD as Dietitian (Nutrition)  HOSPITAL CONSULTATION  NOTE I have seen the patient, examined him and edited the notes as follows  HPI: 79 year old man with a history of squamous cell carcinoma of the base of the tongue treated with concurrent cetuximab and radiation therapy finished on 11/23, tolerating the treatment poorly, admitted by the Hospitalist team due to increased confusion. History is obtained by chart as no family members are present and patient cannot interact due to his confusion. He had increased weakness, hallucinations, and had generalized weakness. He has been experiencing falls without injuries. Per chart, no nausea, vomiting, fever, diarrhea, coughing .He has resolving skin rash around his neck. No apparent bleeding issues such as epistaxis, hematemesis, hematuria or hematochezia were present.   On admission, he was tachycardic. He was found to be hyponatremic as well with sodium of 131, normal creatinine,normal cardiac function, CBC was unremarkable. He was afebrile. His CT head was negative for acute abnormalities. CT angio of the chest showed bilateral lower lobe opacities suspicious for pneumonia or aspirations.Cultures drawn and he was started on IV antibiotics. He then was treated for sepsis and pneumonia  We were kindly notified of the patient's admission.    Oncology History   Cancer of base of tongue   Staging form: Lip and Oral Cavity, AJCC 7th Edition     Clinical stage from 11/08/2014: Stage IVA (T4a, N2c, M0) - Signed by Heath Lark, MD on  11/08/2014       Cancer of base of tongue ([place])   07/08/2014 Miscellaneous  the patient noted tongue swelling / mass   10/04/2014 Imaging  CT scan done at the [facility] show large tongue mass with bilateral neck lymphadenopathy   10/07/2014 Miscellaneous  he was seen at the [facility] for evaluation was noted to have tongue cancer   10/10/2014 Imaging  PET CT scan from the [facility] showed extensive regional lymphadenopathy on the left side including supraclavicular lymph node involvement and possibly right jugulodigastric lymph node involvement.   10/24/2014 Procedure  he underwent laryngoscopy and biopsy. there is a large ulcerative tongue a mass on the left involving the tonsil, soft palate, epiglottis.   10/24/2014 Pathology Results  pathology report from [facility] show squamous cell carcinoma with basaloid feature, P 16 positive by PCR   11/20/2014 Surgery he had multiple dental extractions   12/03/2014 Procedure he has port placement   12/04/2014 -  Chemotherapy He received weekly Erbitux   12/04/2014 -  Radiation Therapy he received radiation treatment   12/04/2014 Adverse Reaction he had infusion reaction from Erbitux   12/17/2014 Adverse Reaction He is noted to have worsening skin rash and oral mucositis related to side effects of Erbitux. The dose of Erbitux is reduced by 50%   12/30/14 Hospital He was admitted for Febrile Sepsis   01/28/15 Hospital He was admitted for sepsis and pneumonia, with mental status changes    Past Medical History  Diagnosis Date  . 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 Hypertension   . Retinitis pigmentosa of both eyes   .  Anxiety   . Depression   . GERD (gastroesophageal reflux disease)     hx of   . Arthritis     shoulders, right knee   . Insomnia 12/24/2014     MEDICATIONS:  Scheduled Meds: . clindamycin  1 application Topical UD  . feeding supplement (ENSURE ENLIVE)  237 mL Oral BID BM  . heparin  5,000 Units Subcutaneous 3 times per day  . hydrocortisone  1  application Topical BID  . metoCLOPramide  10 mg Oral TID AC & HS  . MUSCLE RUB  1 application Topical Daily  . pantoprazole  40 mg Oral Daily  . piperacillin-tazobactam (ZOSYN)  IV  3.375 g Intravenous 3 times per day  . sodium chloride  3 mL Intravenous Q12H  . vancomycin  1,000 mg Intravenous BID   Continuous Infusions: . sodium chloride 75 mL/hr at 01/29/15 0243   PRN Meds:.acetaminophen, albuterol, dextromethorphan-guaiFENesin, hydrALAZINE, lidocaine-prilocaine, loratadine, morphine, ondansetron **OR** ondansetron (ZOFRAN) IV, sucralfate  ALLERGIES:  Allergies  Allergen Reactions  . Dilaudid [Hydromorphone Hcl] Other (See Comments)    Hallucinations  . Other Other (See Comments)    Seasonal allergies - sneezing, runny nose, watery eyes    Review of systems:  Unable to obtain, as the patient is not interactive at this time, due to confusion.   Family History  Problem Relation Age of Onset  . Ovarian cancer Sister   . Heart attack Father   . Breast cancer      neice on Sister's side.  Also had tongue cancer.     Past Surgical History  Procedure Laterality Date  . Biopsy tongue  10/24/14  . Knee surgery Right   . Tonsilectomy, adenoidectomy, bilateral myringotomy and tubes    . Feedign tube srugery     . Multiple extractions with alveoloplasty N/A 11/20/2014    Procedure: Extraction of tooth #'s 1,15,16,17,20,31 with alveoloplasty and gross debridement of remaining teeth;  Surgeon: Lenn Cal, DDS;  Location: [facility];  Service: Oral Surgery;  Laterality: N/A;    Social History   Social History  . Marital Status: Widowed    Spouse Name: N/A  . Number of Children: 2  . Years of Education: N/A   Occupational History  . Acupuncturist    Social History Main Topics  . Smoking status: Never Smoker   . Smokeless tobacco: Never Used  . Alcohol Use: No     Comment: hx of occ drink   . Drug Use: No  . Sexual Activity: No   Other Topics Concern  . Not on  file   Social History Narrative     PHYSICAL EXAMINATION:  Filed Vitals:   01/29/15 0220 01/29/15 0502  BP: 156/80 162/97  Pulse: 103 110  Temp: 98.2 F (36.8 C) 98.4 F (36.9 C)  Resp: 22 19     No intake or output data in the 24 hours ending 01/29/15 0738   GENERAL:Patient is alert, no apparent distress and comfortable SKIN: skin color, texture, turgor are normal, resolving skin lesion consistent with cetuximab-induced skin reaction. No ulceration noted. EYES: normal, conjunctiva are pink and non-injected, sclera clear OROPHARYNX:  no erythema NECK: supple, thyroid normal size, non-tender, without nodularity LYMPH: no palpable lymphadenopathy noted LUNGS: Decreased breath sounds at the bases with normal breathing effort HEART: regular rate & rhythm and no murmurs and no lower extremity edema. Right port is normal ABDOMEN: soft, non-tender and normal bowel sounds. Feeding tube looks normal. Musculoskeletal:no cyanosis of digits  and no clubbing  PSYCH: The patient opens his eyes,not oriented. NEURO: He cannot follow commands at this time  LABORATORY/RADIOLOGY DATA:   Recent Labs Lab 01/28/15 1544 01/29/15 0430  WBC 7.6 6.8  HGB 10.4* 9.2*  HCT 33.9* 30.5*  PLT 384 296  MCV 80.0 78.8  MCH 24.5* 23.8*  MCHC 30.7 30.2  RDW 16.0* 15.8*    CMP    Recent Labs Lab 01/28/15 1544 01/29/15 0430  NA 131* 135  K 4.8 4.3  CL 97* 103  CO2 27 25  GLUCOSE 104* 95  BUN 31* 24*  CREATININE 0.90 0.73  CALCIUM 9.0 8.2*  AST  --  13*  ALT  --  9*  ALKPHOS  --  47  BILITOT  --  0.3        Component Value Date/Time   BILITOT 0.3 01/29/2015 0430   BILITOT <0.30 01/13/2015 1557     Recent Labs Lab 01/29/15 0019  INR 1.07      Urinalysis    Component Value Date/Time   COLORURINE YELLOW 01/28/2015 2220   APPEARANCEUR CLOUDY* 01/28/2015 2220   LABSPEC 1.019 01/28/2015 2220   PHURINE 7.0 01/28/2015 2220   GLUCOSEU NEGATIVE 01/28/2015 2220   HGBUR TRACE*  01/28/2015 2220   BILIRUBINUR NEGATIVE 01/28/2015 2220   KETONESUR NEGATIVE 01/28/2015 2220   PROTEINUR 30* 01/28/2015 2220   UROBILINOGEN 1.0 12/30/2014 1437   NITRITE NEGATIVE 01/28/2015 2220   LEUKOCYTESUR SMALL* 01/28/2015 2220    Liver Function Tests:  Recent Labs Lab 01/29/15 0430  AST 13*  ALT 9*  ALKPHOS 47  BILITOT 0.3  PROT 5.9*  ALBUMIN 2.6*    Radiology Studies: I reviewed CT scan  Dg Chest 2 View  12/30/2014  CLINICAL DATA:  Mental status changes in weakness over the last 2 days. Fell yesterday. EXAM: CHEST  2 VIEW COMPARISON:  None. FINDINGS: Heart size is normal. There is atherosclerosis of the aorta. Power port in place from a right internal jugular approach has its tip in the SVC above the right atrium. The vascularity is normal. The lungs are clear. No effusions. No acute bone findings. IMPRESSION: No active cardiopulmonary disease. Electronically Signed   By: Nelson Chimes M.D.   On: 12/30/2014 13:34   Ct Head Wo Contrast  12/30/2014  . COMPARISON:  09/30/2014 FINDINGS: Generalized atrophy. Normal ventricular morphology. No midline shift or mass effect. Small vessel chronic ischemic changes of deep cerebral white matter. No intracranial hemorrhage, mass lesion, or acute infarction. Visualized paranasal sinuses and mastoid air cells clear. Bones unremarkable. Atherosclerotic calcification of internal carotid and vertebral arteries at skullbase. IMPRESSION: Atrophy with small vessel chronic ischemic changes of deep cerebral white matter. No acute intracranial abnormalities. Electronically Signed   By: Lavonia Dana M.D.   On: 12/30/2014 13:26   Ct Soft Tissue Neck W Contrast  12/30/2014  COMPARISON:  Outside [facility] neck CT 10/04/2014 available on [facility] PACS FINDINGS: Pharynx and larynx: Regressed but not resolved mass like hyper enhancement at the left tongue base and glossal tonsillar sulcus. Residual masslike hyper enhancement is poorly marginated. There  is generalized pharyngeal mucosal space edema including involvement of the epiglottis. Negative parapharyngeal spaces. Retropharyngeal course of both carotid arteries. Trace retropharyngeal effusion. Salivary glands: Abnormal left sublingual space which appears primarily due to extensive oral tongue and sublingual extension of the primary tumor. This is better demonstrated on 10/04/2014. Stable submandibular gland ; mild postradiation changes. Negative parotid glands. Thyroid: Negative. Lymph nodes: Malignant left  level 2/level 3 lymphadenopathy is stable to perhaps slightly increased in size since August, with partially cystic/ necrotic nodes measuring 20-23 mm diameter individually today versus 20 mm previously. Right side cervical lymph nodes appear stable, the largest at right level IIa a measuring 8 mm short axis. Vascular: Increased effacement of the left internal jugular vein since August related to the malignant nodes, but this does remain patent. Retropharyngeal course of both common and internal carotid arteries. Major vascular structures in the neck and at the skullbase remain patent. Carotid calcified atherosclerosis. Right IJ approach porta cath partially visible. Limited intracranial: Negative. Visualized orbits: Not included. Mastoids and visualized paranasal sinuses: Minimal left maxillary alveolar recess mucosal thickening, otherwise Visualized paranasal sinuses and mastoids are clear. Skeleton: Some interval tooth extractions. Degenerative changes in the cervical spine. Degenerative cervical spinal stenosis. No suspicious osseous lesion identified in the neck. Upper chest: No superior mediastinal lymphadenopathy. Negative lung apices. IMPRESSION: 1. Some regression of the large left tongue tumor with involvement of the sublingual space. Tumor margins now indistinct. 2. Stable to slightly increased malignant left level 2 and level 3 nodes since August. Increased effacement of the left internal  jugular vein which remains patent. 3. Otherwise expected postradiation changes in the neck soft tissues. 4. Advanced cervical spine degeneration with degenerative spinal stenosis. Electronically Signed   By: Genevie Ann M.D.   On: 12/30/2014 15:47   ASSESSMENT AND PLAN:  Cancer of base of tongue ([place]) Squamous cell carcinoma of the base of the tongue s/p concurrent cetuximab and radiation therapy, tolerating it poorly He is very weak and not interactive. Mayy need placement in skilled nursing facility.  Sepsis, likely pneumonia Cultures were drawn, currently pending. On IV antibiotics with vancomycin and Zosyn Continue current care  Acute encephalopathy, metabolic in nature Etiology is unknown at this time, in the setting of sepsis, with possible pneumonia versus UTI, as well as hyponatremia CT of the head is negative for acute intracranial abnormalities Cultures are currently pending. We will continue to monitor  Possible healthcare associated pneumonia Possible UTI CT angio is suggestive of bilateral opacities, consistent with possible pneumonia He is on IV vancomycin and Zosyn Cultures are pending, will wait for results  Protein calorie malnutrition ([place]) He has progressive worsening calorie malnutrition. Continue tube feeds He will continue close monitoring by dietitian in hospital. Recommend IV fluid support   Anemia, microcytic Due to recent chemotherapy, malnutrition. Dilution, infection, antibiotics  No transfusion is indicated at this time Monitor counts closely Transfuse blood to maintain a Hb of 8 g or if the patient is acutely bleeding  DVT prophylaxis On Subcutaneous heparin  Code status I would recommend DNR  Discharge planning The patient is currently deemed medically incompetent. He is not ready for discharged due to ongoing confusion and the need for IV antibiotics. I spoke with his daughter [name] and give her an update. The patient would need to be  discharged to a skilled nursing facility eventually. Home care with outpatient therapy is no longer an option. Other medical issues as per admitting team    [facility] E, PA-C 01/29/2015, 7:38 AM   Gomer France, MD 01/29/2015

## 2015-01-29 NOTE — Progress Notes (Signed)
  Oncology Nurse Navigator Documentation   Navigator Encounter Type: Other (01/29/15 [id number]) Patient Visit Type: Inpatient (01/29/15 [id number])     Visited patient in [place] 1510 to check on his wellbeing. He was conversant but somnolent.  He expressed understanding he was being treated for pneumonia. I provided bedside RN my contact information should my assistance be helpful. Will continue to monitor during this admission.  [name], RN, BSN, [place] [facility] [place] [phone]                   Time Spent with Patient: 30 (01/29/15 0925)

## 2015-01-29 NOTE — Consult Note (Signed)
Consultation Note Date: 01/29/2015   Patient Name: Kenneth Jordan  DOB: 1935-10-21  MRN: [id number]  Age / Sex: 79 y.o., male  PCP: Pcp Not In System Referring Physician: Venetia Maxon Rama, MD  Reason for Consultation: Establishing goals of care, Non pain symptom management, Pain control and Psychosocial/spiritual support    Clinical Assessment/Narrative:   SUMMARY OF ONCOLOGIC HISTORY: Oncology History   Cancer of base of tongue  Staging form: Lip and Oral Cavity, AJCC 7th Edition  Clinical stage from 11/08/2014: Stage IVA (T4a, N2c, M0) - Signed by Heath Lark, MD on 11/08/2014       Cancer of base of tongue ([place])   07/08/2014 Miscellaneous the patient noted tongue swelling / mass   10/04/2014 Imaging CT scan done at the [facility] show large tongue mass with bilateral neck lymphadenopathy   10/07/2014 Miscellaneous he was seen at the [facility] for evaluation was noted to have tongue cancer   10/10/2014 Imaging PET CT scan from the [facility] showed extensive regional lymphadenopathy on the left side including supraclavicular lymph node involvement and possibly right jugulodigastric lymph node involvement.   10/24/2014 Procedure he underwent laryngoscopy and biopsy. there is a large ulcerative tongue a mass on the left involving the tonsil, soft palate, epiglottis.   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 Treatment was stopped due to decline in performance status and various side-effects   12/04/2014 -  Radiation Therapy he received radiation treatment   12/04/2014 Adverse Reaction he had infusion reaction from Erbitux    12/17/2014 Adverse Reaction He is noted to have worsening skin rash and oral mucositis related to side effects of Erbitux. The dose of Erbitux is reduced by 50%   12/30/2014 - 01/03/2015 Hospital Admission He was admitted to the hospital for altered mental status and sepsis. He received antibiotics for UTI and fluconazole for thrush   12/30/2014 Imaging CT Neck w Contrast - 1. Some regression of the large left tongue tumor with involvement of the sublingual space. Tumor margins now indistinct. 2. Stable to slightly increased malignant left level 2 and level 3 nodes since August.           Continued physical, functional and cognitive decline over the past several week.  He has a feeding tube for nutritional support  Family is unable to care for him at home.  Limited care options.  Family is faced with advanced directive decisions and anticipatroy care needs.  Daughter Lambert Keto is "exhausted and overwhelmed"   This NP Wadie Lessen reviewed medical records, received report from team, assessed the patient and then meet at the patient's bedside along with his daughter Lambert Keto  to discuss diagnosis prognosis, [place], EOL wishes disposition and options.   A detailed discussion was had today regarding advanced directives.  Concepts specific to code status, artifical feeding and hydration, continued IV antibiotics and rehospitalization was had.  The difference between a aggressive medical intervention path  and a palliative comfort care path for this patient at this time was had.  Values and goals of care important to patient and family were attempted to be elicited.  Concept of Hospice and Palliative Care were discussed  Kellie verbalizes feelings and frustrations regarding her father's situation.  She doesn't understand why he "started this whole process".  She doesn't like being put in a position of decision maker and at this point in time her father does not have capacity.  Her sister lives of of  town.  She is very worried that they will run into the same "problem as before" when trying to find a facility to meet her father's needs.  Natural trajectory and expectations at EOL were discussed.  Questions and concerns addressed.  Hard Choices booklet left for review. Family encouraged to call with questions or concerns.  PMT will continue to support holistically.   Primary Decision Maker: Kellie/daughter    HCPOA: no    SUMMARY OF RECOMMENDATIONS  - treat the treatable -access alternative care other than home with daughter -daughter to continue conversation with other family members and decisions will be made dependant on patient outcomes over the next weks to months    Code Status/Advance Care Planning:   DNR       Code Status Orders        Start     Ordered   01/29/15 0001  Full code   Continuous     01/29/15 0001      Other Directives:None  Symptom Management:   Agitation/Hallucination: Valproic acid soln  250 mg twice a day via PEG Weakness: PT evaluation and tx  Palliative Prophylaxis:    Aspiration, Bowel Regimen, Delirium Protocol, Frequent Pain Assessment and Oral Care    Psycho-social/Spiritual:   Support System: [place]   Desire for further Chaplaincy support:no  Additional Recommendations: Education on Hospice and Referral to [employer]   Prognosis: Unable to determine  Discharge Planning: Pending  Chief Complaint/ Primary Diagnoses: Present on Admission:  . UTI (urinary tract infection) . Protein calorie malnutrition ([place]) . Depression, major, recurrent, mild ([place]) . Cancer of base of tongue ([place]) . Acute encephalopathy . Tachycardia . Sepsis ([place]) . Hypertension . HCAP (healthcare-associated pneumonia)  I have reviewed the medical record, interviewed the patient and family, and examined the patient. The following aspects are pertinent.  Past Medical History  Diagnosis Date  . Cancer of base of tongue (HCC)     Left  BOT  . Hypertension   . Retinitis pigmentosa of both eyes   . Anxiety   . Depression   . GERD (gastroesophageal reflux disease)     hx of   . Arthritis     shoulders, right knee   . Insomnia 12/24/2014   Social History   Social History  . Marital Status: Widowed    Spouse Name: N/A  . Number of Children: 2  . Years of Education: N/A   Occupational History  . Acupuncturist    Social History Main Topics  . Smoking status: Never Smoker   . Smokeless tobacco: Never Used  . Alcohol Use: No     Comment: hx of occ drink   . Drug Use: No  . Sexual Activity: No   Other Topics Concern  . None   Social History Narrative   Family History  Problem Relation Age of Onset  . Ovarian cancer Sister   . Heart attack Father   . Breast cancer      neice on Sister's side.  Also had tongue cancer.   Scheduled Meds: . clindamycin  1 application Topical BID  . enoxaparin (LOVENOX) injection  40 mg Subcutaneous Q24H  . feeding supplement (ENSURE ENLIVE)  237 mL Oral BID BM  . hydrocortisone  1 application Topical BID  . metoCLOPramide  10 mg Oral TID  AC & HS  . MUSCLE RUB  1 application Topical Daily  . pantoprazole  40 mg Oral Daily  . piperacillin-tazobactam (ZOSYN)  IV  3.375 g Intravenous 3 times per day  . sodium chloride  3 mL Intravenous Q12H  . vancomycin  1,000 mg Intravenous BID   Continuous Infusions: . sodium chloride 75 mL/hr at 01/29/15 0243   PRN Meds:.acetaminophen, albuterol, dextromethorphan-guaiFENesin, hydrALAZINE, lidocaine-prilocaine, loratadine, morphine, ondansetron **OR** ondansetron (ZOFRAN) IV, sucralfate Medications Prior to Admission:  Prior to Admission medications   Medication Sig Start Date End Date Taking? Authorizing Provider  acetaminophen (TYLENOL) 325 MG tablet Take 650 mg by mouth every 6 (six) hours as needed (For pain.).   Yes Historical Provider, MD  clindamycin (CLINDAGEL) 1 % gel Apply 1 application topically 2 (two) times daily.   12/17/14  Yes Historical Provider, MD  hydrocortisone 1 % ointment Apply 1 application topically 2 (two) times daily. 1 large tube 12/10/14  Yes Ni Gorsuch, MD  lidocaine (XYLOCAINE) 2 % solution Mix 1 part 2%viscous lidocaine,1part H2O.Swish and/or swallow 27mL of this mixture,39min before meals and at bedtime, up to QID 12/09/14  Yes Eppie Gibson, MD  lidocaine-prilocaine (EMLA) cream Apply 1 application topically as needed. 12/02/14  Yes Historical Provider, MD  loratadine (CLARITIN) 10 MG tablet Take 10 mg by mouth daily as needed for allergies.   Yes Historical Provider, MD  Menthol, Topical Analgesic, 16 % LIQD Apply 1 application topically daily. Applies for shoulder pain.   Yes Historical Provider, MD  metoCLOPramide (REGLAN) 10 MG tablet Take 1 tablet (10 mg total) by mouth 4 (four) times daily -  before meals and at bedtime. 12/18/14  Yes Heath Lark, MD  morphine (ROXANOL) 20 MG/ML concentrated solution Place 0.5 mLs (10 mg total) into feeding tube every 2 (two) hours as needed for severe pain. 12/17/14  Yes Heath Lark, MD  omeprazole (PRILOSEC) 20 MG capsule Take 20 mg by mouth at bedtime as needed (For heartburn or acid reflux.).    Yes Historical Provider, MD  sucralfate (CARAFATE) 1 GM/10ML suspension Take 1 g by mouth 4 (four) times daily as needed (For sore throat.).   Yes Historical Provider, MD  HYDROcodone-acetaminophen (HYCET) 7.5-325 mg/15 ml solution Take 15 mLs by mouth every 6 (six) hours as needed for moderate pain. Patient not taking: Reported on 01/13/2015 11/20/14 11/20/15  Lenn Cal, DDS  sodium fluoride (FLUORISHIELD) 1.1 % GEL dental gel Instill one drop of gel per tooth space of fluoride tray. Place over teeth for 5 minutes. Remove. Spit out excess. Repeat nightly Patient not taking: Reported on 01/13/2015 11/27/14   Lenn Cal, DDS  zolpidem (AMBIEN) 10 MG tablet Take 0.5 tablets (5 mg total) by mouth at bedtime. Patient not taking: Reported on 01/13/2015  12/24/14   Heath Lark, MD   Allergies  Allergen Reactions  . Dilaudid [Hydromorphone Hcl] Other (See Comments)    Hallucinations  . Other Other (See Comments)    Seasonal allergies - sneezing, runny nose, watery eyes    Review of Systems  Unable to perform ROS   Physical Exam  Constitutional: He appears well-developed.  HENT:  Noted surgical and radiation changes entire mouth area is tender to gentle touch  Cardiovascular: Normal rate and regular rhythm.   Neurological:  Confused, oriented to person only    Vital Signs: BP 162/97 mmHg  Pulse 110  Temp(Src) 98.4 F (36.9 C) (Oral)  Resp 19  Wt 75.116 kg (165 lb 9.6 oz)  SpO2 99%  SpO2: SpO2: 99 % O2 Device:SpO2: 99 % O2 Flow Rate: .   IO: Intake/output summary: No intake or output data in the 24 hours ending 01/29/15 0951  LBM: Last BM Date:  (unknown) Baseline Weight: Weight: 77.111 kg (170 lb) Most recent weight: Weight: 75.116 kg (165 lb 9.6 oz)      Palliative Assessment/Data:    Additional Data Reviewed:  CBC:    Component Value Date/Time   WBC 6.8 01/29/2015 0430   WBC 9.2 01/13/2015 1558   HGB 9.2* 01/29/2015 0430   HGB 10.7* 01/13/2015 1558   HCT 30.5* 01/29/2015 0430   HCT 34.2* 01/13/2015 1558   PLT 296 01/29/2015 0430   PLT 495* 01/13/2015 1558   MCV 78.8 01/29/2015 0430   MCV 77.0* 01/13/2015 1558   NEUTROABS 6.0 01/13/2015 1558   NEUTROABS 15.1* 12/30/2014 1237   LYMPHSABS 1.6 01/13/2015 1558   LYMPHSABS 0.7 12/30/2014 1237   MONOABS 1.2* 01/13/2015 1558   MONOABS 0.9 12/30/2014 1237   EOSABS 0.4 01/13/2015 1558   EOSABS 0.0 12/30/2014 1237   BASOSABS 0.1 01/13/2015 1558   BASOSABS 0.0 12/30/2014 1237   Comprehensive Metabolic Panel:    Component Value Date/Time   NA 135 01/29/2015 0430   NA 139 01/13/2015 1557   K 4.3 01/29/2015 0430   K 4.9 01/13/2015 1557   CL 103 01/29/2015 0430   CO2 25 01/29/2015 0430   CO2 24 01/13/2015 1557   BUN 24* 01/29/2015 0430   BUN 30.0*  01/13/2015 1557   CREATININE 0.73 01/29/2015 0430   CREATININE 0.8 01/13/2015 1557   GLUCOSE 95 01/29/2015 0430   GLUCOSE 75 01/13/2015 1557   CALCIUM 8.2* 01/29/2015 0430   CALCIUM 9.5 01/13/2015 1557   AST 13* 01/29/2015 0430   AST 23 01/13/2015 1557   ALT 9* 01/29/2015 0430   ALT 16 01/13/2015 1557   ALKPHOS 47 01/29/2015 0430   ALKPHOS 71 01/13/2015 1557   BILITOT 0.3 01/29/2015 0430   BILITOT <0.30 01/13/2015 1557   PROT 5.9* 01/29/2015 0430   PROT 6.6 01/13/2015 1557   ALBUMIN 2.6* 01/29/2015 0430   ALBUMIN 2.6* 01/13/2015 1557   Discussed with Dr Rama  Time In: 0945 Time Out: 1115 Time Total: 90 min Greater than 50%  of this time was spent counseling and coordinating care related to the above assessment and plan.  Signed by: Wadie Lessen, NP  Knox Royalty, NP  01/29/2015, 9:51 AM  Please contact Palliative Medicine Team phone at [phone] for questions and concerns.

## 2015-01-29 NOTE — Progress Notes (Signed)
Patient has a Mickey tube that does not have an access port.  Notified charge nurse who spoke with nutrition who is attempting to find extension for the tube.

## 2015-01-29 NOTE — Progress Notes (Signed)
Spiritual Care Note  Attempted f/u at bedside, but pt sleeping.  Consulted with NT present due to pt's earlier agitation; missed dtr's visit this am.    Chaplain support is available for pt/family 24/7 while inpatient:  [facility] chaplain, [phone] (8:30a-5:30p) [facility] on-call chaplain, [phone] (night/weekend)  Please page as needs arise (pt need, family support, GOC, status change, etc).  Thank you.  [place], [place], [facility] Pager [phone] Voicemail [phone]

## 2015-01-29 NOTE — Progress Notes (Signed)
Initial Nutrition Assessment  DOCUMENTATION CODES:   Not applicable  INTERVENTION:  - Will order 1 can Osmolite 1.5 six times/day with 30 mL Prostat BID which will provide 2333 kcal, 119 grams protein, and 1086 mL free water (Bolus schedule: 0600, 0900, 1200, 1500, 1800, 2100) - Will order 50 mL free water before and after each bolus to provide additional 600 mL free water/day - RD will continue to monitor for needs  NUTRITION DIAGNOSIS:   Increased nutrient needs related to catabolic illness, cancer and cancer related treatments as evidenced by estimated needs.  GOAL:   Patient will meet greater than or equal to 90% of their needs  MONITOR:   TF tolerance, Weight trends, Labs, I & O's  REASON FOR ASSESSMENT:   Consult Enteral/tube feeding initiation and management  ASSESSMENT:   79 y.o. male with PMH of hypertension, GERD, depression, anxiety, arthritis, cancer of base of tongue (diagnosed in August of this year, currently undergoing chemotherapy as well as radiation treatments, last radiation last week), s/p of G-tube, who presents with altered mental status.  Pt seen for consult. BMI indicates normal weight. Pt has been NPO since admission and has PEG in place. Spoke with [facility] RD via phone concerning pt (she last saw pt 01/21/15). Pt was tolerating 6 cans of Nutren 1.5 via PEG PTA. This formula is not available in formulary; will order TF as outlined above. Will order bolus due to agitation and concern for pt pulling at tubing; will switch to continuous if needed.   Pt confused and unable to provide information; sitter states that daughter was present earlier in the day. No muscle or fat wasting noted to upper body and unable to assess lower body due to pt kicking at blankets. Per chart review, weight has been stable (165-169 lbs) for the past 1.5 months.   Not currently meeting needs. Medications reviewed. Labs reviewed; BUN elevated, Ca: 8.2 mg/dL.    Diet Order:  Diet  NPO time specified Except for: Sips with Meds  Skin:  Reviewed, no issues  Last BM:  PTA  Height:   Ht Readings from Last 1 Encounters:  01/20/15 5\' 9"  (1.753 m)    Weight:   Wt Readings from Last 1 Encounters:  01/29/15 165 lb 9.6 oz (75.116 kg)    Ideal Body Weight:   72.73 kg  BMI:  Body mass index is 24.44 kg/(m^2).  Estimated Nutritional Needs:   Kcal:  2250-2500  Protein:  110-125 grams  Fluid:  2.2-2.5 L/day  EDUCATION NEEDS:   No education needs identified at this time     Jarome Matin, RD, LDN Inpatient Clinical Dietitian Pager # [phone] After hours/weekend pager # [phone]

## 2015-01-29 NOTE — Progress Notes (Signed)
PT Cancellation Note  Patient Details Name: Kenneth Jordan MRN: [id number] DOB: [birth year]   Cancelled Treatment:    Reason Eval/Treat Not Completed: Other (comment) (order changed to start 12/1)   Beva Remund,KATHrine E 01/29/2015, 12:42 PM Carmelia Bake, PT, DPT 01/29/2015 Pager: [phone]

## 2015-01-29 NOTE — Progress Notes (Signed)
OT Cancellation Note  Patient Details Name: Kenneth Jordan MRN: [id number] DOB: January 03, 1936   Cancelled Treatment:    Reason Eval/Treat Not Completed: Fatigue/lethargy limiting ability to participate.  Checked on pt this am and he could not stay awake; if schedule permits, will check back later today.  Natilee Gauer 01/29/2015, 11:33 AM  Lesle Chris, OTR/L [phone] 01/29/2015

## 2015-01-29 NOTE — Progress Notes (Signed)
ANTIBIOTIC CONSULT NOTE - INITIAL  Pharmacy Consult for Vancomycin and Zosyn  Indication: UTI, HCAP  Allergies  Allergen Reactions  . Dilaudid [Hydromorphone Hcl] Other (See Comments)    Hallucinations  . Other Other (See Comments)    Seasonal allergies - sneezing, runny nose, watery eyes    Patient Measurements: Weight: 165 lb 9.6 oz (75.116 kg) Adjusted Body Weight:   Vital Signs: Temp: 98.4 F (36.9 C) (11/30 0502) Temp Source: Oral (11/30 0502) BP: 162/97 mmHg (11/30 0502) Pulse Rate: 110 (11/30 0502) Intake/Output from previous day:   Intake/Output from this shift:    Labs:  Recent Labs  01/28/15 1544 01/29/15 0430  WBC 7.6 6.8  HGB 10.4* 9.2*  PLT 384 296  CREATININE 0.90 0.73   Estimated Creatinine Clearance: 77.3 mL/min (by C-G formula based on Cr of 0.73). No results for input(s): VANCOTROUGH, VANCOPEAK, VANCORANDOM, GENTTROUGH, GENTPEAK, GENTRANDOM, TOBRATROUGH, TOBRAPEAK, TOBRARND, AMIKACINPEAK, AMIKACINTROU, AMIKACIN in the last 72 hours.   Microbiology: Recent Results (from the past 720 hour(s))  Culture, blood (routine x 2)     Status: None   Collection Time: 12/30/14 12:40 PM  Result Value Ref Range Status   Specimen Description BLOOD LEFT ANTECUBITAL  Final   Special Requests BOTTLES DRAWN AEROBIC ONLY 5CC  Final   Culture   Final    NO GROWTH 5 DAYS Performed at [facility]    Report Status 01/04/2015 FINAL  Final  Culture, blood (routine x 2)     Status: None   Collection Time: 12/30/14 12:40 PM  Result Value Ref Range Status   Specimen Description BLOOD RIGHT ANTECUBITAL  Final   Special Requests BOTTLES DRAWN AEROBIC AND ANAEROBIC 5CC  Final   Culture   Final    NO GROWTH 5 DAYS Performed at [facility]    Report Status 01/04/2015 FINAL  Final  Urine culture     Status: None   Collection Time: 12/30/14  2:37 PM  Result Value Ref Range Status   Specimen Description URINE, CLEAN CATCH  Final   Special Requests NONE   Final   Culture   Final    40,000 COLONIES/ml PROTEUS MIRABILIS Performed at [facility]    Report Status 01/02/2015 FINAL  Final   Organism ID, Bacteria PROTEUS MIRABILIS  Final      Susceptibility   Proteus mirabilis - MIC*    AMPICILLIN >=32 RESISTANT Resistant     CEFAZOLIN 8 SENSITIVE Sensitive     CEFTRIAXONE <=1 SENSITIVE Sensitive     CIPROFLOXACIN <=0.25 SENSITIVE Sensitive     GENTAMICIN <=1 SENSITIVE Sensitive     IMIPENEM 8 INTERMEDIATE Intermediate     NITROFURANTOIN 128 RESISTANT Resistant     TRIMETH/SULFA 40 SENSITIVE Sensitive     AMPICILLIN/SULBACTAM 16 INTERMEDIATE Intermediate     PIP/TAZO <=4 SENSITIVE Sensitive     * 40,000 COLONIES/ml PROTEUS MIRABILIS    Medical History: Past Medical History  Diagnosis Date  . 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 Hypertension   . Retinitis pigmentosa of both eyes   . Anxiety   . Depression   . GERD (gastroesophageal reflux disease)     hx of   . Arthritis     shoulders, right knee   . Insomnia 12/24/2014    Medications:  Anti-infectives    Start     Dose/Rate Route Frequency Ordered Stop   01/29/15 1400  piperacillin-tazobactam (ZOSYN) IVPB 3.375 g  3.375 g 12.5 mL/hr over 240 Minutes Intravenous 3 times per day 01/29/15 0424     01/29/15 0500  vancomycin (VANCOCIN) IVPB 1000 mg/200 mL premix     1,000 mg 200 mL/hr over 60 Minutes Intravenous 2 times daily 01/29/15 0424     01/29/15 0430  piperacillin-tazobactam (ZOSYN) IVPB 3.375 g     3.375 g 100 mL/hr over 30 Minutes Intravenous  Once 01/29/15 0424 01/29/15 0537   01/29/15 0000  cefTRIAXone (ROCEPHIN) 1 g in dextrose 5 % 50 mL IVPB  Status:  Discontinued     1 g 100 mL/hr over 30 Minutes Intravenous Every 24 hours 01/28/15 2358 01/29/15 0340   01/28/15 1815  cefTRIAXone (ROCEPHIN) 1 g in dextrose 5 % 50 mL IVPB     1 g 100 mL/hr over 30 Minutes Intravenous  Once 01/28/15 1812 01/28/15 1914     Assessment: Patient with AMS in  ED, found to have acute encephalopathy and sepsis with possible UTI and/or HCAP.  Goal of Therapy:  Vancomycin trough level 15-20 mcg/ml  Zosyn based on renal function Appropriate antibiotic dosing for renal function; eradication of infection   Plan:  Measure antibiotic drug levels at steady state Follow up culture results Vancomycin 1gm iv q12hr  Zosyn 3.375g IV Q8H infused over 4hrs.   Kenneth Jordan 01/29/2015,6:06 AM

## 2015-01-29 NOTE — Progress Notes (Signed)
Progress Note   [name]:[id number] DOB: 10/19/1935 DOA: 01/28/2015 PCP: Pcp Not In System   Brief Narrative:   Kenneth Jordan is an 79 y.o. male  with PMH of hypertension, GERD, depression, anxiety, arthritis, cancer of base of tongue (diagnosed in August of this year, currently undergoing chemotherapy as well as radiation treatments, last radiation last week), s/p of G-tube, who was admitted 01/28/15 with altered mental status and worsening generalized weakness. In ED, patient was found to have tachycardia, tachypnea, sodium 131, renal function okay, normal lactate,negative troponin, WBC 7.6, temperature normal. CT head is negative for acute abnormalities. Chest x-ray showed hypoventilation. CTA of chest showed no acute pulmonary embolism, but has bilateral lower lobe opacity and volume loss. This may represent pneumonia, aspiration, atelectasis.  Assessment/Plan:   Principal problem:  Sepsis secondary to HCAP versus UTI - Met criteria for sepsis on admission. Source likely urinary versus pneumonia. - Continue empiric IV vancomycin and Zosyn. Status post 2.5 L normal saline bolus in the ED. Continue IV fluids. - Follow-up blood cultures, urine culture sputum culture, urinary Legionella and strep pneumonia antigens.  Active problems:  Microcytic anemia, multifactorial - Due to recent chemotherapy, malnutrition. Dilution, infection, antibiotics.  - No transfusion is indicated at this time. - Monitor counts closely. - Transfuse blood to maintain a Hb of 8 g or if the patient is acutely bleeding.  Acute encephalopathy with paranoid behavior - Likely secondary to sepsis vs hyponatremia. Treat infection and monitor mental status. - Haldol given for acute paranoia today.  Cancer of base of tongue (HCC) - Sees Dr. Alvy Bimler. Last seen was on 01/21/15. We'll notify her via epic of patient's admission. - s/p concurrent cetuximab and radiation therapy, tolerating it poorly---  last radiation last week.  - S/P gastrostomy tube (G tube) placement.  Protein calorie malnutrition ([place]) - Ensure ordered.  Dietician consulted for initiation of TF.  GERD - Continue Protonix and carafate.  HTN - Not on meds at home. - IV hydralazine when necessary    DVT Prophylaxis - Lovenox ordered.   Family Communication/Anticipated D/C date and plan/Code Status   Family Communication: Daughter by telephone. Disposition Plan: Likely will need SNF. Anticipated D/C date:   Several more days, still very agitated/paranoid. Code Status: DNR.   IV Access:    Port-A-Cath right chest   Procedures and diagnostic studies:   Ct Head Wo Contrast  01/28/2015  CLINICAL DATA:  78 year old male with altered mental status. History of tongue cancer. EXAM: CT HEAD WITHOUT CONTRAST TECHNIQUE: Contiguous axial images were obtained from the base of the skull through the vertex without intravenous contrast. COMPARISON:  Head CT dated 09/30/2014 FINDINGS: The ventricles are dilated and the sulci are prominent compatible with age-related atrophy. Periventricular and deep white matter hypodensities represent chronic microvascular ischemic changes. There is no intracranial hemorrhage. No mass effect or midline shift identified. The visualized paranasal sinuses are well aerated. There is minimal mucoperiosteal thickening of the mastoid air cells. Multiple calvarial lucencies are stable compared to the prior study and of indeterminate clinical significance. IMPRESSION: No acute intracranial pathology. Age-related atrophy and chronic microvascular ischemic disease. If symptoms persist and there are no contraindications, MRI may provide better evaluation if clinically indicated. Electronically Signed   By: Anner Crete M.D.   On: 01/28/2015 20:50   Ct Angio Chest Pe W/cm &/or Wo Cm  01/29/2015  CLINICAL DATA:  Tachycardia. EXAM: CT ANGIOGRAPHY CHEST WITH CONTRAST TECHNIQUE: Multidetector CT imaging  of  the chest was performed using the standard protocol during bolus administration of intravenous contrast. Multiplanar CT image reconstructions and MIPs were obtained to evaluate the vascular anatomy. CONTRAST:  100 mL Omnipaque 350 intravenous COMPARISON:  Radiographs 01/28/2015 FINDINGS: Cardiovascular: There is good opacification of the pulmonary arteries. There is no pulmonary embolism. The thoracic aorta is normal in caliber and intact. Lungs: Patchy and linear opacities in both lower lobes, with associated volume loss. This could represent multifocal pneumonia. Atelectasis is also possibility. Aspiration not excluded. Central airways: Patent Effusions: None Lymphadenopathy: None Esophagus: Unremarkable Upper abdomen: Unremarkable Musculoskeletal: No significant abnormality. Moderately severe degenerative disc changes are present, greatest in the mid thoracic spine. Review of the MIP images confirms the above findings. IMPRESSION: 1. Negative for acute pulmonary embolism. 2. Bilateral lower lobe opacity and volume loss. This may represent pneumonia, aspiration, atelectasis. Electronically Signed   By: Andreas Newport M.D.   On: 01/29/2015 01:44   Dg Chest Port 1 View  01/28/2015  CLINICAL DATA:  Cancer base of tongue.  Short of breath. EXAM: PORTABLE CHEST 1 VIEW COMPARISON:  12/30/2014 FINDINGS: Hypoventilation with decreased lung volume. Increase in bibasilar atelectasis. Both negative for heart failure or pneumonia or effusion Right jugular Port-A-Cath tip in the SVC Moderate to advanced degenerative change in both shoulder joints. IMPRESSION: Hypoventilation with bibasilar atelectasis. Electronically Signed   By: Franchot Gallo M.D.   On: 01/28/2015 16:22     Medical Consultants:    Oncology: Heath Lark, MD  Palliative Care: Knox Royalty, NP  Anti-Infectives:    Rocephin 01/28/15---> 01/29/15  Vancomycin 01/29/15--->  Zosyn 01/29/15--->   Subjective:    [name] is  agitated and confused, expressing paranoid ideas, restless, attempting to leave room.  Has a Air cabin crew with him.  Very HOH and unable to answer any questions appropriately.    Objective:    Filed Vitals:   01/29/15 0030 01/29/15 0118 01/29/15 0220 01/29/15 0502  BP: 140/76 140/80 156/80 162/97  Pulse:  102 103 110  Temp:   98.2 F (36.8 C) 98.4 F (36.9 C)  TempSrc:   Oral Oral  Resp: _0 Weight:   75.116 kg (165 lb 9.6 oz)   SpO2:  96% 100% 99%   No intake or output data in the 24 hours ending 01/29/15 0820 Filed Weights   01/28/15 1653 01/29/15 0220  Weight: 77.111 kg (170 lb) 75.116 kg (165 lb 9.6 oz)    Exam: Gen:  Agitated, confused. Cardiovascular:  Tachy, No M/R/G Respiratory:  Lungs CTAB Gastrointestinal:  Abdomen soft, NT/ND, + BS Extremities:  No C/E/C   Data Reviewed:    Labs: Basic Metabolic Panel:  Recent Labs Lab 01/28/15 1544 01/29/15 0430  NA 131* 135  K 4.8 4.3  CL 97* 103  CO2 27 25  GLUCOSE 104* 95  BUN 31* 24*  CREATININE 0.90 0.73  CALCIUM 9.0 8.2*   GFR Estimated Creatinine Clearance: 77.3 mL/min (by C-G formula based on Cr of 0.73). Liver Function Tests:  Recent Labs Lab 01/29/15 0430  AST 13*  ALT 9*  ALKPHOS 47  BILITOT 0.3  PROT 5.9*  ALBUMIN 2.6*   Coagulation profile  Recent Labs Lab 01/29/15 0019  INR 1.07    CBC:  Recent Labs Lab 01/28/15 1544 01/29/15 0430  WBC 7.6 6.8  HGB 10.4* 9.2*  HCT 33.9* 30.5*  MCV 80.0 78.8  PLT 384 296   CBG:  Recent Labs Lab 01/29/15 0746  GLUCAP  78   Sepsis Labs:  Recent Labs Lab 01/28/15 1544 01/28/15 1613 01/28/15 1919 01/29/15 0019 01/29/15 0430  PROCALCITON  --   --   --  <0.10  --   WBC 7.6  --   --   --  6.8  LATICACIDVEN  --  1.01 0.94 1.1 0.6   Microbiology No results found for this or any previous visit (from the past 240 hour(s)).   Medications:   . clindamycin  1 application Topical BID  . feeding supplement (ENSURE ENLIVE)   237 mL Oral BID BM  . heparin  5,000 Units Subcutaneous 3 times per day  . hydrocortisone  1 application Topical BID  . metoCLOPramide  10 mg Oral TID AC & HS  . MUSCLE RUB  1 application Topical Daily  . pantoprazole  40 mg Oral Daily  . piperacillin-tazobactam (ZOSYN)  IV  3.375 g Intravenous 3 times per day  . sodium chloride  3 mL Intravenous Q12H  . vancomycin  1,000 mg Intravenous BID   Continuous Infusions: . sodium chloride 75 mL/hr at 01/29/15 0243    Time spent: 35 minutes.  The patient is medically complex with multiple co-morbidities and is at high risk for clinical deterioration and requires high complexity decision making & coordination of care with multiple sub-specialists.    LOS: 0 days   Gonzalo Waymire  [facility] Pager [phone]. If unable to reach me by pager, please call my cell phone at [phone].  *Please refer to [email or web address], password [email or web address] to get updated schedule on who will round on this patient, as hospitalists switch teams weekly. If 7PM-7AM, please contact night-coverage at [email or web address], password [email or web address] for any overnight needs.  01/29/2015, 8:20 AM

## 2015-01-30 ENCOUNTER — Encounter: Payer: Self-pay | Admitting: *Deleted

## 2015-01-30 ENCOUNTER — Encounter (HOSPITAL_COMMUNITY): Payer: Self-pay

## 2015-01-30 DIAGNOSIS — I10 Essential (primary) hypertension: Secondary | ICD-10-CM

## 2015-01-30 LAB — BASIC METABOLIC PANEL
Anion gap: 7 (ref 5–15)
BUN: 15 mg/dL (ref 6–20)
CHLORIDE: 103 mmol/L (ref 101–111)
CO2: 26 mmol/L (ref 22–32)
CREATININE: 0.89 mg/dL (ref 0.61–1.24)
Calcium: 8.6 mg/dL — ABNORMAL LOW (ref 8.9–10.3)
GFR calc non Af Amer: >60 mL/min (ref >60)
Glucose, Bld: 86 mg/dL (ref 65–99)
Potassium: 4.1 mmol/L (ref 3.5–5.1)
Sodium: 136 mmol/L (ref 135–145)

## 2015-01-30 LAB — CBC
HCT: 30.5 % — ABNORMAL LOW (ref 39.0–52.0)
HEMOGLOBIN: 9.4 g/dL — AB (ref 13.0–17.0)
MCH: 24.5 pg — AB (ref 26.0–34.0)
MCHC: 30.8 g/dL (ref 30.0–36.0)
MCV: 79.4 fL (ref 78.0–100.0)
PLATELETS: 348 10*3/uL (ref 150–400)
RBC: 3.84 MIL/uL — AB (ref 4.22–5.81)
RDW: 15.8 % — ABNORMAL HIGH (ref 11.5–15.5)
WBC: 5.8 10*3/uL (ref 4.0–10.5)

## 2015-01-30 LAB — GLUCOSE, CAPILLARY
GLUCOSE-CAPILLARY: 127 mg/dL — AB (ref 65–99)
GLUCOSE-CAPILLARY: 132 mg/dL — AB (ref 65–99)
Glucose-Capillary: 89 mg/dL (ref 65–99)
Glucose-Capillary: 142 mg/dL — ABNORMAL HIGH (ref 65–99)
Glucose-Capillary: 99 mg/dL (ref 65–99)

## 2015-01-30 LAB — URINE CULTURE

## 2015-01-30 MED ORDER — SODIUM CHLORIDE 0.9 % IJ SOLN
10.0000 mL | INTRAMUSCULAR | Status: DC | PRN
  Administered 2015-01-30: 10 mL
  Administered 2015-01-30: 20 mL
  Administered 2015-01-31 – 2015-02-03 (×4): 10 mL
  Filled 2015-01-30 (×6): qty 40

## 2015-01-30 NOTE — Progress Notes (Signed)
Progress Note   Kenneth Jordan EZM:[id number] DOB: 09-27-1935 DOA: 01/28/2015 PCP: Pcp Not In System   Brief Narrative:   Kenneth Jordan is an 79 y.o. male  with PMH of hypertension, GERD, depression, anxiety, arthritis, cancer of base of tongue (diagnosed in August of this year, currently undergoing chemotherapy as well as radiation treatments, last radiation last week), s/p of G-tube, who was admitted 01/28/15 with altered mental status and worsening generalized weakness. In ED, patient was found to have tachycardia, tachypnea, sodium 131, renal function okay, normal lactate,negative troponin, WBC 7.6, temperature normal. CT head is negative for acute abnormalities. Chest x-ray showed hypoventilation. CTA of chest showed no acute pulmonary embolism, but has bilateral lower lobe opacity and volume loss. This may represent pneumonia, aspiration, atelectasis.  Assessment/Plan:   Principal problem:  Sepsis secondary to HCAP versus UTI - Met criteria for sepsis on admission. Source likely urinary versus pneumonia. - Continue empiric IV vancomycin and Zosyn. Status post 2.5 L normal saline bolus in the ED. Continue IV fluids. - Follow-up blood cultures, urine culture sputum culture, urinary Legionella and strep pneumonia antigens.  Active problems:  Microcytic anemia, multifactorial - Due to recent chemotherapy, malnutrition, dilution, infection, antibiotics.  - No transfusion is indicated at this time. - Monitor counts closely. - Transfuse blood to maintain a Hb of 8 g or if the patient is acutely bleeding.  Acute encephalopathy with paranoid behavior - Likely secondary to sepsis vs hyponatremia. Treat infection and monitor mental status. - Haldol given for acute paranoia today.  Cancer of base of tongue ([place]) - Dr. Alvy Bimler consulting. - s/p concurrent cetuximab and radiation therapy, tolerating it poorly--- last radiation last week.  - S/P gastrostomy tube (G tube)  placement.  Protein calorie malnutrition ([place]) - Ensure ordered.  Dietician consulted for initiation of TF.  GERD - Continue Protonix and carafate.  HTN - Not on meds at home. - IV hydralazine when necessary    DVT Prophylaxis - Lovenox ordered.   Family Communication/Anticipated D/C date and plan/Code Status   Family Communication: Daughter by telephone 01/29/15. Disposition Plan: Likely will need SNF. Anticipated D/C date:   Several more days, calmer, but will need appropriate disposition. Code Status: DNR.   IV Access:    Port-A-Cath right chest   Procedures and diagnostic studies:   Ct Head Wo Contrast  01/28/2015  CLINICAL DATA:  79 year old male with altered mental status. History of tongue cancer. EXAM: CT HEAD WITHOUT CONTRAST TECHNIQUE: Contiguous axial images were obtained from the base of the skull through the vertex without intravenous contrast. COMPARISON:  Head CT dated 09/30/2014 FINDINGS: The ventricles are dilated and the sulci are prominent compatible with age-related atrophy. Periventricular and deep white matter hypodensities represent chronic microvascular ischemic changes. There is no intracranial hemorrhage. No mass effect or midline shift identified. The visualized paranasal sinuses are well aerated. There is minimal mucoperiosteal thickening of the mastoid air cells. Multiple calvarial lucencies are stable compared to the prior study and of indeterminate clinical significance. IMPRESSION: No acute intracranial pathology. Age-related atrophy and chronic microvascular ischemic disease. If symptoms persist and there are no contraindications, MRI may provide better evaluation if clinically indicated. Electronically Signed   By: Anner Crete M.D.   On: 01/28/2015 20:50   Ct Angio Chest Pe W/cm &/or Wo Cm  01/29/2015  CLINICAL DATA:  Tachycardia. EXAM: CT ANGIOGRAPHY CHEST WITH CONTRAST TECHNIQUE: Multidetector CT imaging of the chest was performed using  the standard protocol during  bolus administration of intravenous contrast. Multiplanar CT image reconstructions and MIPs were obtained to evaluate the vascular anatomy. CONTRAST:  100 mL Omnipaque 350 intravenous COMPARISON:  Radiographs 01/28/2015 FINDINGS: Cardiovascular: There is good opacification of the pulmonary arteries. There is no pulmonary embolism. The thoracic aorta is normal in caliber and intact. Lungs: Patchy and linear opacities in both lower lobes, with associated volume loss. This could represent multifocal pneumonia. Atelectasis is also possibility. Aspiration not excluded. Central airways: Patent Effusions: None Lymphadenopathy: None Esophagus: Unremarkable Upper abdomen: Unremarkable Musculoskeletal: No significant abnormality. Moderately severe degenerative disc changes are present, greatest in the mid thoracic spine. Review of the MIP images confirms the above findings. IMPRESSION: 1. Negative for acute pulmonary embolism. 2. Bilateral lower lobe opacity and volume loss. This may represent pneumonia, aspiration, atelectasis. Electronically Signed   By: Andreas Newport M.D.   On: 01/29/2015 01:44   Dg Chest Port 1 View  01/28/2015  CLINICAL DATA:  Cancer base of tongue.  Short of breath. EXAM: PORTABLE CHEST 1 VIEW COMPARISON:  12/30/2014 FINDINGS: Hypoventilation with decreased lung volume. Increase in bibasilar atelectasis. Both negative for heart failure or pneumonia or effusion Right jugular Port-A-Cath tip in the SVC Moderate to advanced degenerative change in both shoulder joints. IMPRESSION: Hypoventilation with bibasilar atelectasis. Electronically Signed   By: Franchot Gallo M.D.   On: 01/28/2015 16:22     Medical Consultants:    Oncology: Heath Lark, MD  Palliative Care: Knox Royalty, NP  Anti-Infectives:    Rocephin 01/28/15---> 01/29/15  Vancomycin 01/29/15--->  Zosyn 01/29/15--->   Subjective:   [name] was asleep when I rounded on him and I did  not wake him. Nursing staff reports that he is intermittently agitated and angry, but less paranoid than he was yesterday. Symptoms seem to be exacerbated by visual and auditory deficits.    Objective:    Filed Vitals:   01/29/15 0502 01/29/15 1512 01/30/15 0019 01/30/15 0451  BP: 162/97 132/70 100/47 141/68  Pulse: 110 109 98 96  Temp: 98.4 F (36.9 C) 98 F (36.7 C) 99 F (37.2 C) 98.4 F (36.9 C)  TempSrc: Oral Axillary Axillary Axillary  Resp: _0 Weight:      SpO2: 99% 96% 98% 96%    Intake/Output Summary (Last 24 hours) at 01/30/15 0812 Last data filed at 01/29/15 1815  Gross per 24 hour  Intake 843.75 ml  Output      0 ml  Net 843.75 ml   Filed Weights   01/28/15 1653 01/29/15 0220  Weight: 77.111 kg (170 lb) 75.116 kg (165 lb 9.6 oz)    Exam: Gen:  Asleep. Cardiovascular:  Tachy, No M/R/G Respiratory:  Lungs CTAB Gastrointestinal:  Abdomen soft, NT/ND, + BS Extremities:  No C/E/C   Data Reviewed:    Labs: Basic Metabolic Panel:  Recent Labs Lab 01/28/15 1544 01/29/15 0430 01/30/15 0454  NA 131* 135 136  K 4.8 4.3 4.1  CL 97* 103 103  CO2 _1 GLUCOSE 104* 95 86  BUN 31* 24* 15  CREATININE 0.90 0.73 0.89  CALCIUM 9.0 8.2* 8.6*   GFR Estimated Creatinine Clearance: 69.5 mL/min (by C-G formula based on Cr of 0.89). Liver Function Tests:  Recent Labs Lab 01/29/15 0430  AST 13*  ALT 9*  ALKPHOS 47  BILITOT 0.3  PROT 5.9*  ALBUMIN 2.6*   Coagulation profile  Recent Labs Lab 01/29/15 0019  INR 1.07    CBC:  Recent Labs Lab 01/28/15 1544 01/29/15 0430 01/30/15 0454  WBC 7.6 6.8 5.8  HGB 10.4* 9.2* 9.4*  HCT 33.9* 30.5* 30.5*  MCV 80.0 78.8 79.4  PLT 384 296 348   CBG:  Recent Labs Lab 01/29/15 0746 01/30/15 0021 01/30/15 0446 01/30/15 0745  GLUCAP 78 132* 89 142*   Sepsis Labs:  Recent Labs Lab 01/28/15 1544 01/28/15 1613 01/28/15 1919 01/29/15 0019 01/29/15 0430 01/30/15 0454  PROCALCITON   --   --   --  <0.10  --   --   WBC 7.6  --   --   --  6.8 5.8  LATICACIDVEN  --  1.01 0.94 1.1 0.6  --    Microbiology Recent Results (from the past 240 hour(s))  Blood Culture (routine x 2)     Status: None (Preliminary result)   Collection Time: 01/28/15  4:00 PM  Result Value Ref Range Status   Specimen Description BLOOD RIGHT CHEST PORTA CATH  Final   Special Requests BOTTLES DRAWN AEROBIC AND ANAEROBIC 5.5ML  Final   Culture   Final    NO GROWTH < 24 HOURS Performed at [facility]    Report Status PENDING  Incomplete  Blood Culture (routine x 2)     Status: None (Preliminary result)   Collection Time: 01/28/15  4:54 PM  Result Value Ref Range Status   Specimen Description BLOOD RIGHT ARM  Final   Special Requests BOTTLES DRAWN AEROBIC AND ANAEROBIC 5ML  Final   Culture   Final    NO GROWTH < 24 HOURS Performed at [facility]    Report Status PENDING  Incomplete     Medications:   . clindamycin  1 application Topical BID  . enoxaparin (LOVENOX) injection  40 mg Subcutaneous Q24H  . feeding supplement (ENSURE ENLIVE)  237 mL Oral BID BM  . feeding supplement (OSMOLITE 1.5 CAL)  237 mL Per Tube 6 X Daily  . feeding supplement (PRO-STAT SUGAR FREE 64)  30 mL Per Tube BID  . free water  100 mL Per Tube 6 X Daily  . hydrocortisone  1 application Topical BID  . metoCLOPramide  10 mg Per Tube TID AC  . MUSCLE RUB  1 application Topical Daily  . pantoprazole sodium  40 mg Per Tube Daily  . piperacillin-tazobactam (ZOSYN)  IV  3.375 g Intravenous 3 times per day  . sodium chloride  3 mL Intravenous Q12H  . valproic acid  250 mg Oral BID  . vancomycin  1,000 mg Intravenous BID   Continuous Infusions: . sodium chloride 75 mL/hr at 01/30/15 0330    Time spent: 35 minutes.  The patient is medically complex with multiple co-morbidities and is at high risk for clinical deterioration and requires high complexity decision making & coordination of care with  multiple sub-specialists.    LOS: 1 day   Shequila Neglia  [facility] Pager [phone]. If unable to reach me by pager, please call my cell phone at [phone].  *Please refer to [email or web address], password [email or web address] to get updated schedule on who will round on this patient, as hospitalists switch teams weekly. If 7PM-7AM, please contact night-coverage at [email or web address], password [email or web address] for any overnight needs.  01/30/2015, 8:12 AM

## 2015-01-30 NOTE — Progress Notes (Signed)
  Oncology Nurse Navigator Documentation   Navigator Encounter Type: Other (01/30/15 1015) Patient Visit Type: Inpatient (01/30/15 1015)     Visited patient [name] to check on wellbeing and to provide support.  His dtr Mickel Baas was at bedside.  Dave slept intermittently, was agitated and angry when awake.    Mickel Baas indicated his frustration and anger is b/c he needs a new hearing aid battery which she is arranging to obtain from home.  She verbalized understanding her dad is being treated for PNA, that he will need SNF when discharged.  She is communicating with the [facility] regarding placement when appropriate.  She verbalized he will need 24/7 care in the upcoming weeks.  I addressed her concerns/questions re RT SEs, explained that throat irritation and swelling typically begins to  resolve 2-3 weeks post-RT.  She verbalized understanding. I will continue to follow during this admission.  [name], RN, BSN, [facility] [place] [phone]                    Time Spent with Patient: 30 (01/30/15 1015)

## 2015-01-30 NOTE — Evaluation (Signed)
Occupational Therapy Evaluation Patient Details Name: Kenneth Jordan MRN: [id number] DOB: 20-Jun-1935 Today's Date: 01/30/2015    History of Present Illness pt was admitted for ams/acute encephalopathy.  He has been receiving XRT and chemo for tongue CA.  He has a h/o HTN, depression, anxiety and arthritisi   Clinical Impression   This 79 year old man was admitted for the above.  Pt reluctantly agreeable to evaluation, but this was limited. Will follow in acute and continue to assess/tx.  Pt needs min guard for mobility and mobility related to adls.  Lived with daughter prior to admission:  Uncertain about PLOF. Goals in acute are for supervision/set up level    Follow Up Recommendations  Supervision/Assistance - 24 hour    Equipment Recommendations   (to be further assessed)    Recommendations for Other Services       Precautions / Restrictions Precautions Precautions: Fall Restrictions Weight Bearing Restrictions: No      Mobility Bed Mobility Overal bed mobility: Independent                Transfers Overall transfer level: Needs assistance Equipment used: None (IV pole once standing) Transfers: Sit to/from Stand Sit to Stand: Min guard         General transfer comment: for safety.  Pt moves quickly    Balance                                            ADL Overall ADL's : Needs assistance/impaired                 Upper Body Dressing : Minimal assistance;Sitting (due to lines)          Toilet transfer:  Min guard, simulated, bed (for safety due to increased speed; no LOB)           General ADL Comments: pt needed min guard for standing/walking.  He laid back down in the bed as soon as he sat down, so did not fully assess ADLs:  Pt was cold and wanted to get covered up.  Will further assess on next visit.   Pt states he has had a bad experience with health care providers.     Vision     Perception     Praxis       Pertinent Vitals/Pain       Hand Dominance     Extremity/Trunk Assessment Upper Extremity Assessment Upper Extremity Assessment:  (difficult to assess) asked pt to move arms (written and demonstrated).  He moved very minimally and smiled, as if joking.  Will further assess on next visit.           Communication Communication Communication: HOH (written communication; decreased vision--holds paper to read)   Cognition Arousal/Alertness: Awake/alert Behavior During Therapy: WFL for tasks assessed/performed Overall Cognitive Status: Difficult to assess                     General Comments       Exercises       Shoulder Instructions      Home Living Family/patient expects to be discharged to:: Other (Comment) Living Arrangements: Children                               Additional Comments: has been living  with daughter for past 3 months since he was dx'd with CA.  walks without AD.  Difficulty getting PLOF information due to communication. Able to read written questions      Prior Functioning/Environment          Comments: unknown    OT Diagnosis: Generalized weakness   OT Problem List: Decreased activity tolerance;Decreased knowledge of use of DME or AE   OT Treatment/Interventions: Self-care/ADL training;DME and/or AE instruction;Patient/family education    OT Goals(Current goals can be found in the care plan section) Acute Rehab OT Goals Patient Stated Goal: none stated OT Goal Formulation: Patient unable to participate in goal setting Time For Goal Achievement: 02/06/15 Potential to Achieve Goals: Good ADL Goals Pt Will Transfer to Toilet: with supervision;ambulating;regular height toilet;bedside commode (vs) Pt Will Perform Tub/Shower Transfer: Tub transfer;Shower transfer;ambulating;with supervision (with DME as needed) Additional ADL Goal #1: pt will complete ADL with set up  OT Frequency: Min 2X/week   Barriers to D/C:             Co-evaluation PT/OT/SLP Co-Evaluation/Treatment: Yes   PT goals addressed during session: Mobility/safety with mobility OT goals addressed during session: ADL's and self-care      End of Session    Activity Tolerance: Patient tolerated treatment well Patient left: in bed;with call bell/phone within reach;with bed alarm set   Time: [id number] OT Time Calculation (min): 18 min Charges:  OT General Charges $OT Visit: 1 Procedure G-Codes:    Tayton Decaire 02/10/15, 11:39 AM  Lesle Chris, OTR/L [phone] 02-10-2015

## 2015-01-30 NOTE — Progress Notes (Signed)
NUTRITION NOTE  RD talked with RN yesterday evening concerning issues with connector for Mickey button in order to provide TF.  Spoke with RN this AM who states that connector was obtained and she had no issues providing TF via Mickey this AM.  RD will see pt for full follow-up 12/2.    Jarome Matin, RD, LDN Inpatient Clinical Dietitian Pager # [phone] After hours/weekend pager # [phone]

## 2015-01-30 NOTE — Evaluation (Signed)
Physical Therapy Evaluation Patient Details Name: Kenneth Jordan MRN: [id number] DOB: 11/03/1935 Today's Date: 01/30/2015   History of Present Illness  pt was admitted for ams/acute encephalopathy.  He has been receiving XRT and chemo for tongue CA.  He has a h/o HTN, depression, anxiety and arthritis  Clinical Impression  Pt admitted with above diagnosis. Pt currently with functional limitations due to the deficits listed below (see PT Problem List).  Pt will benefit from skilled PT to increase their independence and safety with mobility to allow discharge to the venue listed below.   Pt reluctantly agreed to therapy today;  Difficult to ascertain prior functional status d/t pt HOH; he seems mildly agitated by PT/OT wanting to work with him--unsure if this is his baseline      Follow Up Recommendations Home health PT;SNF;Supervision/Assistance - 24 hour (vs.)    Equipment Recommendations       Recommendations for Other Services       Precautions / Restrictions Precautions Precautions: Fall Restrictions Weight Bearing Restrictions: No      Mobility  Bed Mobility Overal bed mobility: Independent                Transfers Overall transfer level: Needs assistance Equipment used: None Transfers: Sit to/from Stand Sit to Stand: Min guard         General transfer comment: for safety.  Pt moves quickly; uses IV pole once in standing  Ambulation/Gait Ambulation/Gait assistance: Min guard Ambulation Distance (Feet): 160 Feet Assistive device: Rolling walker (2 wheeled) Gait Pattern/deviations: Step-through pattern;Narrow base of support;Trunk flexed     General Gait Details: pt moves very quickly, uses IV pole for support,  min/guard for safety  Stairs            Wheelchair Mobility    Modified Rankin (Stroke Patients Only)       Balance Overall balance assessment: Needs assistance                           High level balance activites:  Turns;Direction changes High Level Balance Comments: no LOB although moves very quickly             Pertinent Vitals/Pain Pain Assessment: No/denies pain    Home Living Family/patient expects to be discharged to:: Other (Comment) Living Arrangements: Children               Additional Comments: has been living with daughter for past 3 months since he was dx'd with CA.  walks without AD.  Difficulty getting PLOF information due to communication. Able to read written questions    Prior Function Level of Independence: Independent with assistive device(s)         Comments: unknown     Hand Dominance        Extremity/Trunk Assessment   Upper Extremity Assessment: Defer to OT evaluation           Lower Extremity Assessment: Overall WFL for tasks assessed         Communication   Communication: HOH (written communication d/t HOH)  Cognition Arousal/Alertness: Awake/alert Behavior During Therapy: WFL for tasks assessed/performed (pt seems easily agitated by therapists) Overall Cognitive Status: Difficult to assess                      General Comments      Exercises        Assessment/Plan    PT Assessment Patient needs  continued PT services  PT Diagnosis Difficulty walking   PT Problem List Decreased strength;Decreased balance;Decreased mobility;Decreased activity tolerance  PT Treatment Interventions Gait training;Functional mobility training;Therapeutic activities;Therapeutic exercise;Patient/family education;Balance training   PT Goals (Current goals can be found in the Care Plan section) Acute Rehab PT Goals Patient Stated Goal: none stated PT Goal Formulation: With patient Time For Goal Achievement: 02/06/15 Potential to Achieve Goals: Good    Frequency Min 3X/week   Barriers to discharge        Co-evaluation     PT goals addressed during session: Mobility/safety with mobility OT goals addressed during session: ADL's and  self-care       End of Session Equipment Utilized During Treatment: Gait belt Activity Tolerance: Patient tolerated treatment well Patient left: with call bell/phone within reach;in bed;with bed alarm set           Time: [id number] PT Time Calculation (min) (ACUTE ONLY): 20 min   Charges:   PT Evaluation $Initial PT Evaluation Tier I: 1 Procedure     PT G CodesKenyon Ana 02/13/15, 1:31 PM

## 2015-01-31 ENCOUNTER — Other Ambulatory Visit: Payer: Self-pay | Admitting: Hematology and Oncology

## 2015-01-31 LAB — GLUCOSE, CAPILLARY
GLUCOSE-CAPILLARY: 151 mg/dL — AB (ref 65–99)
GLUCOSE-CAPILLARY: 76 mg/dL (ref 65–99)
Glucose-Capillary: 84 mg/dL (ref 65–99)

## 2015-01-31 LAB — LEGIONELLA PNEUMOPHILA SEROGP 1 UR AG: L. pneumophila Serogp 1 Ur Ag: NEGATIVE

## 2015-01-31 MED ORDER — OSMOLITE 1.2 CAL PO LIQD
237.0000 mL | ORAL | Status: DC | PRN
  Filled 2015-01-31: qty 237

## 2015-01-31 NOTE — Progress Notes (Signed)
Kenneth Jordan   DOB:06-10-1935   [id number]   [id number] I have seen the patient, examined him and edited the notes as follows  Subjective: Patient seen and examined. No new events overnight. More alert and oriented. He is afebrile. He still feels weak but reports that physical therapy helps. He denies any respiratory or cardiac complaints. He denies any nausea or vomiting. His gastric tube is functioning properly. He denies any diarrhea. Last bowel movement this morning. He denies any dysuria, and he urinates frequently. He denies any bleeding issues such as hematuria, hematochezia, hemoptysis, or epistaxis.  Scheduled Meds: . clindamycin  1 application Topical BID  . enoxaparin (LOVENOX) injection  40 mg Subcutaneous Q24H  . feeding supplement (OSMOLITE 1.5 CAL)  237 mL Per Tube 6 X Daily  . feeding supplement (PRO-STAT SUGAR FREE 64)  30 mL Per Tube BID  . free water  100 mL Per Tube 6 X Daily  . hydrocortisone  1 application Topical BID  . metoCLOPramide  10 mg Per Tube TID AC  . MUSCLE RUB  1 application Topical Daily  . pantoprazole sodium  40 mg Per Tube Daily  . piperacillin-tazobactam (ZOSYN)  IV  3.375 g Intravenous 3 times per day  . sodium chloride  3 mL Intravenous Q12H  . valproic acid  250 mg Oral BID  . vancomycin  1,000 mg Intravenous BID   Continuous Infusions: . sodium chloride 75 mL/hr at 01/30/15 0330   PRN Meds:.acetaminophen, albuterol, hydrALAZINE, lidocaine-prilocaine, loratadine, morphine, ondansetron **OR** ondansetron (ZOFRAN) IV, sodium chloride, sucralfate  Objective:  Filed Vitals:   01/30/15 1446 01/31/15 0429  BP: 137/62 155/76  Pulse: 110 97  Temp: 98.6 F (37 C) 98.5 F (36.9 C)  Resp: 20 20    Body mass index is 24.44 kg/(m^2). No intake or output data in the 24 hours ending 01/31/15 0806    GENERAL:Patient was sleeping, no apparent distress and comfortable SKIN: skin color, texture, turgor are normal, resolving skin lesion consistent  with cetuximab-induced skin reaction. No ulceration noted. EYES: normal, conjunctiva are pink and non-injected, sclera clear OROPHARYNX: no erythema or thrush. NECK: supple, thyroid normal size, non-tender, without nodularity LYMPH: no palpable lymphadenopathy noted LUNGS: Decreased breath sounds at the bases with normal breathing effort HEART: regular rate & rhythm and no murmurs and no lower extremity edema. Right port is normal ABDOMEN: soft, non-tender and normal bowel sounds. Feeding tube looks normal. Musculoskeletal:no cyanosis of digits and no clubbing  NEURO: He can follow commands without difficulty.   CBG (last 3)   Recent Labs  01/30/15 2344 01/31/15 0422 01/31/15 0727  GLUCAP 127* 84 151*      Labs:   Recent Labs Lab 01/28/15 1544 01/29/15 0430 01/30/15 0454  WBC 7.6 6.8 5.8  HGB 10.4* 9.2* 9.4*  HCT 33.9* 30.5* 30.5*  PLT 384 296 348  MCV 80.0 78.8 79.4  MCH 24.5* 23.8* 24.5*  MCHC 30.7 30.2 30.8  RDW 16.0* 15.8* 15.8*     Chemistries:    Recent Labs Lab 01/28/15 1544 01/29/15 0430 01/30/15 0454  NA 131* 135 136  K 4.8 4.3 4.1  CL 97* 103 103  CO2 27 25 26   GLUCOSE 104* 95 86  BUN 31* 24* 15  CREATININE 0.90 0.73 0.89  CALCIUM 9.0 8.2* 8.6*  AST  --  13*  --   ALT  --  9*  --   ALKPHOS  --  47  --   BILITOT  --  0.3  --  GFR Estimated Creatinine Clearance: 69.5 mL/min (by C-G formula based on Cr of 0.89). Liver Function Tests:  Recent Labs Lab 01/29/15 0430  AST 13*  ALT 9*  ALKPHOS 47  BILITOT 0.3  PROT 5.9*  ALBUMIN 2.6*   Coagulation profile  Recent Labs Lab 01/29/15 0019  INR 1.07    Microbiology Cultures are negative to date  Studies:  No results found.   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 Palliative care is involved to  treat the reversible causes, and for placement in alternative care other than home care, such as skilled nursing facility; discussion is ongoing.   Sepsis, likely pneumonia Cultures were drawn, currently pending. On IV antibiotics with vancomycin and Zosyn Continue current care  Acute encephalopathy, metabolic in nature This is improved Etiology is unknown at this time, in the setting of sepsis, with possible pneumonia versus UTI, as well as hyponatremia CT of the head is negative for acute intracranial abnormalities Cultures are negative to date We will continue to monitor  Possible healthcare associated pneumonia Possible UTI CT angio is suggestive of bilateral opacities, consistent with possible pneumonia He is on IV vancomycin and Zosyn Cultures are pending, will wait for results  Protein calorie malnutrition ([place]) He has progressive worsening calorie malnutrition. Continue tube feeds and IV fluid support Appreciate nutritionist involvement  Anemia, microcytic Due to recent chemotherapy, malnutrition. Dilution, infection, antibiotics  No transfusion is indicated at this time Monitor counts closely Transfuse blood to maintain a Hb of 8 g or if the patient is acutely bleeding  DVT prophylaxis On Lovenox  Code status DNR  Discharge planning He is not ready for discharged due need for IV antibiotics, IV fluids. The patient would need to be discharged to a skilled nursing facility eventually. Home care with outpatient therapy is no longer an option, As his daughter cannot provide the proper care for him due to the complexity of his status. If no clinical improvement is observed, consider transition to hospice/comfort care Other medical issues as per admitting team  Elease Hashimoto 01/31/2015  8:06 AM Medical Oncology and Hematology [place], [place], MD 01/31/2015

## 2015-01-31 NOTE — Progress Notes (Signed)
Nutrition Follow-up  DOCUMENTATION CODES:   Not applicable  INTERVENTION:  - Continue 1 can Osmolite 1.5 six times/day with 30 mL Prostat BID which will provide 2333 kcal, 119 grams protein, and 1086 mL free water (Bolus schedule: 0600, 0900, 1200, 1500, 1800, 2100) - Continue 50 mL free water before and after each bolus to provide additional 600 mL free water/day - RD will continue to monitor for needs  NUTRITION DIAGNOSIS:   Increased nutrient needs related to catabolic illness, cancer and cancer related treatments as evidenced by estimated needs. -ongoing  GOAL:   Patient will meet greater than or equal to 90% of their needs -met with TF regimen  MONITOR:   TF tolerance, Weight trends, Labs, I & O's  ASSESSMENT:   79 y.o. male with PMH of hypertension, GERD, depression, anxiety, arthritis, cancer of base of tongue (diagnosed in August of this year, currently undergoing chemotherapy as well as radiation treatments, last radiation last week), s/p of G-tube, who presents with altered mental status.  12/2 Pt with Mickey button, not a PEG. Pt currently receiving TF as outlined above without issue. This TF regimen meets pt's needs.   Pt resting at time of RD visit and no family or visitors in the room at that time.   Medications reviewed. Labs reviewed; CBGs: 78-151 mg/dL, Ca: 8.6 mg/dL.   11/30 - Pt has been NPO since admission and has PEG in place. - Spoke with [facility] RD via phone concerning pt (she last saw pt 01/21/15).  - Pt was tolerating 6 cans of Nutren 1.5 via PEG PTA.  - This formula is not available in formulary; will order TF as outlined above. - Will order bolus due to agitation and concern for pt pulling at tubing; will switch to continuous if needed.  - Pt confused and unable to provide information; sitter states that daughter was present earlier in the day.  - No muscle or fat wasting noted to upper body and unable to assess lower body due to pt kicking at  blankets.  - Per chart review, weight has been stable (165-169 lbs) for the past 1.5 months.    Diet Order:  Diet NPO time specified Except for: Sips with Meds  Skin:  Reviewed, no issues  Last BM:  12/1  Height:   Ht Readings from Last 1 Encounters:  01/20/15 _0  (1.753 m)    Weight:   Wt Readings from Last 1 Encounters:  01/31/15 165 lb 9.1 oz (75.1 kg)    Ideal Body Weight:  72.73 kg (kg)  BMI:  Body mass index is 24.44 kg/(m^2).  Estimated Nutritional Needs:   Kcal:  2250-2500  Protein:  110-125 grams  Fluid:  2.2-2.5 L/day  EDUCATION NEEDS:   No education needs identified at this time     Jarome Matin, RD, LDN Inpatient Clinical Dietitian Pager # [phone] After hours/weekend pager # [phone]

## 2015-01-31 NOTE — Progress Notes (Signed)
Progress Note   Kenneth Jordan [id number] DOB: 07/25/1935 DOA: 01/28/2015 PCP: Pcp Not In System   Brief Narrative:   Kenneth Jordan is an 79 y.o. male  with PMH of hypertension, GERD, depression, anxiety, arthritis, cancer of base of tongue (diagnosed in August of this year, currently undergoing chemotherapy as well as radiation treatments, last radiation last week), s/p of G-tube, who was admitted 01/28/15 with altered mental status and worsening generalized weakness. In ED, patient was found to have tachycardia, tachypnea, sodium 131, renal function okay, normal lactate,negative troponin, WBC 7.6, temperature normal. CT head is negative for acute abnormalities. Chest x-ray showed hypoventilation. CTA of chest showed no acute pulmonary embolism, but has bilateral lower lobe opacity and volume loss. This may represent pneumonia, aspiration, atelectasis.  Assessment/Plan:   Principal problem:  Sepsis secondary to HCAP versus UTI - Met criteria for sepsis on admission. Source likely urinary versus pneumonia. - Continue empiric IV vancomycin and Zosyn. Status post 2.5 L normal saline bolus in the ED.  - Blood cultures negative to date. Urine cultures grew multiple species of bacteria. - Strep pneumonia antigen negative. Legionella still pending. - D/C antibiotics and IVF, moving toward comfort care.  Active problems:  Microcytic anemia, multifactorial - Due to recent chemotherapy, malnutrition, dilution, infection, antibiotics.  - No transfusion is indicated at this time. - No further lab draws.  Acute encephalopathy with paranoid behavior / delirium - Likely secondary to sepsis vs hyponatremia.  - Haldol given for acute paranoia recently but mental status appears to be slowly improving. - Continue depakote.  Cancer of base of tongue ([place]) - Dr. Alvy Bimler consulting. - s/p concurrent cetuximab and radiation therapy, tolerated it poorly. No further treatment. - S/P gastrostomy  tube (G tube) placement.  Protein calorie malnutrition ([place]) - Change TF to PRN only hunger or patient request.  GERD - Carafate PRN.  HTN - Not on meds at home.    DVT Prophylaxis - D/C Lovenox in light of comfort measures.   Family Communication/Anticipated D/C date and plan/Code Status   Family Communication: Lengthy discussion re: goals of care with [name] (daughter). Disposition Plan: Likely will need SNF versus ALF versus residential hospice. Anticipated D/C date:   02/03/15, watch over weekend to determine appropriate disposition, given move toward comfort care. Code Status: DNR.   IV Access:    Port-A-Cath right chest   Procedures and diagnostic studies:   Ct Head Wo Contrast  01/28/2015  CLINICAL DATA:  79 year old male with altered mental status. History of tongue cancer. EXAM: CT HEAD WITHOUT CONTRAST TECHNIQUE: Contiguous axial images were obtained from the base of the skull through the vertex without intravenous contrast. COMPARISON:  Head CT dated 09/30/2014 FINDINGS: The ventricles are dilated and the sulci are prominent compatible with age-related atrophy. Periventricular and deep white matter hypodensities represent chronic microvascular ischemic changes. There is no intracranial hemorrhage. No mass effect or midline shift identified. The visualized paranasal sinuses are well aerated. There is minimal mucoperiosteal thickening of the mastoid air cells. Multiple calvarial lucencies are stable compared to the prior study and of indeterminate clinical significance. IMPRESSION: No acute intracranial pathology. Age-related atrophy and chronic microvascular ischemic disease. If symptoms persist and there are no contraindications, MRI may provide better evaluation if clinically indicated. Electronically Signed   By: Anner Crete M.D.   On: 01/28/2015 20:50   Ct Angio Chest Pe W/cm &/or Wo Cm  01/29/2015  CLINICAL DATA:  Tachycardia. EXAM: CT ANGIOGRAPHY CHEST WITH  CONTRAST TECHNIQUE: Multidetector CT imaging of the chest was performed using the standard protocol during bolus administration of intravenous contrast. Multiplanar CT image reconstructions and MIPs were obtained to evaluate the vascular anatomy. CONTRAST:  100 mL Omnipaque 350 intravenous COMPARISON:  Radiographs 01/28/2015 FINDINGS: Cardiovascular: There is good opacification of the pulmonary arteries. There is no pulmonary embolism. The thoracic aorta is normal in caliber and intact. Lungs: Patchy and linear opacities in both lower lobes, with associated volume loss. This could represent multifocal pneumonia. Atelectasis is also possibility. Aspiration not excluded. Central airways: Patent Effusions: None Lymphadenopathy: None Esophagus: Unremarkable Upper abdomen: Unremarkable Musculoskeletal: No significant abnormality. Moderately severe degenerative disc changes are present, greatest in the mid thoracic spine. Review of the MIP images confirms the above findings. IMPRESSION: 1. Negative for acute pulmonary embolism. 2. Bilateral lower lobe opacity and volume loss. This may represent pneumonia, aspiration, atelectasis. Electronically Signed   By: Andreas Newport M.D.   On: 01/29/2015 01:44   Dg Chest Port 1 View  01/28/2015  CLINICAL DATA:  Cancer base of tongue.  Short of breath. EXAM: PORTABLE CHEST 1 VIEW COMPARISON:  12/30/2014 FINDINGS: Hypoventilation with decreased lung volume. Increase in bibasilar atelectasis. Both negative for heart failure or pneumonia or effusion Right jugular Port-A-Cath tip in the SVC Moderate to advanced degenerative change in both shoulder joints. IMPRESSION: Hypoventilation with bibasilar atelectasis. Electronically Signed   By: Franchot Gallo M.D.   On: 01/28/2015 16:22     Medical Consultants:    Oncology: Heath Lark, MD  Palliative Care: Knox Royalty, NP  Anti-Infectives:    Rocephin 01/28/15---> 01/29/15  Vancomycin 01/29/15--->01/31/15  Zosyn  01/29/15--->01/31/15    Subjective:   Kenneth Jordan is a bit calmer today.  No pain or dyspnea.  No nausea or vomiting.  Still paranoid and angry at times.    Objective:    Filed Vitals:   01/30/15 1446 01/31/15 0257 01/31/15 0301 01/31/15 0429  BP: 137/62   155/76  Pulse: 110   97  Temp: 98.6 F (37 C)   98.5 F (36.9 C)  TempSrc: Oral   Oral  Resp: 20   20  Weight:  75.1 kg (165 lb 9.1 oz) 75.1 kg (165 lb 9.1 oz)   SpO2: 100%   99%   No intake or output data in the 24 hours ending 01/31/15 0822 Filed Weights   01/29/15 0220 01/31/15 0257 01/31/15 0301  Weight: 75.116 kg (165 lb 9.6 oz) 75.1 kg (165 lb 9.1 oz) 75.1 kg (165 lb 9.1 oz)    Exam: Gen:  Calm. Cardiovascular:  Tachy, No M/R/G Respiratory:  Lungs CTAB Gastrointestinal:  Abdomen soft, NT/ND, + BS Extremities:  No C/E/C   Data Reviewed:    Labs: Basic Metabolic Panel:  Recent Labs Lab 01/28/15 1544 01/29/15 0430 01/30/15 0454  NA 131* 135 136  K 4.8 4.3 4.1  CL 97* 103 103  CO2 27 25 26   GLUCOSE 104* 95 86  BUN 31* 24* 15  CREATININE 0.90 0.73 0.89  CALCIUM 9.0 8.2* 8.6*   GFR Estimated Creatinine Clearance: 69.5 mL/min (by C-G formula based on Cr of 0.89). Liver Function Tests:  Recent Labs Lab 01/29/15 0430  AST 13*  ALT 9*  ALKPHOS 47  BILITOT 0.3  PROT 5.9*  ALBUMIN 2.6*   Coagulation profile  Recent Labs Lab 01/29/15 0019  INR 1.07    CBC:  Recent Labs Lab 01/28/15 1544 01/29/15 0430 01/30/15 0454  WBC 7.6 6.8 5.8  HGB 10.4* 9.2* 9.4*  HCT 33.9* 30.5* 30.5*  MCV 80.0 78.8 79.4  PLT 384 296 348   CBG:  Recent Labs Lab 01/30/15 0745 01/30/15 2134 01/30/15 2344 01/31/15 0422 01/31/15 0727  GLUCAP 142* 99 127* 84 151*   Sepsis Labs:  Recent Labs Lab 01/28/15 1544 01/28/15 1613 01/28/15 1919 01/29/15 0019 01/29/15 0430 01/30/15 0454  PROCALCITON  --   --   --  <0.10  --   --   WBC 7.6  --   --   --  6.8 5.8  LATICACIDVEN  --  1.01 0.94 1.1 0.6   --    Microbiology Recent Results (from the past 240 hour(s))  Blood Culture (routine x 2)     Status: None (Preliminary result)   Collection Time: 01/28/15  4:00 PM  Result Value Ref Range Status   Specimen Description BLOOD RIGHT CHEST PORTA CATH  Final   Special Requests BOTTLES DRAWN AEROBIC AND ANAEROBIC 5.5ML  Final   Culture   Final    NO GROWTH 2 DAYS Performed at [facility]    Report Status PENDING  Incomplete  Blood Culture (routine x 2)     Status: None (Preliminary result)   Collection Time: 01/28/15  4:54 PM  Result Value Ref Range Status   Specimen Description BLOOD RIGHT ARM  Final   Special Requests BOTTLES DRAWN AEROBIC AND ANAEROBIC 5ML  Final   Culture   Final    NO GROWTH 2 DAYS Performed at [facility]    Report Status PENDING  Incomplete  Urine culture     Status: None   Collection Time: 01/28/15 10:20 PM  Result Value Ref Range Status   Specimen Description URINE, CATHETERIZED  Final   Special Requests NONE  Final   Culture   Final    MULTIPLE SPECIES PRESENT, SUGGEST RECOLLECTION Performed at [facility]    Report Status 01/30/2015 FINAL  Final     Medications:   . clindamycin  1 application Topical BID  . enoxaparin (LOVENOX) injection  40 mg Subcutaneous Q24H  . feeding supplement (OSMOLITE 1.5 CAL)  237 mL Per Tube 6 X Daily  . feeding supplement (PRO-STAT SUGAR FREE 64)  30 mL Per Tube BID  . free water  100 mL Per Tube 6 X Daily  . hydrocortisone  1 application Topical BID  . metoCLOPramide  10 mg Per Tube TID AC  . MUSCLE RUB  1 application Topical Daily  . pantoprazole sodium  40 mg Per Tube Daily  . piperacillin-tazobactam (ZOSYN)  IV  3.375 g Intravenous 3 times per day  . sodium chloride  3 mL Intravenous Q12H  . valproic acid  250 mg Oral BID  . vancomycin  1,000 mg Intravenous BID   Continuous Infusions: . sodium chloride 75 mL/hr at 01/30/15 0330    Time spent: 65 minutes with > 50% of time  discussing current diagnostic test results, clinical impression and plan of care, goals of care with the patient's daughter, [name], and the palliative care team.  LOS: 2 days     Krosby Ritchie  [facility] Pager [phone]. If unable to reach me by pager, please call my cell phone at [phone].  *Please refer to [email or web address], password [email or web address] to get updated schedule on who will round on this patient, as hospitalists switch teams weekly. If 7PM-7AM, please contact night-coverage at [email or web address], password [email or web address] for any overnight needs.  01/31/2015, 8:22 AM

## 2015-01-31 NOTE — Progress Notes (Signed)
Daily Progress Note   Patient Name: Kenneth Jordan       Date: 01/31/2015 DOB: January 22, 1936  Age: 79 y.o. MRN#: [id number] Attending Physician: Venetia Maxon Rama, MD Primary Care Physician: Pcp Not In System Admit Date: 01/28/2015  Reason for Consultation/Follow-up: Establishing goals of care and Psychosocial/spiritual support  Subjective:  - continued conversation with daughter Kenneth Jordan regardring [place] and anticipatory care needs.  She verbalizes her struggle with being the main decision maker in her father's care and the fact that she cannot care for him at home anymore "his care needs are too much"  -today Kenneth Jordan is able to verbalize her desire to de-escalate her father life prolonging interventions of artificial feeding and hydration and antibiotic use.  TF will be offered on a prn basis at patient's request  Dr Rama participating in this mornings discussion, decision to re-evaluate on Monday.  It is likely this patient will rapidly decline.    Length of Stay: 2 days  Current Medications: Scheduled Meds:  . clindamycin  1 application Topical BID  . hydrocortisone  1 application Topical BID  . MUSCLE RUB  1 application Topical Daily  . sodium chloride  3 mL Intravenous Q12H  . valproic acid  250 mg Oral BID    Continuous Infusions:    PRN Meds: acetaminophen, albuterol, feeding supplement (OSMOLITE 1.2 CAL), lidocaine-prilocaine, loratadine, morphine, ondansetron **OR** ondansetron (ZOFRAN) IV, sodium chloride, sucralfate  Physical Exam: Physical Exam  Constitutional: He appears well-developed.  HENT:  Head: Normocephalic.  Cardiovascular: Normal rate, regular rhythm and normal heart sounds.   Pulmonary/Chest: Effort normal and breath sounds normal.  Abdominal: Soft. Bowel  sounds are normal.  Neurological: He is alert.  intermittently confused  Skin: Skin is warm and dry.                Vital Signs: BP 155/76 mmHg  Pulse 97  Temp(Src) 98.5 F (36.9 C) (Oral)  Resp 20  Wt 75.1 kg (165 lb 9.1 oz)  SpO2 99% SpO2: SpO2: 99 % O2 Device: O2 Device: Not Delivered O2 Flow Rate:    Intake/output summary: No intake or output data in the 24 hours ending 01/31/15 1504 LBM: Last BM Date: 01/31/15 Baseline Weight: Weight: 77.111 kg (170 lb) Most recent weight: Weight: 75.1 kg (165 lb 9.1 oz)  Palliative Assessment/Data: Flowsheet Rows        Most Recent Value   Intake Tab    Referral Department  Hospitalist   Unit at Time of Referral  Med/Surg Unit   Palliative Care Primary Diagnosis  Cancer   Date Notified  01/29/15   Palliative Care Type  New Palliative care   Reason for referral  Clarify Goals of Care, Counsel Regarding Hospice, Psychosocial or Spiritual support, Advance Care Planning   Date of Admission  01/28/15   Date first seen by Palliative Care  01/29/15   # of days Palliative referral response time  0 Day(s)   # of days IP prior to Palliative referral  1   Clinical Assessment    Palliative Performance Scale Score  30%   Psychosocial & Spiritual Assessment    Palliative Care Outcomes       Additional Data Reviewed: CBC    Component Value Date/Time   WBC 5.8 01/30/2015 0454   WBC 9.2 01/13/2015 1558   RBC 3.84* 01/30/2015 0454   RBC 4.44 01/13/2015 1558   HGB 9.4* 01/30/2015 0454   HGB 10.7* 01/13/2015 1558   HCT 30.5* 01/30/2015 0454   HCT 34.2* 01/13/2015 1558   PLT 348 01/30/2015 0454   PLT 495* 01/13/2015 1558   MCV 79.4 01/30/2015 0454   MCV 77.0* 01/13/2015 1558   MCH 24.5* 01/30/2015 0454   MCH 24.1* 01/13/2015 1558   MCHC 30.8 01/30/2015 0454   MCHC 31.2* 01/13/2015 1558   RDW 15.8* 01/30/2015 0454   RDW 15.7* 01/13/2015 1558   LYMPHSABS 1.6 01/13/2015 1558   LYMPHSABS 0.7 12/30/2014 1237   MONOABS 1.2*  01/13/2015 1558   MONOABS 0.9 12/30/2014 1237   EOSABS 0.4 01/13/2015 1558   EOSABS 0.0 12/30/2014 1237   BASOSABS 0.1 01/13/2015 1558   BASOSABS 0.0 12/30/2014 1237    CMP     Component Value Date/Time   NA 136 01/30/2015 0454   NA 139 01/13/2015 1557   K 4.1 01/30/2015 0454   K 4.9 01/13/2015 1557   CL 103 01/30/2015 0454   CO2 26 01/30/2015 0454   CO2 24 01/13/2015 1557   GLUCOSE 86 01/30/2015 0454   GLUCOSE 75 01/13/2015 1557   BUN 15 01/30/2015 0454   BUN 30.0* 01/13/2015 1557   CREATININE 0.89 01/30/2015 0454   CREATININE 0.8 01/13/2015 1557   CALCIUM 8.6* 01/30/2015 0454   CALCIUM 9.5 01/13/2015 1557   PROT 5.9* 01/29/2015 0430   PROT 6.6 01/13/2015 1557   ALBUMIN 2.6* 01/29/2015 0430   ALBUMIN 2.6* 01/13/2015 1557   AST 13* 01/29/2015 0430   AST 23 01/13/2015 1557   ALT 9* 01/29/2015 0430   ALT 16 01/13/2015 1557   ALKPHOS 47 01/29/2015 0430   ALKPHOS 71 01/13/2015 1557   BILITOT 0.3 01/29/2015 0430   BILITOT <0.30 01/13/2015 1557   GFRNONAA >60 01/30/2015 0454   GFRAA >60 01/30/2015 0454       Problem List:  Patient Active Problem List   Diagnosis Date Noted  . HCAP (healthcare-associated pneumonia) 01/29/2015  . Palliative care encounter 01/29/2015  . DNR (do not resuscitate) discussion 01/29/2015  . Hypertension   . Altered mental status   . Tachycardia 01/28/2015  . Heartburn symptom 01/15/2015  . Nasal congestion with rhinorrhea 01/07/2015  . Depression, major, recurrent, mild ([place]) 01/07/2015  . UTI (urinary tract infection) 01/02/2015  . Fever in adult 12/30/2014  . Oral thrush 12/30/2014  . Acute encephalopathy 12/30/2014  . Sepsis (  [place]) 12/30/2014  . Microcytic anemia 12/30/2014  . Protein calorie malnutrition ([place]) 12/25/2014  . Insomnia 12/24/2014  . 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 Rash, skin 12/10/2014  . Throat pain in adult 12/10/2014  . Hypersensitivity reaction 12/04/2014  . S/P gastrostomy tube (G tube) placement,  follow-up exam 12/02/2014  . Drug-induced hypotension 12/02/2014  . Constipation due to opioid therapy 12/02/2014  . Cancer of base of tongue ([place]) 11/08/2014  . Weight loss 11/08/2014  . Hemoptysis 11/08/2014  . Dysphagia 11/08/2014     Palliative Care Assessment & Plan    1.Code Status:  DNR      Code Status Orders        Start     Ordered   01/29/15 1114  Do not attempt resuscitation (DNR)   Continuous    Question Answer Comment  In the event of cardiac or respiratory ARREST Do not call a "code blue"   In the event of cardiac or respiratory ARREST Do not perform Intubation, CPR, defibrillation or ACLS   In the event of cardiac or respiratory ARREST Use medication by any route, position, wound care, and other measures to relive pain and suffering. May use oxygen, suction and manual treatment of airway obstruction as needed for comfort.      01/29/15 1113      Additional Recommendations:  Psycho-social Needs: Education on Hospice and Grief/Bereavement Support    Palliative Prophylaxis:   Aspiration, Bowel Regimen, Delirium Protocol, Frequent Pain Assessment and Oral Care  Prognosis: Pending outcomes , reassess  on Monday morning   Discharge Planning:            Pending  Care plan was discussed with Dr Rockne Menghini  Thank you for allowing the Palliative Medicine Team to assist in the care of this patient.   Time In: 0930 Time Out: 0955 Total Time 25 min Prolonged Time Billed  no         Knox Royalty, NP  01/31/2015, 3:04 PM  Please contact Palliative Medicine Team phone at [phone] for questions and concerns.

## 2015-02-01 NOTE — Progress Notes (Signed)
Progress Note   Kenneth Jordan ERX:[id number] DOB: 09/11/1935 DOA: 01/28/2015 PCP: [facility] Donning Administration   Brief Narrative:   Kenneth Jordan is an 79 y.o. male  with PMH of hypertension, GERD, depression, anxiety, arthritis, cancer of base of tongue (diagnosed in August of this year, currently undergoing chemotherapy as well as radiation treatments, last radiation last week), s/p of G-tube, who was admitted 01/28/15 with altered mental status and worsening generalized weakness. In ED, patient was found to have tachycardia, tachypnea, sodium 131, renal function okay, normal lactate,negative troponin, WBC 7.6, temperature normal. CT head is negative for acute abnormalities. Chest x-ray showed hypoventilation. CTA of chest showed no acute pulmonary embolism, but has bilateral lower lobe opacity and volume loss. This may represent pneumonia, aspiration, atelectasis.  Assessment/Plan:   Principal problem:  Sepsis secondary to HCAP versus UTI - Met criteria for sepsis on admission. Source likely urinary versus pneumonia. - Blood cultures negative to date. Urine cultures grew multiple species of bacteria. - Status post fluid volume resuscitation and 4 days of IV antibiotics which have now been discontinued. - Continue comfort care.  Active problems:  Microcytic anemia, multifactorial - Due to recent chemotherapy, malnutrition, dilution, infection, antibiotics.  - No transfusion is indicated at this time. - No further lab draws.  Acute encephalopathy with paranoid behavior / delirium - Likely secondary to sepsis vs hyponatremia.  - Haldol given for acute paranoia recently but mental status appears to be slowly improving. - Continue depakote.  Cancer of base of tongue ([place]) - Dr. Alvy Bimler consulting. - s/p concurrent cetuximab and radiation therapy, tolerated it poorly. No further treatment. - S/P gastrostomy tube (G tube) placement.  Protein calorie malnutrition ([place]) - Tube  feeds PRN only hunger or patient request.  GERD - Carafate PRN.  HTN - Not on meds at home.    DVT Prophylaxis - D/C Lovenox in light of comfort measures.   Family Communication/Anticipated D/C date and plan/Code Status   Family Communication: Lengthy discussion re: goals of care with [name] (daughter). Disposition Plan: Likely will need SNF versus ALF versus residential hospice. Anticipated D/C date:   02/03/15, watch over weekend to determine appropriate disposition, given move toward comfort care. Code Status: DNR.   IV Access:    Port-A-Cath right chest   Procedures and diagnostic studies:   Ct Head Wo Contrast  01/28/2015  CLINICAL DATA:  79 year old male with altered mental status. History of tongue cancer. EXAM: CT HEAD WITHOUT CONTRAST TECHNIQUE: Contiguous axial images were obtained from the base of the skull through the vertex without intravenous contrast. COMPARISON:  Head CT dated 09/30/2014 FINDINGS: The ventricles are dilated and the sulci are prominent compatible with age-related atrophy. Periventricular and deep white matter hypodensities represent chronic microvascular ischemic changes. There is no intracranial hemorrhage. No mass effect or midline shift identified. The visualized paranasal sinuses are well aerated. There is minimal mucoperiosteal thickening of the mastoid air cells. Multiple calvarial lucencies are stable compared to the prior study and of indeterminate clinical significance. IMPRESSION: No acute intracranial pathology. Age-related atrophy and chronic microvascular ischemic disease. If symptoms persist and there are no contraindications, MRI may provide better evaluation if clinically indicated. Electronically Signed   By: Anner Crete M.D.   On: 01/28/2015 20:50   Ct Angio Chest Pe W/cm &/or Wo Cm  01/29/2015  CLINICAL DATA:  Tachycardia. EXAM: CT ANGIOGRAPHY CHEST WITH CONTRAST TECHNIQUE: Multidetector CT imaging of the chest was performed  using the standard protocol during bolus  administration of intravenous contrast. Multiplanar CT image reconstructions and MIPs were obtained to evaluate the vascular anatomy. CONTRAST:  100 mL Omnipaque 350 intravenous COMPARISON:  Radiographs 01/28/2015 FINDINGS: Cardiovascular: There is good opacification of the pulmonary arteries. There is no pulmonary embolism. The thoracic aorta is normal in caliber and intact. Lungs: Patchy and linear opacities in both lower lobes, with associated volume loss. This could represent multifocal pneumonia. Atelectasis is also possibility. Aspiration not excluded. Central airways: Patent Effusions: None Lymphadenopathy: None Esophagus: Unremarkable Upper abdomen: Unremarkable Musculoskeletal: No significant abnormality. Moderately severe degenerative disc changes are present, greatest in the mid thoracic spine. Review of the MIP images confirms the above findings. IMPRESSION: 1. Negative for acute pulmonary embolism. 2. Bilateral lower lobe opacity and volume loss. This may represent pneumonia, aspiration, atelectasis. Electronically Signed   By: Andreas Newport M.D.   On: 01/29/2015 01:44   Dg Chest Port 1 View  01/28/2015  CLINICAL DATA:  Cancer base of tongue.  Short of breath. EXAM: PORTABLE CHEST 1 VIEW COMPARISON:  12/30/2014 FINDINGS: Hypoventilation with decreased lung volume. Increase in bibasilar atelectasis. Both negative for heart failure or pneumonia or effusion Right jugular Port-A-Cath tip in the SVC Moderate to advanced degenerative change in both shoulder joints. IMPRESSION: Hypoventilation with bibasilar atelectasis. Electronically Signed   By: Franchot Gallo M.D.   On: 01/28/2015 16:22     Medical Consultants:    Oncology: Heath Lark, MD  Palliative Care: Knox Royalty, NP  Anti-Infectives:    Rocephin 01/28/15---> 01/29/15  Vancomycin 01/29/15--->01/31/15  Zosyn 01/29/15--->01/31/15    Subjective:   Kenneth Jordan is sleeping.  No  reports of pain or dyspnea.  No nausea or vomiting. Mood is calmer. Bowels have moved twice in the past 24 hours.  Objective:    Filed Vitals:   01/31/15 0429 01/31/15 1531 01/31/15 2148 02/01/15 0600  BP: 155/76 173/89 153/79 160/82  Pulse: 97 92 87 97  Temp: 98.5 F (36.9 C) 98.4 F (36.9 C) 98.9 F (37.2 C) 98 F (36.7 C)  TempSrc: Oral Oral Oral Oral  Resp: 20 22 20 20   Weight:      SpO2: 99% 99% 99% 99%    Intake/Output Summary (Last 24 hours) at 02/01/15 0758 Last data filed at 02/01/15 0520  Gross per 24 hour  Intake    110 ml  Output      0 ml  Net    110 ml   Filed Weights   01/29/15 0220 01/31/15 0257 01/31/15 0301  Weight: 75.116 kg (165 lb 9.6 oz) 75.1 kg (165 lb 9.1 oz) 75.1 kg (165 lb 9.1 oz)    Exam: Gen:  Asleep with sitter at bedside. Cardiovascular:  Tachy, No M/R/G Respiratory:  Lungs CTAB Gastrointestinal:  Abdomen soft, NT/ND, + BS Extremities:  No C/E/C   Data Reviewed:    Labs: Basic Metabolic Panel:  Recent Labs Lab 01/28/15 1544 01/29/15 0430 01/30/15 0454  NA 131* 135 136  K 4.8 4.3 4.1  CL 97* 103 103  CO2 27 25 26   GLUCOSE 104* 95 86  BUN 31* 24* 15  CREATININE 0.90 0.73 0.89  CALCIUM 9.0 8.2* 8.6*   GFR Estimated Creatinine Clearance: 69.5 mL/min (by C-G formula based on Cr of 0.89). Liver Function Tests:  Recent Labs Lab 01/29/15 0430  AST 13*  ALT 9*  ALKPHOS 47  BILITOT 0.3  PROT 5.9*  ALBUMIN 2.6*   Coagulation profile  Recent Labs Lab 01/29/15 0019  INR 1.07  CBC:  Recent Labs Lab 01/28/15 1544 01/29/15 0430 01/30/15 0454  WBC 7.6 6.8 5.8  HGB 10.4* 9.2* 9.4*  HCT 33.9* 30.5* 30.5*  MCV 80.0 78.8 79.4  PLT 384 296 348   CBG:  Recent Labs Lab 01/30/15 2134 01/30/15 2344 01/31/15 0422 01/31/15 0727 01/31/15 1156  GLUCAP 99 127* 84 151* 76   Sepsis Labs:  Recent Labs Lab 01/28/15 1544 01/28/15 1613 01/28/15 1919 01/29/15 0019 01/29/15 0430 01/30/15 0454  PROCALCITON  --    --   --  <0.10  --   --   WBC 7.6  --   --   --  6.8 5.8  LATICACIDVEN  --  1.01 0.94 1.1 0.6  --    Microbiology Recent Results (from the past 240 hour(s))  Blood Culture (routine x 2)     Status: None (Preliminary result)   Collection Time: 01/28/15  4:00 PM  Result Value Ref Range Status   Specimen Description BLOOD RIGHT CHEST PORTA CATH  Final   Special Requests BOTTLES DRAWN AEROBIC AND ANAEROBIC 5.5ML  Final   Culture   Final    NO GROWTH 3 DAYS Performed at [facility]    Report Status PENDING  Incomplete  Blood Culture (routine x 2)     Status: None (Preliminary result)   Collection Time: 01/28/15  4:54 PM  Result Value Ref Range Status   Specimen Description BLOOD RIGHT ARM  Final   Special Requests BOTTLES DRAWN AEROBIC AND ANAEROBIC 5ML  Final   Culture   Final    NO GROWTH 3 DAYS Performed at [facility]    Report Status PENDING  Incomplete  Urine culture     Status: None   Collection Time: 01/28/15 10:20 PM  Result Value Ref Range Status   Specimen Description URINE, CATHETERIZED  Final   Special Requests NONE  Final   Culture   Final    MULTIPLE SPECIES PRESENT, SUGGEST RECOLLECTION Performed at [facility]    Report Status 01/30/2015 FINAL  Final     Medications:   . clindamycin  1 application Topical BID  . hydrocortisone  1 application Topical BID  . MUSCLE RUB  1 application Topical Daily  . sodium chloride  3 mL Intravenous Q12H  . valproic acid  250 mg Oral BID   Continuous Infusions:    Time spent: 25 minutes .  LOS: 3 days     [place] [facility] Pager [phone]. If unable to reach me by pager, please call my cell phone at [phone].  *Please refer to [email or web address], password [email or web address] to get updated schedule on who will round on this patient, as hospitalists switch teams weekly. If 7PM-7AM, please contact night-coverage at [email or web address], password [email or web address] for any overnight needs.  02/01/2015, 7:58 AM

## 2015-02-02 LAB — CULTURE, BLOOD (ROUTINE X 2)
CULTURE: NO GROWTH
Culture: NO GROWTH

## 2015-02-02 NOTE — Progress Notes (Signed)
Progress Note   Kenneth Jordan OTL:[id number] DOB: January 08, 1936 DOA: 01/28/2015 PCP: Murvin Donning Administration   Brief Narrative:   FAHIM KATS is an 79 y.o. male  with PMH of hypertension, GERD, depression, anxiety, arthritis, cancer of base of tongue (diagnosed in August of this year, currently undergoing chemotherapy as well as radiation treatments, last radiation last week), s/p of G-tube, who was admitted 01/28/15 with altered mental status and worsening generalized weakness. In ED, patient was found to have tachycardia, tachypnea, sodium 131, renal function okay, normal lactate,negative troponin, WBC 7.6, temperature normal. CT head is negative for acute abnormalities. Chest x-ray showed hypoventilation. CTA of chest showed no acute pulmonary embolism, but has bilateral lower lobe opacity and volume loss. This may represent pneumonia, aspiration, atelectasis.  Assessment/Plan:   Principal problem:  Sepsis secondary to HCAP versus UTI - Met criteria for sepsis on admission. Source likely urinary versus pneumonia. - Blood cultures negative to date. Urine cultures grew multiple species of bacteria. - Status post fluid volume resuscitation and 4 days of IV antibiotics which have now been discontinued. - Continue comfort care.  Active problems:  Microcytic anemia, multifactorial - Due to recent chemotherapy, malnutrition, dilution, infection, antibiotics.  - No transfusion is indicated at this time. - No further lab draws.  Acute encephalopathy with paranoid behavior / delirium - Likely secondary to sepsis vs hyponatremia.  - Continue depakote. - Mental status much improved today.  Cancer of base of tongue ([place]) - Dr. Alvy Bimler consulting. - s/p concurrent cetuximab and radiation therapy, tolerated it poorly. No further treatment. - S/P gastrostomy tube (G tube) placement. - Start clear liquids for comfort purposes.  Protein calorie malnutrition ([place]) - Tube feeds PRN only  hunger or patient request.  GERD - Carafate PRN.  HTN - Not on meds at home.    DVT Prophylaxis - D/C Lovenox in light of comfort measures.   Family Communication/Anticipated D/C date and plan/Code Status   Family Communication:  [name] (daughter) updated at the bedside. Disposition Plan: Likely will need SNF versus ALF versus residential hospice. Anticipated D/C date:   02/03/15, watch over weekend to determine appropriate disposition, given move toward comfort care. Code Status: DNR.   IV Access:    Port-A-Cath right chest   Procedures and diagnostic studies:   Ct Head Wo Contrast  01/28/2015  CLINICAL DATA:  79 year old male with altered mental status. History of tongue cancer. EXAM: CT HEAD WITHOUT CONTRAST TECHNIQUE: Contiguous axial images were obtained from the base of the skull through the vertex without intravenous contrast. COMPARISON:  Head CT dated 09/30/2014 FINDINGS: The ventricles are dilated and the sulci are prominent compatible with age-related atrophy. Periventricular and deep white matter hypodensities represent chronic microvascular ischemic changes. There is no intracranial hemorrhage. No mass effect or midline shift identified. The visualized paranasal sinuses are well aerated. There is minimal mucoperiosteal thickening of the mastoid air cells. Multiple calvarial lucencies are stable compared to the prior study and of indeterminate clinical significance. IMPRESSION: No acute intracranial pathology. Age-related atrophy and chronic microvascular ischemic disease. If symptoms persist and there are no contraindications, MRI may provide better evaluation if clinically indicated. Electronically Signed   By: Anner Crete M.D.   On: 01/28/2015 20:50   Ct Angio Chest Pe W/cm &/or Wo Cm  01/29/2015  CLINICAL DATA:  Tachycardia. EXAM: CT ANGIOGRAPHY CHEST WITH CONTRAST TECHNIQUE: Multidetector CT imaging of the chest was performed using the standard protocol during  bolus administration of intravenous contrast.  Multiplanar CT image reconstructions and MIPs were obtained to evaluate the vascular anatomy. CONTRAST:  100 mL Omnipaque 350 intravenous COMPARISON:  Radiographs 01/28/2015 FINDINGS: Cardiovascular: There is good opacification of the pulmonary arteries. There is no pulmonary embolism. The thoracic aorta is normal in caliber and intact. Lungs: Patchy and linear opacities in both lower lobes, with associated volume loss. This could represent multifocal pneumonia. Atelectasis is also possibility. Aspiration not excluded. Central airways: Patent Effusions: None Lymphadenopathy: None Esophagus: Unremarkable Upper abdomen: Unremarkable Musculoskeletal: No significant abnormality. Moderately severe degenerative disc changes are present, greatest in the mid thoracic spine. Review of the MIP images confirms the above findings. IMPRESSION: 1. Negative for acute pulmonary embolism. 2. Bilateral lower lobe opacity and volume loss. This may represent pneumonia, aspiration, atelectasis. Electronically Signed   By: Andreas Newport M.D.   On: 01/29/2015 01:44   Dg Chest Port 1 View  01/28/2015  CLINICAL DATA:  Cancer base of tongue.  Short of breath. EXAM: PORTABLE CHEST 1 VIEW COMPARISON:  12/30/2014 FINDINGS: Hypoventilation with decreased lung volume. Increase in bibasilar atelectasis. Both negative for heart failure or pneumonia or effusion Right jugular Port-A-Cath tip in the SVC Moderate to advanced degenerative change in both shoulder joints. IMPRESSION: Hypoventilation with bibasilar atelectasis. Electronically Signed   By: Franchot Gallo M.D.   On: 01/28/2015 16:22     Medical Consultants:    Oncology: Heath Lark, MD  Palliative Care: Knox Royalty, NP  Anti-Infectives:    Rocephin 01/28/15---> 01/29/15  Vancomycin 01/29/15--->01/31/15  Zosyn 01/29/15--->01/31/15    Subjective:   Kenneth Jordan is much more awake and appropriate today. He says he  feels hungry at times but really just wants a "coke". Still a bit irritable. Denies pain and dyspnea. Wants to go home.  Objective:    Filed Vitals:   02/01/15 0600 02/01/15 1500 02/01/15 2104 02/02/15 0507  BP: 160/82 148/76 155/82 149/87  Pulse: 97 96 94 92  Temp: 98 F (36.7 C) 98.6 F (37 C) 98.7 F (37.1 C) 98.7 F (37.1 C)  TempSrc: Oral Oral Oral Oral  Resp: _0 Weight:      SpO2: 99% 99% 98% 98%   No intake or output data in the 24 hours ending 02/02/15 0836 Filed Weights   01/29/15 0220 01/31/15 0257 01/31/15 0301  Weight: 75.116 kg (165 lb 9.6 oz) 75.1 kg (165 lb 9.1 oz) 75.1 kg (165 lb 9.1 oz)    Exam: Gen:  Awake and alert. Cardiovascular:  RRR, No M/R/G Respiratory:  Lungs CTAB Gastrointestinal:  Abdomen soft, NT/ND, + BS Extremities:  No C/E/C   Data Reviewed:    Labs: Basic Metabolic Panel:  Recent Labs Lab 01/28/15 1544 01/29/15 0430 01/30/15 0454  NA 131* 135 136  K 4.8 4.3 4.1  CL 97* 103 103  CO2 _1 GLUCOSE 104* 95 86  BUN 31* 24* 15  CREATININE 0.90 0.73 0.89  CALCIUM 9.0 8.2* 8.6*   GFR Estimated Creatinine Clearance: 69.5 mL/min (by C-G formula based on Cr of 0.89). Liver Function Tests:  Recent Labs Lab 01/29/15 0430  AST 13*  ALT 9*  ALKPHOS 47  BILITOT 0.3  PROT 5.9*  ALBUMIN 2.6*   Coagulation profile  Recent Labs Lab 01/29/15 0019  INR 1.07    CBC:  Recent Labs Lab 01/28/15 1544 01/29/15 0430 01/30/15 0454  WBC 7.6 6.8 5.8  HGB 10.4* 9.2* 9.4*  HCT 33.9* 30.5* 30.5*  MCV 80.0 78.8 79.4  PLT 384 296 348   CBG:  Recent Labs Lab 01/30/15 2134 01/30/15 2344 01/31/15 0422 01/31/15 0727 01/31/15 1156  GLUCAP 99 127* 84 151* 76   Sepsis Labs:  Recent Labs Lab 01/28/15 1544 01/28/15 1613 01/28/15 1919 01/29/15 0019 01/29/15 0430 01/30/15 0454  PROCALCITON  --   --   --  <0.10  --   --   WBC 7.6  --   --   --  6.8 5.8  LATICACIDVEN  --  1.01 0.94 1.1 0.6  --     Microbiology Recent Results (from the past 240 hour(s))  Blood Culture (routine x 2)     Status: None (Preliminary result)   Collection Time: 01/28/15  4:00 PM  Result Value Ref Range Status   Specimen Description BLOOD RIGHT CHEST PORTA CATH  Final   Special Requests BOTTLES DRAWN AEROBIC AND ANAEROBIC 5.5ML  Final   Culture   Final    NO GROWTH 4 DAYS Performed at [facility]    Report Status PENDING  Incomplete  Blood Culture (routine x 2)     Status: None (Preliminary result)   Collection Time: 01/28/15  4:54 PM  Result Value Ref Range Status   Specimen Description BLOOD RIGHT ARM  Final   Special Requests BOTTLES DRAWN AEROBIC AND ANAEROBIC 5ML  Final   Culture   Final    NO GROWTH 4 DAYS Performed at [facility]    Report Status PENDING  Incomplete  Urine culture     Status: None   Collection Time: 01/28/15 10:20 PM  Result Value Ref Range Status   Specimen Description URINE, CATHETERIZED  Final   Special Requests NONE  Final   Culture   Final    MULTIPLE SPECIES PRESENT, SUGGEST RECOLLECTION Performed at [facility]    Report Status 01/30/2015 FINAL  Final     Medications:   . clindamycin  1 application Topical BID  . hydrocortisone  1 application Topical BID  . MUSCLE RUB  1 application Topical Daily  . sodium chloride  3 mL Intravenous Q12H  . valproic acid  250 mg Oral BID   Continuous Infusions:    Time spent: 25 minutes .  LOS: 4 days     [place] [facility] Pager [phone]. If unable to reach me by pager, please call my cell phone at [phone].  *Please refer to [email or web address], password [email or web address] to get updated schedule on who will round on this patient, as hospitalists switch teams weekly. If 7PM-7AM, please contact night-coverage at [email or web address], password [email or web address] for any overnight needs.  02/02/2015, 8:36 AM

## 2015-02-03 ENCOUNTER — Other Ambulatory Visit: Payer: Self-pay | Admitting: Hematology and Oncology

## 2015-02-03 ENCOUNTER — Encounter: Payer: Self-pay | Admitting: *Deleted

## 2015-02-03 DIAGNOSIS — R1310 Dysphagia, unspecified: Secondary | ICD-10-CM

## 2015-02-03 MED ORDER — OSMOLITE 1.2 CAL PO LIQD: 237.0000 mL | ORAL | Status: AC | PRN

## 2015-02-03 MED ORDER — HEPARIN SOD (PORK) LOCK FLUSH 100 UNIT/ML IV SOLN
500.0000 [IU] | INTRAVENOUS | Status: AC | PRN
  Administered 2015-02-03: 500 [IU]

## 2015-02-03 MED ORDER — VALPROATE SODIUM 250 MG/5ML PO SYRP: 250.0000 mg | ORAL_SOLUTION | Freq: Two times a day (BID) | ORAL | Status: DC

## 2015-02-03 MED ORDER — ALBUTEROL SULFATE (2.5 MG/3ML) 0.083% IN NEBU: 2.5000 mg | INHALATION_SOLUTION | RESPIRATORY_TRACT | Status: DC | PRN

## 2015-02-03 NOTE — Progress Notes (Signed)
  Oncology Nurse Navigator Documentation   Navigator Encounter Type: Other (02/03/15 1110) Patient Visit Type: Inpatient (02/03/15 1110)     To provide support and encouragement, care continuity and to assess for needs, visited patient [name].  His dtr Kenneth Jordan was at the bedside.  Kenneth Jordan was intermittently awake and appropriate, otherwise slept.   Kenneth Jordan acknowledged hospice involvement with his care, shared her struggle re continuation or cessation of PEG nutritional supplementation.  She understands that SW is looking for SNF placement. I will continue to follow during this admission.  [name], RN, BSN, [place] [facility] [place] [phone]                    Time Spent with Patient: 30 (02/03/15 1110)

## 2015-02-03 NOTE — Progress Notes (Signed)
Kenneth Jordan   DOB:September 17, 1935   [id number]    Subjective: The patient made no progress over the weekend. He is semi-responsive, would open his eyes intermittently and then he would close his eyes.  Objective:  Filed Vitals:   02/02/15 2136 02/03/15 0520  BP: 140/74 167/93  Pulse: 82 82  Temp: 98.5 F (36.9 C) 98.3 F (36.8 C)  Resp: 20 19    No intake or output data in the 24 hours ending 02/03/15 0850  GENERAL: The patient appears comfortable. Would open his eyes intermittently. NEURO:Unable to assess.    Labs:  Lab Results  Component Value Date   WBC 5.8 01/30/2015   HGB 9.4* 01/30/2015   HCT 30.5* 01/30/2015   MCV 79.4 01/30/2015   PLT 348 01/30/2015   NEUTROABS 6.0 01/13/2015    Lab Results  Component Value Date   NA 136 01/30/2015   K 4.1 01/30/2015   CL 103 01/30/2015   CO2 26 01/30/2015   Assessment & Plan:  Cancer of base of tongue ([place])  Sepsis, likely pneumonia  Acute encephalopathy, metabolic in nature Squamous cell carcinoma of the base of the tongue s/p concurrent cetuximab and radiation therapy, tolerating it poorly He is very weak and not interactive. I agree with transition to comfort care  Protein calorie malnutrition ([place]) I agree to discontinue tube feeds   Code status DNR  Discharge planning Since there is no clinical improvement is observed, consider transition to hospice/comfort care Other medical issues as per admitting team  I will sign off.   [place], [place], MD 02/03/2015  8:50 AM

## 2015-02-03 NOTE — Progress Notes (Signed)
Daily Progress Note   Patient Name: Kenneth Jordan       Date: 02/03/2015 DOB: [birth year]  Age: 79 y.o. MRN#: [id number] Attending Physician: Venetia Maxon Rama, MD Primary Care Physician: Pcp Not In System Admit Date: 01/28/2015  Reason for Consultation/Follow-up: Establishing goals of care and Psychosocial/spiritual support  Subjective:  -plan of care is to focus on comfort, quality and dignity, no life prolonging measures per discussion with Dr Rockne Menghini, myself and Kellie/daughter on Friday morning  -hope is for a hospice facility.  Patient's desire is to continue to a take POs understanding  severe risk of aspiration,  and high risk of continued upper respiratory infection, patietn is at high risk for decompensation.  I believe a hospice facility will benefit this patient.  Antibiotics have been stopped.     Length of Stay: 5 days  Current Medications: Scheduled Meds:  . clindamycin  1 application Topical BID  . hydrocortisone  1 application Topical BID  . MUSCLE RUB  1 application Topical Daily  . sodium chloride  3 mL Intravenous Q12H  . valproic acid  250 mg Oral BID    Continuous Infusions:    PRN Meds: acetaminophen, albuterol, feeding supplement (OSMOLITE 1.2 CAL), lidocaine-prilocaine, loratadine, morphine, ondansetron **OR** ondansetron (ZOFRAN) IV, sodium chloride, sucralfate  Physical Exam:  Physical Exam  Constitutional: He appears well-developed.  HENT:  Head: Normocephalic.  -swollen tongue, audible threat secretions, unable to handle secretions   Cardiovascular: Normal rate, regular rhythm and normal heart sounds.   Pulmonary/Chest: Effort normal and breath sounds normal.  Abdominal: Soft. Bowel sounds are normal.  Neurological: He is alert.  intermittently  confused  Skin: Skin is warm and dry.                Vital Signs: BP 167/93 mmHg  Pulse 82  Temp(Src) 98.3 F (36.8 C) (Oral)  Resp 19  Wt 75.1 kg (165 lb 9.1 oz)  SpO2 97% SpO2: SpO2: 97 % O2 Device: O2 Device: Not Delivered O2 Flow Rate:    Intake/output summary: No intake or output data in the 24 hours ending 02/03/15 0851 LBM: Last BM Date: 02/02/15 Baseline Weight: Weight: 77.111 kg (170 lb) Most recent weight: Weight: 75.1 kg (165 lb 9.1 oz)       Palliative Assessment/Data: Flowsheet Rows  Most Recent Value   Intake Tab    Referral Department  Hospitalist   Unit at Time of Referral  Med/Surg Unit   Palliative Care Primary Diagnosis  Cancer   Date Notified  01/29/15   Palliative Care Type  New Palliative care   Reason for referral  Clarify Goals of Care, Counsel Regarding Hospice, Psychosocial or Spiritual support, Advance Care Planning   Date of Admission  01/28/15   Date first seen by Palliative Care  01/29/15   # of days Palliative referral response time  0 Day(s)   # of days IP prior to Palliative referral  1   Clinical Assessment    Palliative Performance Scale Score  30%   Psychosocial & Spiritual Assessment    Palliative Care Outcomes       Additional Data Reviewed: CBC    Component Value Date/Time   WBC 5.8 01/30/2015 0454   WBC 9.2 01/13/2015 1558   RBC 3.84* 01/30/2015 0454   RBC 4.44 01/13/2015 1558   HGB 9.4* 01/30/2015 0454   HGB 10.7* 01/13/2015 1558   HCT 30.5* 01/30/2015 0454   HCT 34.2* 01/13/2015 1558   PLT 348 01/30/2015 0454   PLT 495* 01/13/2015 1558   MCV 79.4 01/30/2015 0454   MCV 77.0* 01/13/2015 1558   MCH 24.5* 01/30/2015 0454   MCH 24.1* 01/13/2015 1558   MCHC 30.8 01/30/2015 0454   MCHC 31.2* 01/13/2015 1558   RDW 15.8* 01/30/2015 0454   RDW 15.7* 01/13/2015 1558   LYMPHSABS 1.6 01/13/2015 1558   LYMPHSABS 0.7 12/30/2014 1237   MONOABS 1.2* 01/13/2015 1558   MONOABS 0.9 12/30/2014 1237   EOSABS 0.4 01/13/2015  1558   EOSABS 0.0 12/30/2014 1237   BASOSABS 0.1 01/13/2015 1558   BASOSABS 0.0 12/30/2014 1237    CMP     Component Value Date/Time   NA 136 01/30/2015 0454   NA 139 01/13/2015 1557   K 4.1 01/30/2015 0454   K 4.9 01/13/2015 1557   CL 103 01/30/2015 0454   CO2 26 01/30/2015 0454   CO2 24 01/13/2015 1557   GLUCOSE 86 01/30/2015 0454   GLUCOSE 75 01/13/2015 1557   BUN 15 01/30/2015 0454   BUN 30.0* 01/13/2015 1557   CREATININE 0.89 01/30/2015 0454   CREATININE 0.8 01/13/2015 1557   CALCIUM 8.6* 01/30/2015 0454   CALCIUM 9.5 01/13/2015 1557   PROT 5.9* 01/29/2015 0430   PROT 6.6 01/13/2015 1557   ALBUMIN 2.6* 01/29/2015 0430   ALBUMIN 2.6* 01/13/2015 1557   AST 13* 01/29/2015 0430   AST 23 01/13/2015 1557   ALT 9* 01/29/2015 0430   ALT 16 01/13/2015 1557   ALKPHOS 47 01/29/2015 0430   ALKPHOS 71 01/13/2015 1557   BILITOT 0.3 01/29/2015 0430   BILITOT <0.30 01/13/2015 1557   GFRNONAA >60 01/30/2015 0454   GFRAA >60 01/30/2015 0454       Problem List:  Patient Active Problem List   Diagnosis Date Noted  . HCAP (healthcare-associated pneumonia) 01/29/2015  . Palliative care encounter 01/29/2015  . DNR (do not resuscitate) discussion 01/29/2015  . Hypertension   . Altered mental status   . Tachycardia 01/28/2015  . Heartburn symptom 01/15/2015  . Nasal congestion with rhinorrhea 01/07/2015  . Depression, major, recurrent, mild ([place]) 01/07/2015  . UTI (urinary tract infection) 01/02/2015  . Fever in adult 12/30/2014  . Oral thrush 12/30/2014  . Acute encephalopathy 12/30/2014  . Sepsis ([place]) 12/30/2014  . Microcytic anemia 12/30/2014  . Protein calorie  malnutrition ([place]) 12/25/2014  . Insomnia 12/24/2014  . 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 Rash, skin 12/10/2014  . Throat pain in adult 12/10/2014  . Hypersensitivity reaction 12/04/2014  . S/P gastrostomy tube (G tube) placement, follow-up exam 12/02/2014  . Drug-induced hypotension 12/02/2014  .  Constipation due to opioid therapy 12/02/2014  . Cancer of base of tongue ([place]) 11/08/2014  . Weight loss 11/08/2014  . Hemoptysis 11/08/2014  . Dysphagia 11/08/2014     Palliative Care Assessment & Plan    1.Code Status:  DNR      Code Status Orders        Start     Ordered   01/29/15 1114  Do not attempt resuscitation (DNR)   Continuous    Question Answer Comment  In the event of cardiac or respiratory ARREST Do not call a "code blue"   In the event of cardiac or respiratory ARREST Do not perform Intubation, CPR, defibrillation or ACLS   In the event of cardiac or respiratory ARREST Use medication by any route, position, wound care, and other measures to relive pain and suffering. May use oxygen, suction and manual treatment of airway obstruction as needed for comfort.      01/29/15 1113      Additional Recommendations:  Psycho-social Needs: Education on Hospice and Grief/Bereavement Support    Palliative Prophylaxis:   Aspiration, Bowel Regimen, Delirium Protocol, Frequent Pain Assessment and Oral Care  Prognosis: Pending outcomes , reassess  on Monday morning   Discharge Planning:   Referral to hospice facility to evaluate for [place] was discussed with Dr Rockne Menghini  Thank you for allowing the Palliative Medicine Team to assist in the care of this patient.   Time In: 0800 Time Out: 0835 Total Time 35 min Prolonged Time Billed  no         Knox Royalty, NP  02/03/2015, 8:51 AM  Please contact Palliative Medicine Team phone at [phone] for questions and concerns.

## 2015-02-03 NOTE — Progress Notes (Signed)
PT Cancellation Note  Patient Details Name: Kenneth Jordan MRN: [id number] DOB: 1935/09/10   Cancelled Treatment:     attempted twice today                                            Am pt requested to rest and for [place] to "come back later".                                            Pm soundly sleeping.  RN stated "let him sleep"      [name] 02/03/2015, 3:58 PM

## 2015-02-03 NOTE — Progress Notes (Signed)
Per previous note from Wadie Lessen, patient is to be reassessed by palliative on today 12/5. CSW will follow up regarding appropriate plan for disposition after assessment is complete.   Lucius Conn, [place] Social Worker [place] [phone]

## 2015-02-03 NOTE — NC FL2 (Signed)
[place] LEVEL OF CARE SCREENING TOOL     IDENTIFICATION  Patient Name: Kenneth Jordan Birthdate: Jan 21, 1936 Sex: male Admission Date (Current Location): 01/28/2015  [facility] and [place] Number: Company secretary and Address:  [facility],  [place] [address], [place]      Provider Number: [id number]  Attending Physician Name and Address:  Venetia Maxon Rama, MD  Relative Name and Phone Number:       Current Level of Care: Hospital Recommended Level of Care: [place] Prior Approval Number:    Date Approved/Denied: 01/02/15 PASRR Number: [id number]  Discharge Plan: SNF    Current Diagnoses: Patient Active Problem List   Diagnosis Date Noted  . HCAP (healthcare-associated pneumonia) 01/29/2015  . Palliative care encounter 01/29/2015  . DNR (do not resuscitate) discussion 01/29/2015  . Hypertension   . Altered mental status   . Tachycardia 01/28/2015  . Heartburn symptom 01/15/2015  . Nasal congestion with rhinorrhea 01/07/2015  . Depression, major, recurrent, mild ([place]) 01/07/2015  . UTI (urinary tract infection) 01/02/2015  . Fever in adult 12/30/2014  . Oral thrush 12/30/2014  . Acute encephalopathy 12/30/2014  . Sepsis ([place]) 12/30/2014  . Microcytic anemia 12/30/2014  . Protein calorie malnutrition ([place]) 12/25/2014  . Insomnia 12/24/2014  . 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 Rash, skin 12/10/2014  . Throat pain in adult 12/10/2014  . Hypersensitivity reaction 12/04/2014  . S/P gastrostomy tube (G tube) placement, follow-up exam 12/02/2014  . Drug-induced hypotension 12/02/2014  . Constipation due to opioid therapy 12/02/2014  . Cancer of base of tongue ([place]) 11/08/2014  . Weight loss 11/08/2014  . Hemoptysis 11/08/2014  . Dysphagia 11/08/2014    Orientation ACTIVITIES/SOCIAL BLADDER RESPIRATION    Self, Time, Place  Active Continent Normal  BEHAVIORAL SYMPTOMS/MOOD NEUROLOGICAL BOWEL NUTRITION  STATUS      Continent Feeding tube  PHYSICIAN VISITS COMMUNICATION OF NEEDS Height & Weight Skin    Verbally 5\' 9"  (175.3 cm) 165 lbs. Normal          AMBULATORY STATUS RESPIRATION    Assist extensive Normal      Personal Care Assistance Level of Assistance  Bathing, Feeding, Dressing Bathing Assistance: Maximum assistance Feeding assistance: Limited assistance Dressing Assistance: Maximum assistance      Functional Limitations Info  Sight, Hearing, Speech Sight Info: Impaired Hearing Info: Impaired Speech Info: Adequate       SPECIAL CARE FACTORS FREQUENCY  PT (By licensed PT), OT (By licensed OT)     PT Frequency: 5xs a week OT Frequency: 3s a week           Additional Factors Info  Code Status, Allergies Code Status Info: DNR Allergies Info: Dilaudid, other            Current Medications (02/03/2015):  This is the current hospital active medication list Current Facility-Administered Medications  Medication Dose Route Frequency Provider Last Rate Last Dose  . acetaminophen (TYLENOL) tablet 650 mg  650 mg Oral Q6H PRN Ivor Costa, MD      . albuterol (PROVENTIL) (2.5 MG/3ML) 0.083% nebulizer solution 2.5 mg  2.5 mg Nebulization Q4H PRN Ivor Costa, MD      . clindamycin (CLINDAGEL) 1 % gel 1 application  1 application Topical BID Ivor Costa, MD   1 application at [id number] [phone]  . feeding supplement (OSMOLITE 1.2 CAL) liquid 237 mL  237 mL Per Tube Q4H PRN Venetia Maxon Rama, MD      .  hydrocortisone 1 % ointment 1 application  1 application Topical BID Ivor Costa, MD   1 application at [id number] [phone]  . lidocaine-prilocaine (EMLA) cream 1 application  1 application Topical PRN Ivor Costa, MD      . loratadine (CLARITIN) tablet 10 mg  10 mg Oral Daily PRN Ivor Costa, MD      . morphine 10 MG/5ML solution 10 mg  10 mg Per Tube Q2H PRN Ivor Costa, MD      . MUSCLE RUB CREA 1 application  1 application Topical Daily Ivor Costa, MD   1 application at [id number] [phone]  .  ondansetron (ZOFRAN) tablet 4 mg  4 mg Oral Q6H PRN Ivor Costa, MD       Or  . ondansetron [facility]) injection 4 mg  4 mg Intravenous Q6H PRN Ivor Costa, MD      . sodium chloride 0.9 % injection 10-40 mL  10-40 mL Intracatheter PRN Venetia Maxon Rama, MD   10 mL at 02/02/15 1757  . sodium chloride 0.9 % injection 3 mL  3 mL Intravenous Q12H Ivor Costa, MD   3 mL at 02/03/15 0931  . sucralfate (CARAFATE) 1 GM/10ML suspension 1 g  1 g Oral QID PRN Ivor Costa, MD      . valproic acid (DEPAKENE) 250 MG/5ML syrup 250 mg  250 mg Oral BID Knox Royalty, NP   250 mg at 02/03/15 0930     Discharge Medications: Please see discharge summary for a list of discharge medications.  Relevant Imaging Results:  Relevant Lab Results:  Recent Labs    Additional Information    Raymondo Band, LCSW

## 2015-02-03 NOTE — Clinical Social Work Placement (Signed)
   CLINICAL SOCIAL WORK PLACEMENT  NOTE  Date:  02/03/2015  Patient Details  Name: Kenneth Jordan MRN: [id number] Date of Birth: 05-13-1935  Clinical Social Work is seeking post-discharge placement for this patient at the [place] level of care (*CSW will initial, date and re-position this form in  chart as items are completed):  Yes   Patient/family provided with [place] Work Department's list of facilities offering this level of care within the geographic area requested by the patient (or if unable, by the patient's family).  Yes   Patient/family informed of their freedom to choose among providers that offer the needed level of care, that participate in Medicare, Medicaid or managed care program needed by the patient, have an available bed and are willing to accept the patient.      Patient/family informed of [facility]'s ownership interest in [facility] and [facility], as well as of the fact that they are under no obligation to receive care at these facilities.  PASRR submitted to EDS on       PASRR number received on 02/03/15     Existing PASRR number confirmed on 01/02/15     FL2 transmitted to all facilities in geographic area requested by pt/family on 02/03/15     FL2 transmitted to all facilities within larger geographic area on 02/03/15     Patient informed that his/her managed care company has contracts with or will negotiate with certain facilities, including the following:        Yes   Patient/family informed of bed offers received.  Patient chooses bed at  [facility])     Physician recommends and patient chooses bed at      Patient to be transferred to  [facility]) on 02/03/15.  Patient to be transferred to facility by  [facility])     Patient family notified on 02/03/15 of transfer.  Name of family member notified:  Daughters [name] and Mickel Baas     PHYSICIAN       Additional Comment:     _______________________________________________ Raymondo Band, LCSW 02/03/2015, 5:27 PM

## 2015-02-03 NOTE — Progress Notes (Signed)
CSW contacted physical therapy to request for patient to be prioritized for disposition. PT stated they will not be able to evaluate until tomorrow morning. Will await PT follow up.   Lucius Conn, [place] Social Worker [place] [phone]

## 2015-02-03 NOTE — Progress Notes (Signed)
Spoke with pt's daughter [name] [phone] concerning pt's disposition. She selected SNF, CSW will follow.

## 2015-02-03 NOTE — Progress Notes (Signed)
SLP Cancellation Note  Patient Details Name: Kenneth Jordan MRN: [id number] DOB: 1935/07/13   Cancelled treatment:       Reason Eval/Treat Not Completed: SLP screened, no needs identified, will sign off. Pt is consuming diet with goal of comfort. Was evaluated by SLP one month ago with findings of signs of aspiration. Pt verbalized wish to consume PO with known risk of aspiration and aspiration precautions were provided. Discussed with MD. No SLP needs at this time, will sign off.    Mailyn Steichen, [place] 02/03/2015, 7:49 AM

## 2015-02-03 NOTE — Discharge Summary (Signed)
Physician Discharge Summary  Kenneth Jordan [id number] DOB: December 22, 1935 DOA: 01/28/2015  PCP: Pcp Not In System  Admit date: 01/28/2015 Discharge date: 02/03/2015   Recommendations for Outpatient Follow-Up:   1. The patient is being discharged to a SNF.  No further treatment recommendations for malignancy.  Comfort care recommended.   Discharge Diagnosis:   Principal Problem:    Acute encephalopathy in the setting of sepsis secondary to HCAP versus UTI Active Problems:    Cancer of base of tongue (HCC)    S/P gastrostomy tube (G tube) placement, follow-up exam    Protein calorie malnutrition (HCC)    Sepsis ([place])    UTI (urinary tract infection)    Depression, major, recurrent, mild (HCC)    Tachycardia    Hypertension    HCAP (healthcare-associated pneumonia)    Palliative care encounter    DNR (do not resuscitate) discussion    Altered mental status   Discharge disposition:  SNF  Discharge Condition: Stable but terminal.  Diet recommendation: Comfort feeds.   History of Present Illness:   Kenneth Jordan is an 79 y.o. male with PMH of hypertension, GERD, depression, anxiety, arthritis, cancer of base of tongue (diagnosed in August of this year, currently undergoing chemotherapy as well as radiation treatments, last radiation last week), s/p of G-tube, who was admitted 01/28/15 with altered mental status and worsening generalized weakness. In ED, patient was found to have tachycardia, tachypnea, sodium 131, renal function okay, normal lactate,negative troponin, WBC 7.6, temperature normal. CT head is negative for acute abnormalities. Chest x-ray showed hypoventilation. CTA of chest showed no acute pulmonary embolism, but has bilateral lower lobe opacity and volume loss. This may represent pneumonia, aspiration, atelectasis.   Hospital Course by Problem:   Principal problem:  Sepsis secondary to HCAP versus UTI - Met criteria for sepsis on admission.  Source likely urinary versus pneumonia. - Blood cultures negative to date. Urine cultures grew multiple species of bacteria. - Status post fluid volume resuscitation and 4 days of IV antibiotics which have now been discontinued. - Continue comfort care.  Active problems:  Microcytic anemia, multifactorial - Due to recent chemotherapy, malnutrition, dilution, infection, antibiotics.  - No transfusion is indicated at this time. - No further lab draws.  Acute encephalopathy with paranoid behavior / delirium - Likely secondary to sepsis vs hyponatremia.  - Continue depakote.  Cancer of base of tongue ([place]) - Dr. Alvy Jordan consulting. - s/p concurrent cetuximab and radiation therapy, tolerated it poorly. No further treatment. - S/P gastrostomy tube (G tube) placement. - Diet as tolerated, comfort feeds as recommended by speech therapy during prior evaluation.  Protein calorie malnutrition ([place]) - Tube feeds PRN only hunger or patient request.  GERD - Carafate PRN.  HTN - Not on meds at home.    Medical Consultants:    Oncology: Kenneth Lark, MD  Palliative Care: Kenneth Royalty, NP   Discharge Exam:   Filed Vitals:   02/03/15 0520 02/03/15 1440  BP: 167/93 153/80  Pulse: 82 99  Temp: 98.3 F (36.8 C) 98.9 F (37.2 C)  Resp: 19 17   Filed Vitals:   02/02/15 1529 02/02/15 2136 02/03/15 0520 02/03/15 1440  BP: 140/82 140/74 167/93 153/80  Pulse: 86 82 82 99  Temp: 98.5 F (36.9 C) 98.5 F (36.9 C) 98.3 F (36.8 C) 98.9 F (37.2 C)  TempSrc: Oral Oral Oral Oral  Resp: 18 20 19 17   Weight:      SpO2: 98% 99%  97% 98%    Gen:  NAD Cardiovascular:  RRR, No M/R/G Respiratory: Lungs CTAB Gastrointestinal: Abdomen soft, NT/ND with normal active bowel sounds. Extremities: No C/E/C   The results of significant diagnostics from this hospitalization (including imaging, microbiology, ancillary and laboratory) are listed below for reference.     Procedures and  Diagnostic Studies:   Ct Head Wo Contrast  01/28/2015  CLINICAL DATA:  79 year old male with altered mental status. History of tongue cancer. EXAM: CT HEAD WITHOUT CONTRAST TECHNIQUE: Contiguous axial images were obtained from the base of the skull through the vertex without intravenous contrast. COMPARISON:  Head CT dated 09/30/2014 FINDINGS: The ventricles are dilated and the sulci are prominent compatible with age-related atrophy. Periventricular and deep white matter hypodensities represent chronic microvascular ischemic changes. There is no intracranial hemorrhage. No mass effect or midline shift identified. The visualized paranasal sinuses are well aerated. There is minimal mucoperiosteal thickening of the mastoid air cells. Multiple calvarial lucencies are stable compared to the prior study and of indeterminate clinical significance. IMPRESSION: No acute intracranial pathology. Age-related atrophy and chronic microvascular ischemic disease. If symptoms persist and there are no contraindications, MRI may provide better evaluation if clinically indicated. Electronically Signed   By: Anner Crete M.D.   On: 01/28/2015 20:50   Ct Angio Chest Pe W/cm &/or Wo Cm  01/29/2015  CLINICAL DATA:  Tachycardia. EXAM: CT ANGIOGRAPHY CHEST WITH CONTRAST TECHNIQUE: Multidetector CT imaging of the chest was performed using the standard protocol during bolus administration of intravenous contrast. Multiplanar CT image reconstructions and MIPs were obtained to evaluate the vascular anatomy. CONTRAST:  100 mL Omnipaque 350 intravenous COMPARISON:  Radiographs 01/28/2015 FINDINGS: Cardiovascular: There is good opacification of the pulmonary arteries. There is no pulmonary embolism. The thoracic aorta is normal in caliber and intact. Lungs: Patchy and linear opacities in both lower lobes, with associated volume loss. This could represent multifocal pneumonia. Atelectasis is also possibility. Aspiration not excluded.  Central airways: Patent Effusions: None Lymphadenopathy: None Esophagus: Unremarkable Upper abdomen: Unremarkable Musculoskeletal: No significant abnormality. Moderately severe degenerative disc changes are present, greatest in the mid thoracic spine. Review of the MIP images confirms the above findings. IMPRESSION: 1. Negative for acute pulmonary embolism. 2. Bilateral lower lobe opacity and volume loss. This may represent pneumonia, aspiration, atelectasis. Electronically Signed   By: Andreas Newport M.D.   On: 01/29/2015 01:44   Dg Chest Port 1 View  01/28/2015  CLINICAL DATA:  Cancer base of tongue.  Short of breath. EXAM: PORTABLE CHEST 1 VIEW COMPARISON:  12/30/2014 FINDINGS: Hypoventilation with decreased lung volume. Increase in bibasilar atelectasis. Both negative for heart failure or pneumonia or effusion Right jugular Port-A-Cath tip in the SVC Moderate to advanced degenerative change in both shoulder joints. IMPRESSION: Hypoventilation with bibasilar atelectasis. Electronically Signed   By: Franchot Gallo M.D.   On: 01/28/2015 16:22     Labs:   Basic Metabolic Panel:  Recent Labs Lab 01/28/15 1544 01/29/15 0430 01/30/15 0454  NA 131* 135 136  K 4.8 4.3 4.1  CL 97* 103 103  CO2 27 25 26   GLUCOSE 104* 95 86  BUN 31* 24* 15  CREATININE 0.90 0.73 0.89  CALCIUM 9.0 8.2* 8.6*   GFR Estimated Creatinine Clearance: 69.5 mL/min (by C-G formula based on Cr of 0.89). Liver Function Tests:  Recent Labs Lab 01/29/15 0430  AST 13*  ALT 9*  ALKPHOS 47  BILITOT 0.3  PROT 5.9*  ALBUMIN 2.6*   Coagulation profile  Recent Labs Lab 01/29/15 0019  INR 1.07    CBC:  Recent Labs Lab 01/28/15 1544 01/29/15 0430 01/30/15 0454  WBC 7.6 6.8 5.8  HGB 10.4* 9.2* 9.4*  HCT 33.9* 30.5* 30.5*  MCV 80.0 78.8 79.4  PLT 384 296 348   CBG:  Recent Labs Lab 01/30/15 2134 01/30/15 2344 01/31/15 0422 01/31/15 0727 01/31/15 1156  GLUCAP 99 127* 84 151* 76    Microbiology Recent Results (from the past 240 hour(s))  Blood Culture (routine x 2)     Status: None   Collection Time: 01/28/15  4:00 PM  Result Value Ref Range Status   Specimen Description BLOOD RIGHT CHEST PORTA CATH  Final   Special Requests BOTTLES DRAWN AEROBIC AND ANAEROBIC 5.5ML  Final   Culture   Final    NO GROWTH 5 DAYS Performed at [facility]    Report Status 02/02/2015 FINAL  Final  Blood Culture (routine x 2)     Status: None   Collection Time: 01/28/15  4:54 PM  Result Value Ref Range Status   Specimen Description BLOOD RIGHT ARM  Final   Special Requests BOTTLES DRAWN AEROBIC AND ANAEROBIC 5ML  Final   Culture   Final    NO GROWTH 5 DAYS Performed at [facility]    Report Status 02/02/2015 FINAL  Final  Urine culture     Status: None   Collection Time: 01/28/15 10:20 PM  Result Value Ref Range Status   Specimen Description URINE, CATHETERIZED  Final   Special Requests NONE  Final   Culture   Final    MULTIPLE SPECIES PRESENT, SUGGEST RECOLLECTION Performed at [facility]    Report Status 01/30/2015 FINAL  Final     Discharge Instructions:   Discharge Instructions    Call MD for:  persistant nausea and vomiting    Complete by:  As directed      Call MD for:  severe uncontrolled pain    Complete by:  As directed      Diet general    Complete by:  As directed   Comfort feeds as tolerated.     Increase activity slowly    Complete by:  As directed      Walk with assistance    Complete by:  As directed      Walker     Complete by:  As directed             Medication List    STOP taking these medications        HYDROcodone-acetaminophen 7.5-325 mg/15 ml solution  Commonly known as:  HYCET     metoCLOPramide 10 MG tablet  Commonly known as:  REGLAN     morphine 20 MG/ML concentrated solution  Commonly known as:  ROXANOL     omeprazole 20 MG capsule  Commonly known as:  PRILOSEC     sodium fluoride 1.1 % Gel  dental gel  Commonly known as:  FLUORISHIELD     zolpidem 10 MG tablet  Commonly known as:  AMBIEN      TAKE these medications        acetaminophen 325 MG tablet  Commonly known as:  TYLENOL  Take 650 mg by mouth every 6 (six) hours as needed (For pain.).     albuterol (2.5 MG/3ML) 0.083% nebulizer solution  Commonly known as:  PROVENTIL  Take 3 mLs (2.5 mg total) by nebulization every 4 (four) hours as needed for wheezing or  shortness of breath.     clindamycin 1 % gel  Commonly known as:  CLINDAGEL  Apply 1 application topically 2 (two) times daily.     feeding supplement (OSMOLITE 1.2 CAL) Liqd  Place 237 mLs into feeding tube every 4 (four) hours as needed (Hunger or per request.).     hydrocortisone 1 % ointment  Apply 1 application topically 2 (two) times daily. 1 large tube     lidocaine 2 % solution  Commonly known as:  XYLOCAINE  Mix 1 part 2%viscous lidocaine,1part H2O.Swish and/or swallow 45m of this mixture,34m before meals and at bedtime, up to QID     lidocaine-prilocaine cream  Commonly known as:  EMLA  Apply 1 application topically as needed.     loratadine 10 MG tablet  Commonly known as:  CLARITIN  Take 10 mg by mouth daily as needed for allergies.     Menthol (Topical Analgesic) 16 % Liqd  Apply 1 application topically daily. Applies for shoulder pain.     sucralfate 1 GM/10ML suspension  Commonly known as:  CARAFATE  Take 1 g by mouth 4 (four) times daily as needed (For sore throat.).     valproic acid 250 MG/5ML syrup  Commonly known as:  DEPAKENE  Take 5 mLs (250 mg total) by mouth 2 (two) times daily.          Time coordinating discharge: 35 minutes.  Signed:  RAMA,CHRISTINA  Pager [phone] 02/03/2015, 5:15 PM

## 2015-02-03 NOTE — Progress Notes (Signed)
Report called to Myer Haff at [place].

## 2015-02-03 NOTE — Progress Notes (Signed)
CSW followed up with family regarding plans for discharge and to inform of recommendations for hospice placement. CSW spoke with daughter [name] who informed CSW that she would be interested in [facility]. Daughter [name] became very emotional on the phone, and questioned whether the facility would feed the patient. CSW informed daughter that CSW will inform facility representative to follow up with family regarding details. Daughter expressed interest in conversation and was appreciative of CSW services.   CSW contacted Harmon Pier from [facility], who informed CSW that there are currently no beds available for today. CSW informed Harmon Pier that daughter would like to speak with her regarding facility and their procedures. Harmon Pier informed CSW that she would contact the daughter to inform.   CSW received phone call from patient's daughter [name] regarding her decision not to move forward with hospice placement. Daughter stated, "I will not send him over there if they are not going to feed him". CSW asked daughter about her plans for discharge, and daughter stated "I would like to go with skilled nursing". CSW informed daughter of CSW process and informed daughter that CSW will follow up regarding bed availability. Daughter provided CSW with permission to fax patient clinicals out to [facility]. CSW to complete FL2 for MD signature. CSW will follow up with family.   CSW later received phone call from daughter Aldean Jewett regarding plans for discharge. Mickel Baas informed CSW that sister has been very emotional and is unsure of what to do at this point. CSW discussed sister's plan for SNF placement for the patient. CSW informed Mickel Baas of CSW process. Mickel Baas stated, "There has been so much inconsistency with this hospital and we dealt with the same thing on his last admission". CSW explored Laura's plans, and she is agreeable to SNF placement for the patient. Mickel Baas reports she is in [place], [place] and her sister lives here  in [place]. Mickel Baas stated, "My sister just needs more support and guidance with this process". CSW provided encouragement and informed Mickel Baas that CSW will follow up regarding bed availability. Mickel Baas was appreciative of services provided by CSW.   CSW will continue to follow and provide support while in hospital.   Lucius Conn, [place] Worker [place] [phone]

## 2015-02-03 NOTE — Care Management Important Message (Signed)
Important Message  Patient Details  Name: EDVARDO SEMINARA MRN: [id number] Date of Birth: January 19, 1936   Medicare Important Message Given:  Yes    Shelda Altes 02/03/2015, 1:35 PMImportant Message  Patient Details  Name: ERACLIO LORD MRN: [id number] Date of Birth: 16-Jun-1935   Medicare Important Message Given:  Yes    Shelda Altes 02/03/2015, 1:35 PM

## 2015-02-03 NOTE — Progress Notes (Signed)
Progress Note   Kenneth Jordan [id number] DOB: 05-Jun-1935 DOA: 01/28/2015 PCP: [facility]   Brief Narrative:   Kenneth Jordan is an 79 y.o. male  with PMH of hypertension, GERD, depression, anxiety, arthritis, cancer of base of tongue (diagnosed in August of this year, currently undergoing chemotherapy as well as radiation treatments, last radiation last week), s/p of G-tube, who was admitted 01/28/15 with altered mental status and worsening generalized weakness. In ED, patient was found to have tachycardia, tachypnea, sodium 131, renal function okay, normal lactate,negative troponin, WBC 7.6, temperature normal. CT head is negative for acute abnormalities. Chest x-ray showed hypoventilation. CTA of chest showed no acute pulmonary embolism, but has bilateral lower lobe opacity and volume loss. This may represent pneumonia, aspiration, atelectasis.  Assessment/Plan:   Principal problem:  Sepsis secondary to HCAP versus UTI - Met criteria for sepsis on admission. Source likely urinary versus pneumonia. - Blood cultures negative to date. Urine cultures grew multiple species of bacteria. - Status post fluid volume resuscitation and 4 days of IV antibiotics which have now been discontinued. - Continue comfort care.  Active problems:  Microcytic anemia, multifactorial - Due to recent chemotherapy, malnutrition, dilution, infection, antibiotics.  - No transfusion is indicated at this time. - No further lab draws.  Acute encephalopathy with paranoid behavior / delirium - Likely secondary to sepsis vs hyponatremia.  - Continue depakote.  Cancer of base of tongue ([place]) - Dr. Alvy Bimler consulting. - s/p concurrent cetuximab and radiation therapy, tolerated it poorly. No further treatment. - S/P gastrostomy tube (G tube) placement. - Diet as tolerated, comfort feeds as recommended by speech therapy during prior evaluation.  Protein calorie malnutrition ([place]) - Tube  feeds PRN only hunger or patient request.  GERD - Carafate PRN.  HTN - Not on meds at home.    DVT Prophylaxis - D/C Lovenox in light of comfort measures.   Family Communication/Anticipated D/C date and plan/Code Status   Family Communication:  [name] (daughter) updated at the bedside. Disposition Plan: Likely will need SNF versus ALF versus residential hospice. Anticipated D/C date:   02/03/15, watch over weekend to determine appropriate disposition, given move toward comfort care. Code Status: DNR.   IV Access:    Port-A-Cath right chest   Procedures and diagnostic studies:   Ct Head Wo Contrast  01/28/2015  CLINICAL DATA:  79 year old male with altered mental status. History of tongue cancer. EXAM: CT HEAD WITHOUT CONTRAST TECHNIQUE: Contiguous axial images were obtained from the base of the skull through the vertex without intravenous contrast. COMPARISON:  Head CT dated 09/30/2014 FINDINGS: The ventricles are dilated and the sulci are prominent compatible with age-related atrophy. Periventricular and deep white matter hypodensities represent chronic microvascular ischemic changes. There is no intracranial hemorrhage. No mass effect or midline shift identified. The visualized paranasal sinuses are well aerated. There is minimal mucoperiosteal thickening of the mastoid air cells. Multiple calvarial lucencies are stable compared to the prior study and of indeterminate clinical significance. IMPRESSION: No acute intracranial pathology. Age-related atrophy and chronic microvascular ischemic disease. If symptoms persist and there are no contraindications, MRI may provide better evaluation if clinically indicated. Electronically Signed   By: Anner Crete M.D.   On: 01/28/2015 20:50   Ct Angio Chest Pe W/cm &/or Wo Cm  01/29/2015  CLINICAL DATA:  Tachycardia. EXAM: CT ANGIOGRAPHY CHEST WITH CONTRAST TECHNIQUE: Multidetector CT imaging of the chest was performed using the standard  protocol during bolus administration of intravenous  contrast. Multiplanar CT image reconstructions and MIPs were obtained to evaluate the vascular anatomy. CONTRAST:  100 mL Omnipaque 350 intravenous COMPARISON:  Radiographs 01/28/2015 FINDINGS: Cardiovascular: There is good opacification of the pulmonary arteries. There is no pulmonary embolism. The thoracic aorta is normal in caliber and intact. Lungs: Patchy and linear opacities in both lower lobes, with associated volume loss. This could represent multifocal pneumonia. Atelectasis is also possibility. Aspiration not excluded. Central airways: Patent Effusions: None Lymphadenopathy: None Esophagus: Unremarkable Upper abdomen: Unremarkable Musculoskeletal: No significant abnormality. Moderately severe degenerative disc changes are present, greatest in the mid thoracic spine. Review of the MIP images confirms the above findings. IMPRESSION: 1. Negative for acute pulmonary embolism. 2. Bilateral lower lobe opacity and volume loss. This may represent pneumonia, aspiration, atelectasis. Electronically Signed   By: Andreas Newport M.D.   On: 01/29/2015 01:44   Dg Chest Port 1 View  01/28/2015  CLINICAL DATA:  Cancer base of tongue.  Short of breath. EXAM: PORTABLE CHEST 1 VIEW COMPARISON:  12/30/2014 FINDINGS: Hypoventilation with decreased lung volume. Increase in bibasilar atelectasis. Both negative for heart failure or pneumonia or effusion Right jugular Port-A-Cath tip in the SVC Moderate to advanced degenerative change in both shoulder joints. IMPRESSION: Hypoventilation with bibasilar atelectasis. Electronically Signed   By: Franchot Gallo M.D.   On: 01/28/2015 16:22     Medical Consultants:    Oncology: Heath Lark, MD  Palliative Care: Knox Royalty, NP  Anti-Infectives:    Rocephin 01/28/15---> 01/29/15  Vancomycin 01/29/15--->01/31/15  Zosyn 01/29/15--->01/31/15    Subjective:   Kenneth Jordan is more lethargic today.  He has been  choking when trying to eat/drink.  No reports of pain.  No N/V.  Objective:    Filed Vitals:   02/02/15 0507 02/02/15 1529 02/02/15 2136 02/03/15 0520  BP: 149/87 140/82 140/74 167/93  Pulse: 92 86 82 82  Temp: 98.7 F (37.1 C) 98.5 F (36.9 C) 98.5 F (36.9 C) 98.3 F (36.8 C)  TempSrc: Oral Oral Oral Oral  Resp: 19 18 20 19   Weight:      SpO2: 98% 98% 99% 97%   No intake or output data in the 24 hours ending 02/03/15 0755 Filed Weights   01/29/15 0220 01/31/15 0257 01/31/15 0301  Weight: 75.116 kg (165 lb 9.6 oz) 75.1 kg (165 lb 9.1 oz) 75.1 kg (165 lb 9.1 oz)    Exam: Gen:  Awake and alert. Cardiovascular:  RRR, No M/R/G Respiratory:  Lungs CTAB Gastrointestinal:  Abdomen soft, NT/ND, + BS Extremities:  No C/E/C   Data Reviewed:    Labs: Basic Metabolic Panel:  Recent Labs Lab 01/28/15 1544 01/29/15 0430 01/30/15 0454  NA 131* 135 136  K 4.8 4.3 4.1  CL 97* 103 103  CO2 27 25 26   GLUCOSE 104* 95 86  BUN 31* 24* 15  CREATININE 0.90 0.73 0.89  CALCIUM 9.0 8.2* 8.6*   GFR Estimated Creatinine Clearance: 69.5 mL/min (by C-G formula based on Cr of 0.89). Liver Function Tests:  Recent Labs Lab 01/29/15 0430  AST 13*  ALT 9*  ALKPHOS 47  BILITOT 0.3  PROT 5.9*  ALBUMIN 2.6*   Coagulation profile  Recent Labs Lab 01/29/15 0019  INR 1.07    CBC:  Recent Labs Lab 01/28/15 1544 01/29/15 0430 01/30/15 0454  WBC 7.6 6.8 5.8  HGB 10.4* 9.2* 9.4*  HCT 33.9* 30.5* 30.5*  MCV 80.0 78.8 79.4  PLT 384 296 348   CBG:  Recent  Labs Lab 01/30/15 2134 01/30/15 2344 01/31/15 0422 01/31/15 0727 01/31/15 1156  GLUCAP 99 127* 84 151* 76   Sepsis Labs:  Recent Labs Lab 01/28/15 1544 01/28/15 1613 01/28/15 1919 01/29/15 0019 01/29/15 0430 01/30/15 0454  PROCALCITON  --   --   --  <0.10  --   --   WBC 7.6  --   --   --  6.8 5.8  LATICACIDVEN  --  1.01 0.94 1.1 0.6  --    Microbiology Recent Results (from the past 240 hour(s))   Blood Culture (routine x 2)     Status: None   Collection Time: 01/28/15  4:00 PM  Result Value Ref Range Status   Specimen Description BLOOD RIGHT CHEST PORTA CATH  Final   Special Requests BOTTLES DRAWN AEROBIC AND ANAEROBIC 5.5ML  Final   Culture   Final    NO GROWTH 5 DAYS Performed at [facility]    Report Status 02/02/2015 FINAL  Final  Blood Culture (routine x 2)     Status: None   Collection Time: 01/28/15  4:54 PM  Result Value Ref Range Status   Specimen Description BLOOD RIGHT ARM  Final   Special Requests BOTTLES DRAWN AEROBIC AND ANAEROBIC 5ML  Final   Culture   Final    NO GROWTH 5 DAYS Performed at [facility]    Report Status 02/02/2015 FINAL  Final  Urine culture     Status: None   Collection Time: 01/28/15 10:20 PM  Result Value Ref Range Status   Specimen Description URINE, CATHETERIZED  Final   Special Requests NONE  Final   Culture   Final    MULTIPLE SPECIES PRESENT, SUGGEST RECOLLECTION Performed at [facility]    Report Status 01/30/2015 FINAL  Final     Medications:   . clindamycin  1 application Topical BID  . hydrocortisone  1 application Topical BID  . MUSCLE RUB  1 application Topical Daily  . sodium chloride  3 mL Intravenous Q12H  . valproic acid  250 mg Oral BID   Continuous Infusions:    Time spent: 25 minutes .  LOS: 5 days     [facility] Pager [phone]. If unable to reach me by pager, please call my cell phone at [phone].  *Please refer to [email or web address], password [email or web address] to get updated schedule on who will round on this patient, as hospitalists switch teams weekly. If 7PM-7AM, please contact night-coverage at [email or web address], password [email or web address] for any overnight needs.  02/03/2015, 7:55 AM

## 2015-02-03 NOTE — Progress Notes (Signed)
Patient has been accepted at [facility]. Daughters [name] and Mickel Baas have agreed to accept bed offer.  Per [place], patient can be transported to facility around [place] is requesting for family to arrive to complete paperwork. CSW informed daughter [name], who has agreed to complete paperwork tonight. CSW will arrange transportation via [facility] for the patient to arrive at facility. No further CSW needs were requested at this time. LOG to be completed by CSW and faxed to supervisor Zack. CSW to sign off.  Please consult if further CSW needs arise.  Lucius Conn, [place] Social Worker [place] [phone]

## 2015-02-04 ENCOUNTER — Ambulatory Visit: Payer: Self-pay

## 2015-02-04 ENCOUNTER — Non-Acute Institutional Stay (SKILLED_NURSING_FACILITY): Payer: Medicare PPO | Admitting: Internal Medicine

## 2015-02-04 DIAGNOSIS — C01 Malignant neoplasm of base of tongue: Secondary | ICD-10-CM | POA: Diagnosis not present

## 2015-02-04 DIAGNOSIS — E871 Hypo-osmolality and hyponatremia: Secondary | ICD-10-CM | POA: Diagnosis not present

## 2015-02-04 DIAGNOSIS — R410 Disorientation, unspecified: Secondary | ICD-10-CM | POA: Diagnosis not present

## 2015-02-04 DIAGNOSIS — E860 Dehydration: Secondary | ICD-10-CM | POA: Diagnosis not present

## 2015-02-05 NOTE — Progress Notes (Addendum)
Patient ID: NYAL EBAUGH, male   DOB: February 20, 1936, 79 y.o.   MRN: [id number]                HISTORY & PHYSICAL  DATE:  02/04/2015        FACILITY: [facility]                    LEVEL OF CARE:   SNF   CHIEF COMPLAINT:  Admission to the facility, post stay at [facility] 01/28/2015 through 02/03/2015.      HISTORY OF PRESENT ILLNESS:  This is a patient who has been undergoing chemo and radiation for cancer at the base of the tongue, followed by Oncology locally.    He was admitted with complaints of altered mental status, worsening generalized weakness.  Recent notes from oncologist suggested dehydration.    In the emergency room, he was found to be tachycardic with a sodium of 131.  His temperature was normal.  Lactate normal, troponin negative.  CT scan of the head showed no acute abnormalities.  A chest x-ray showed hypoventilation with atelectasis.  A CTA of the chest showed no acute pulmonary embolism but did suggest bilateral lower lobe opacities, either pneumonia or atelectasis.  He was felt to meet criteria for sepsis.  His blood and urine cultures were negative.  The urine culture grew multiple species, however.  He was given four days of IV antibiotics, which were discontinued.  There is a statement about, "continuing comfort care".  It was felt his delirium was secondary to hyponatremia.    Finally, it is notable that his tube feedings were to be p.r.n., only if the patient was hungry or patient requests.    Looking back through his records, he states his cancer was discovered in early September.  I cannot really see that.  He said his initial evaluation was at the [facility] in [place].   Staging of this was a stage IVA, T4a, N2cM0.  His last CT scan of the neck was on 12/30/2014, regressed but not resolved mass-like hyperenhancement of the left tongue base.  Noted to have malignant lymphadenopathy, slightly increased in size from August.  Right-sided cervical lymph nodes appeared  stable.  The ones on the left were malignant.   He was also noted on CT scan to have advanced cervical spinal degeneration and degenerative spinal stenosis.    The patient really has very little complaint today.  He says his throat is irritated, but not particularly painful.  He states he does not know anything about the status of his cancer or its treatment currently.    PAST MEDICAL HISTORY/PROBLEM LIST:            Cancer of the base of the tongue.    Hypertension.    Retinitis pigmentosa.    Depression with anxiety.    Gastroesophageal reflux disease.    Arthritis.    Insomnia.    PAST SURGICAL HISTORY:           Biopsy of the tongue.    Knee surgery.    Tonsillectomy and adenoidectomy.    Feeding tube insertion.    Mutiple dental extractions.    CURRENT MEDICATIONS:  Medication list is reviewed.            Lidocaine viscous swish or swallow 10 mL before meals up to four times daily.    Claritin 10 mg via tube daily for allergies.    Sucralfate 1 g four times a day  with meals via tube.    Valproic acid 250 b.i.d. (not sure of the indication here).    Feeding supplement, Osmolite 235 cc four times daily as needed for hunger or patient requests.    SOCIAL HISTORY:                   HOUSING:  The patient states he lives outside of [place].  Came to [place], since he has family in this area, for treatment of his cancer.   FUNCTIONAL STATUS:  He seems to know very little about the status of his malignancy.  I did not press him on any further information about his palliative/hospice status at this point.   ADVANCED DIRECTIVES:  He has a DNR on the chart.    REVIEW OF SYSTEMS:       General: significant weight loss, not able to quantitate HEENT:   No headache.  He has some oropharyngeal discomfort.     CHEST/RESPIRATORY:  No cough.  No sputum.    CARDIAC:  No chest pain.    GI:  No nausea, vomiting, or diarrhea.   GU:  No dysuria.    MUSCULOSKELETAL:  He does  not describe any neck pain, back pain, or joint pain.    NEUROLOGICAL:  Tells me he is able to walk.     MS: denies depression Endocrine: no diabetes  PHYSICAL EXAMINATION:   GENERAL APPEARANCE:  The patient looks stable.   HEENT:   MOUTH/THROAT: His tongue deviates to the right.  Mucous membranes are dry.  He has irritation of the oropharynx, probably related to radiation.  I did not attempt to see the base of his tongue.   NECK/THYROID:  There is palpable adenopathy in the cervical chain.  Nothing in the supraclavicular or axillary.   CHEST/RESPIRATORY:  Exam is clear.    CARDIOVASCULAR:   CARDIAC:  Heart sounds are normal.   GASTROINTESTINAL:   LIVER/SPLEEN/KIDNEY:   No liver, no spleen.  No tenderness.    GENITOURINARY:   BLADDER:  Not distended.  There is no CVA tenderness.   CIRCULATION:   EDEMA/VARICOSITIES:  Extremities:  No edema.     ARTERIAL:  Peripheral pulses are palpable.   NEUROLOGICAL:   BALANCE/GAIT:  I did not attempt to ambulate him.   PSYCHIATRIC:   MENTAL STATUS:  His speech is soft; however, comprehensible.  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  He is status post radiation therapy and chemotherapy.  I do not know much about this other than the CT scan quoted above.  The patient seemed unaware of the status of his tumor or the plans for any further therapy.  In spite of the treatment for pure palliative care made in his discharge summary from the hospital, the patient does not seem to really indicate that he understands much about that, although I did not exactly press the issue completely today. He does not look like he is in a terminal situration  Palliative oral intake through his G-tube.  Again, I did not press him on this.  I will speak to the nurses in the building about their understanding of this order.  I am not exactly sure what the patient is taking orally at this  point.    Encephalopathy.  As far as I can tell, this has cleared.  A sodium of 131  does not usually cause delirium in patients.  However, he may have been dehydrated, as well.    Hypertension.  Not currently on any treatment.    History of gastroesophageal reflux disease.  Again, reasonably asymptomatic.    He has osteoarthritis of shoulders, knees, and also cervical disease noted on a CT scan, although he is not making any specific complaints about this.    The patient is a DNR.  The tone of the discharge summary done by one of the hospitalists, whom I know, suggested that this patient is purely comfort care, although the patient does not seem to easily get into a conversation about this and I did not press this at a first meeting.  I will discuss things with the staff.        I went on to have extensive discussions with this patient's responsible party who is his daughter. She was able to verify most of the history that the patient was able to tell me himself. I think this scenario here is that this man developed the really florid delirium and was felt perhaps to be in a terminal state however with IV fluids correction of his sodium he seems to be making a recovery from this regard. He did not come here with a diet after talking with the speech therapist we went ahead and started him on a pured diet with thin liquids. I have given him routine supplements Osmolite 1 can 3 times a day as well as free fluid flushes through the PEG tube. It turns out he can take in adequate oral intake then we can stop using the gastrostomy tube.  Although I think the patient's terminal tone from the hospital is appropriate premature, I did talk to the daughter about a DO NOT RESUSCITATE order which she is agreed to. Would like to see when he has follow-up with his oncologist to [facility] he apparently completed radiation and more recently chemotherapy although he apparently tolerated the latter poorly per his daughter

## 2015-02-07 ENCOUNTER — Ambulatory Visit: Payer: Self-pay

## 2015-02-07 ENCOUNTER — Ambulatory Visit: Payer: Self-pay | Admitting: Hematology and Oncology

## 2015-02-10 ENCOUNTER — Telehealth: Payer: Self-pay | Admitting: *Deleted

## 2015-02-10 NOTE — Telephone Encounter (Addendum)
  Oncology Nurse Navigator Documentation   Navigator Encounter Type: Other (02/10/15 1203) Patient Visit Type: Follow-up (02/10/15 1203)     Spoke with patient dtr [name].    She reported her dad is doing moderately well at [facility].  She is unclear how well he is participating in rehab.  We discussed the scheduling of a f/u appt with Dr. Isidore Moos.  She understands she will receive a call to arrange.  [name], RN, BSN, [place] at [place] [phone]                   Time Spent with Patient: 15 (02/10/15 1203)

## 2015-02-11 ENCOUNTER — Non-Acute Institutional Stay (SKILLED_NURSING_FACILITY): Payer: Medicare PPO | Admitting: Internal Medicine

## 2015-02-11 DIAGNOSIS — C01 Malignant neoplasm of base of tongue: Secondary | ICD-10-CM | POA: Diagnosis not present

## 2015-02-11 DIAGNOSIS — R410 Disorientation, unspecified: Secondary | ICD-10-CM

## 2015-02-11 DIAGNOSIS — E871 Hypo-osmolality and hyponatremia: Secondary | ICD-10-CM | POA: Diagnosis not present

## 2015-02-14 ENCOUNTER — Encounter: Payer: Self-pay | Admitting: *Deleted

## 2015-02-14 ENCOUNTER — Encounter: Payer: Self-pay | Admitting: Adult Health

## 2015-02-14 ENCOUNTER — Ambulatory Visit
Admission: RE | Admit: 2015-02-14 | Discharge: 2015-02-14 | Disposition: A | Discharge status: Home / Self Care | Payer: Non-veteran care | Source: Ambulatory Visit | Attending: Radiation Oncology | Admitting: Radiation Oncology

## 2015-02-14 ENCOUNTER — Encounter: Payer: Self-pay | Admitting: Radiation Oncology

## 2015-02-14 VITALS — BP 123/73 | HR 88 | Temp 98.1°F | Ht 69.0 in | Wt 159.7 lb

## 2015-02-14 DIAGNOSIS — C01 Malignant neoplasm of base of tongue: Secondary | ICD-10-CM

## 2015-02-14 NOTE — Progress Notes (Signed)
I briefly met with Mr. Esselman and his daughter, [name], following their visit with Dr. Isidore Moos today, at the request of Dr. Isidore Moos and [name], RN.  Mr. Silliman is currently living at [facility] after a recent hospitalization and is participating in PT there.  He reports that other than "my room is so cold" and the food he is served, he is largely without complaints.  He would like to try some oatmeal or grits, instead of the pureed foods he is served.  I think that reasonable. He is continuing to work with the speech therapists at the facility.  He reports that he was uncovered overnight and woke up very cold and woke up with a sore throat.  He states that once he was able to get warm again, he felt a little better.  He tells me that he isn't sleeping well, which is attributes to having to get up to use the restroom to urinate about every 2 hours.    Upon his recent discharge from the hospital, there was mention of "continuing comfort care" and that his status is stable, but terminal.  He has a DNR order on file in the hospital and at the SNF.    When I asked the patient today about his perception of how he is doing, he said, "I'd give myself a C+ grade."  The main things he is able to identify that are troubling to him are the temperature in his room/living environment and the choices of food he has on a pureed diet.     Plan:  Regarding pain, I encouraged Mr. Milliron to ask his nursing staff at the SNF for pain medication when he felt he needed it. He agreed.  I will see him in mid-January, likely with Wadie Lessen, NP from palliative care to see how we can both optimize his quality of life and control his symptoms.  I gave Mr. Tourigny daughter my business card and encouraged her to call me or email me with any questions.  She is concerned regarding the patient's insurance and the coverage for SNF that will end on 02/22/15, whereby after that there is a $160 co-pay.  She is considering moving her dad  to [facility] facility.  I encouraged her to speak with the staff at [facility] and at [facility] about the appropriate avenues to initiate a transfer of care, as I can offer very little guidance or suggestions.  I encouraged her to call me or have the staff at the facility call me with any questions, concerns, or orders needed and I can help facilitate that.  Mr. Reetz and his daughter voiced verbal understanding of this plan.  We will contact his daughter, [name], when appointments are made for mid-January.      Mike Craze, NP [place] [phone]

## 2015-02-14 NOTE — Progress Notes (Signed)
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 Cancer of base of tongue ([place]) 141.0 C01    Radiation treatment dates:  12-02-14 to 01-22-15 Site/dose:   base of tongue and bilateral neck / 70 Gy in 35 fractions to gross disease, 63 Gy in 35 fractions to high risk nodal echelons, and 56 Gy in 35 fractions to intermediate risk nodal echelons  Narrative:  The patient returns today for routine follow-up. He reports his throat pain has been much better, he does say his throat was sore this morning. Currently, his throat is not sore but has a tingling feeling. The pain is not attributed with swallowing. He is receiving Osmolite 1.5 1 can 3 times a day and his PEG tube is flushed three times a day with 400 cc's. He is eating a pureed diet at the SNF, but his family is not sure how much he is actually eating. He complains of pain when swallowing. He is seeing a speech therapist to assist with swallowing. He is supervised while eating at the SNF. He has not been to see an ENT since his original diagnosis. He stays at [facility] facility. His daughter would like to have her father moved to another nursing facility and will discuss with the Soc Worker there. He has no follow ups with Dr. Alvy Bimler.   He was hospitalized in late November early December with acute encephalopathy / sepsis secondary to pneumonia versus UTI. He was discharged to an SNF. Comfort care was recommended. Palliative care service was incorporated into his family's decision making.   He seems to be improving, now, in SNF.                 ALLERGIES:  is allergic to dilaudid and other.  Meds: Current Outpatient  Prescriptions  Medication Sig Dispense Refill  . acetaminophen (TYLENOL) 325 MG tablet Take 650 mg by mouth every 6 (six) hours as needed (For pain.).    [place] Kitchen albuterol (PROVENTIL) (2.5 MG/3ML) 0.083% nebulizer solution Take 3 mLs (2.5 mg total) by nebulization every 4 (four) hours as needed for wheezing or shortness of breath. 75 mL 12  . clindamycin (CLINDAGEL) 1 % gel Apply 1 application topically 2 (two) times daily.   2  . fluconazole (DIFLUCAN) 200 MG tablet Take 200 mg by mouth daily.    . hydrocortisone 1 % ointment Apply 1 application topically 2 (two) times daily. 1 large tube 56 g 0  . lidocaine (XYLOCAINE) 2 % solution Mix 1 part 2%viscous lidocaine,1part H2O.Swish and/or swallow 42mL of this mixture,41min before meals and at bedtime, up to QID 100 mL 5  . lidocaine-prilocaine (EMLA) cream Apply 1 application topically as needed.  3  . loratadine (CLARITIN) 10 MG tablet Take 10 mg by mouth daily as needed for allergies.    . Menthol, Topical Analgesic, 16 % LIQD Apply 1 application topically daily. Applies for shoulder pain.    . mirtazapine (REMERON) 15 MG tablet Take 15 mg by mouth at bedtime.    . Nutritional Supplements (FEEDING SUPPLEMENT, OSMOLITE 1.2 CAL,) LIQD Place 237 mLs into feeding tube every 4 (four) hours as needed (Hunger  or per request.).  0  . Nutritional Supplements (FEEDING SUPPLEMENT, OSMOLITE 1.5 CAL,) LIQD Place 237 mLs into feeding tube 3 (three) times daily.    . sucralfate (CARAFATE) 1 GM/10ML suspension Take 1 g by mouth 4 (four) times daily as needed (For sore throat.).    [place] Kitchen valproic acid (DEPAKENE) 250 MG/5ML syrup Take 5 mLs (250 mg total) by mouth 2 (two) times daily. 240 mL 12   No current facility-administered medications for this encounter.    Physical Findings: The patient is in no acute distress. Patient is alert and oriented. Wt Readings from Last 3 Encounters:  02/14/15 159 lb 11.2 oz (72.439 kg)  01/31/15 165 lb 9.1 oz (75.1 kg)  01/20/15 169  lb 3.2 oz (76.749 kg)    height is 5\' 9"  (1.753 m) and weight is 159 lb 11.2 oz (72.439 kg). His temperature is 98.1 F (36.7 C). His blood pressure is 123/73 and his pulse is 88. .  General: Alert and oriented, in no acute distress HEENT: Head is normocephalic. Extraocular movements are intact. Oropharynx is notable for Resolving confluent mucositis over his palatte and uvula. Whitish tissue consistant with mucositis or mucous over the posterior pharyngeal wall. Tongue deviates to the right. No thrush. Mucous membranes are moist. Neck: Neck is notable for palpable mass in the left submandibular region, possibly the submandibular gland. Posteriorly in the left level 3 region of the neck there is some thickening but no discrete mass.  Skin: Skin in treatment fields shows satisfactory healing  Abdomen:   Feeding tube site appears without any infection over the abdomen. Lymphatics: see Neck Exam   Lab Findings: Lab Results  Component Value Date   WBC 5.8 01/30/2015   HGB 9.4* 01/30/2015   HCT 30.5* 01/30/2015   MCV 79.4 01/30/2015   PLT 348 01/30/2015    Lab Results  Component Value Date   TSH 0.669 11/25/2014    Radiographic Findings: Ct Head Wo Contrast  01/28/2015  CLINICAL DATA:  78 year old male with altered mental status. History of tongue cancer. EXAM: CT HEAD WITHOUT CONTRAST TECHNIQUE: Contiguous axial images were obtained from the base of the skull through the vertex without intravenous contrast. COMPARISON:  Head CT dated 09/30/2014 FINDINGS: The ventricles are dilated and the sulci are prominent compatible with age-related atrophy. Periventricular and deep white matter hypodensities represent chronic microvascular ischemic changes. There is no intracranial hemorrhage. No mass effect or midline shift identified. The visualized paranasal sinuses are well aerated. There is minimal mucoperiosteal thickening of the mastoid air cells. Multiple calvarial lucencies are stable compared  to the prior study and of indeterminate clinical significance. IMPRESSION: No acute intracranial pathology. Age-related atrophy and chronic microvascular ischemic disease. If symptoms persist and there are no contraindications, MRI may provide better evaluation if clinically indicated. Electronically Signed   By: Anner Crete M.D.   On: 01/28/2015 20:50   Ct Angio Chest Pe W/cm &/or Wo Cm  01/29/2015  CLINICAL DATA:  Tachycardia. EXAM: CT ANGIOGRAPHY CHEST WITH CONTRAST TECHNIQUE: Multidetector CT imaging of the chest was performed using the standard protocol during bolus administration of intravenous contrast. Multiplanar CT image reconstructions and MIPs were obtained to evaluate the vascular anatomy. CONTRAST:  100 mL Omnipaque 350 intravenous COMPARISON:  Radiographs 01/28/2015 FINDINGS: Cardiovascular: There is good opacification of the pulmonary arteries. There is no pulmonary embolism. The thoracic aorta is normal in caliber and intact. Lungs: Patchy and linear opacities in both lower lobes, with associated volume loss. This could represent  multifocal pneumonia. Atelectasis is also possibility. Aspiration not excluded. Central airways: Patent Effusions: None Lymphadenopathy: None Esophagus: Unremarkable Upper abdomen: Unremarkable Musculoskeletal: No significant abnormality. Moderately severe degenerative disc changes are present, greatest in the mid thoracic spine. Review of the MIP images confirms the above findings. IMPRESSION: 1. Negative for acute pulmonary embolism. 2. Bilateral lower lobe opacity and volume loss. This may represent pneumonia, aspiration, atelectasis. Electronically Signed   By: Andreas Newport M.D.   On: 01/29/2015 01:44   Dg Chest Port 1 View  01/28/2015  CLINICAL DATA:  Cancer base of tongue.  Short of breath. EXAM: PORTABLE CHEST 1 VIEW COMPARISON:  12/30/2014 FINDINGS: Hypoventilation with decreased lung volume. Increase in bibasilar atelectasis. Both negative for  heart failure or pneumonia or effusion Right jugular Port-A-Cath tip in the SVC Moderate to advanced degenerative change in both shoulder joints. IMPRESSION: Hypoventilation with bibasilar atelectasis. Electronically Signed   By: Franchot Gallo M.D.   On: 01/28/2015 16:22    Impression/Plan:    1) Head and Neck Cancer Status: Healing from treatment.   2) Nutritional Status: He is losing weight but receiving Osmolite through his tube and eating a pureed diet at his SNF. PEG tube: No issues.  3) Risk Factors: The patient has been educated about risk factors including alcohol and tobacco abuse; they understand that avoidance of alcohol and tobacco is important to prevent recurrences as well as other cancers  4) Swallowing: Pureed diet with supervision at SNF. Also seen in SLP at [facility].  5) Dental: Encouraged to continue regular followup with dentistry, and dental hygiene including fluoride rinses.   6) Thyroid function:  Lab Results  Component Value Date   TSH 0.669 11/25/2014    7) Other: I'll send a note to Dr. Alvy Bimler letting her know that the patient's condition has improved to see if she wants to resume follow up with him.   8) Follow-up in a few weeks with Mike Craze NP of survivorship and possibly Wadie Lessen NP of palliative care to discuss Glenda Chroman clinical status and goals of care. I will see the patient thereafter after discussion with Mike Craze. I will reach out to Dr. Alvy Bimler to see if medical oncology would like to follow up with the patient. The patient was encouraged to call with any issues or questions before then.  _____________________________________   Eppie Gibson, MD   This document serves as a record of services personally performed by Eppie Gibson, MD. It was created on her behalf by Arlyce Harman, a trained medical scribe. The creation of this record is based on the scribe's personal observations and the provider's statements to them. This document has  been checked and approved by the attending provider.

## 2015-02-14 NOTE — Progress Notes (Signed)
  Oncology Nurse Navigator Documentation   Navigator Encounter Type: Other (02/14/15 1315) Patient Visit Type: Radonc;Follow-up (02/14/15 1315)     To provide support and encouragement, care continuity and to assess for needs, met with patient during his f/u appt with Dr. Isidore Moos s/p 01/22/15 RT completion.  He was accompanied by his dtr [name].  He has been residing at [facility] since his 12/5 DC from [place]. Dtr reported:  Button PEG came out, was replaced.  Upon inspection, it is a large bore PEG.  He is being seen by SLP who suggested his recovery would be better served if he was in a facility with patients who have higher cognitive levels, ie his opportunity for higher level interpersonal interactions.  [name] indicated she is going to look into his transfer to [facility]. He reported dislike of pureed diet, as a result his oral intake is minimal, he continues to lose weight.  Dr. Isidore Moos aware. Plan is for him to follow-up with NP Mike Craze and Palliative NP Wadie Lessen in early January. Dtr understands she can contact me with needs/concerns.    [name], RN, BSN, [place] at [place] [phone]                   Time Spent with Patient: 45 (02/14/15 1315)

## 2015-02-14 NOTE — Progress Notes (Signed)
Kenneth Jordan presents for follow up of radiation completed 01/22/15 to his Base of Tongue and Bilateral neck.  Pain issues, if any: He reports his throat pain has been much better, he does say his throat was sore this morning.  Using a feeding tube?: Yes, He is receiving Osmolite 1.5  1 can 3 times a day and his PEG tube is flushed three times a day with 400 cc's. He is eating a pureed diet at the SNF, but his family is not sure how much he is actually eating.  Weight changes, if any:  Wt Readings from Last 3 Encounters:  02/14/15 159 lb 11.2 oz (72.439 kg)  01/31/15 165 lb 9.1 oz (75.1 kg)  01/20/15 169 lb 3.2 oz (76.749 kg)   Swallowing issues, if any: He complains of pain when swallowing. He is seeing a speech therapist to assist with swallowing. He is supervised while eating at the SNF.  Smoking or chewing tobacco? No Using fluoride trays daily? No Last ENT visit was on: No, not since original diagnosis Other notable issues, if any: None  BP 123/73 mmHg  Pulse 88  Temp(Src) 98.1 F (36.7 C)  Ht 5\' 9"  (1.753 m)  Wt 159 lb 11.2 oz (72.439 kg)  BMI 23.57 kg/m2  Orthostatic BP sitting 123/73 pulse 88. BP standing 95/66, pulse 103.

## 2015-02-17 ENCOUNTER — Other Ambulatory Visit: Payer: Self-pay | Admitting: Hematology and Oncology

## 2015-02-17 ENCOUNTER — Telehealth: Payer: Self-pay | Admitting: Hematology and Oncology

## 2015-02-17 NOTE — Telephone Encounter (Signed)
Spoke with kelly and she is aware of the appointment and will see if her sister can bring him and will let me know

## 2015-02-17 NOTE — Progress Notes (Signed)
Patient ID: Kenneth Jordan, male   DOB: [birth year], 79 y.o.   MRN: [id number]                PROGRESS NOTE  DATE:  02/11/2015        FACILITY: [facility]               LEVEL OF CARE:   SNF   Acute Visit                 CHIEF COMPLAINT:  Follow up oral intake.    HISTORY OF PRESENT ILLNESS:  This is a patient whom I admitted to the building last week after a stay at [facility] from 01/28/2015 through 02/03/2015.    He is a man who has been treated with radiation and chemotherapy for cancer at the base of his tongue.    He was admitted to hospital frankly delirious, hyponatremic, and dehydrated.  He was treated for the dehydration and his electrolytes improved, and his mental status had largely resolved by the time he came here.    However, he came here with no real diet order.  He has a PEG tube in place.  I had the speech therapist work with him.  However, he is now on Osmolite 1.5 q.4 hours six times a day.  He has not, of course, been eating although this may be a lot of the time because of pain in his throat.  He does not describe esophageal pain or true dysphagia.  I have reviewed this extensively with the speech therapist in the facility.    REVIEW OF SYSTEMS:    GENERAL:  He has not lost any weight.  He has actually put on five pounds since he has been here.   CHEST/RESPIRATORY:  No cough.  No sputum.    CARDIAC:  No chest pain.   GI:  No clear dysphagia or odynophagia.  No diarrhea.    PHYSICAL EXAMINATION:   GENERAL APPEARANCE:  The patient looks depressed.  Gets easily angered.      ASSESSMENT/PLAN:              Cancer at the base of his tongue.    The base of his tongue is impossible to see.  He has irritated areas on his soft palate posteriorly.  This could be radiation injury.  I am also wondering whether this could be a pseudomembrane from candida.  I think it might be worthwhile going ahead and trying to empirically treat him.  He is not losing weight.  In fact,  he has gained some weight.   I did not recheck his sodium.  I suppose I should do this.     Delirium.  There is no evidence of this, nor has there been any evidence at the times I have seen him.  However, he does appear depressed.    He is not really tolerating a full oral intake.   I wonder how much of this is due to the six feedings we are giving him.  I am going to cut back on this a bit to see if we can manage to increase his oral intake.   It is not impossible to send him home with a PEG tube, although I am hopeful that that may not be necessary.

## 2015-02-18 ENCOUNTER — Non-Acute Institutional Stay (SKILLED_NURSING_FACILITY): Payer: Medicare PPO | Admitting: Internal Medicine

## 2015-02-18 DIAGNOSIS — R1319 Other dysphagia: Secondary | ICD-10-CM

## 2015-02-18 DIAGNOSIS — C01 Malignant neoplasm of base of tongue: Secondary | ICD-10-CM

## 2015-02-21 ENCOUNTER — Other Ambulatory Visit: Payer: Self-pay | Admitting: *Deleted

## 2015-02-21 MED ORDER — ENSURE PLUS PO LIQD: 1.0000 | Freq: Three times a day (TID) | ORAL | Status: DC

## 2015-02-24 NOTE — Progress Notes (Addendum)
Patient ID: AMAY MIJANGOS, male   DOB: 02/02/1936, 79 y.o.   MRN: [id number]                PROGRESS NOTE  DATE:  02/18/2015       FACILITY: [facility]                 LEVEL OF CARE:   SNF   Acute Visit             CHIEF COMPLAINT:  Follow up issues.      HISTORY OF PRESENT ILLNESS:  Mr. Legan continues to make improvement.  He has been to see Radiation Oncology.  I think they were satisfied with his appearance and referred him back to Medical Oncology.    He continues to make good improvement in his oral intake.  He is also improving with speech therapy in terms of the textures of foods they are recommending, currently mechanical soft with chopped meats.  He is receiving one can of supplemental Osmolite three times a day, but it is recommended that we drop back on this further.    A daughter who is present today is from [place].  I have not previously met her.  I have dealt with a local daughter.  However, she was able to show me pictures of him two years ago.  He is actually a fairly overweight man.  He has certainly lost a lot of weight, although I think things have generally been improving as noted above.    REVIEW OF SYSTEMS:    GENERAL:  His weight is 157 pounds.  This is stable over the last two weeks.   CHEST/RESPIRATORY:  No cough.  No sputum.    CARDIAC:  No chest pain.   GI:  He is not having frank dysphagia, although he is not eating complete meals.    PHYSICAL EXAMINATION:   GENERAL APPEARANCE:  The patient looks remarkably well.  His affect is brighter.   HEENT:   MOUTH/THROAT:  His throat and soft palate actually look a lot better.     CARDIOVASCULAR:   CARDIAC:  Heart sounds are normal.  He is not dehydrated.       MUSCULOSKELETAL:   EXTREMITIES:   LEFT LOWER EXTREMITY:  He has a paronychia on the left great toe.  I will take care of this.   PSYCHIATRIC:   MENTAL STATUS:  The depression angle seems a lot better.  I started him on Remeron two weeks ago, both as  an antidepressant and appetite stimulant.  This seems to have worked nicely.    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  He continues to do well.  I reduced the frequency of his tube feeding.  I changed the consistency of his diet as recommended by Speech Therapy.

## 2015-02-27 ENCOUNTER — Other Ambulatory Visit (HOSPITAL_BASED_OUTPATIENT_CLINIC_OR_DEPARTMENT_OTHER): Payer: Non-veteran care

## 2015-02-27 ENCOUNTER — Encounter: Payer: Self-pay | Admitting: Hematology and Oncology

## 2015-02-27 ENCOUNTER — Telehealth: Payer: Self-pay | Admitting: Hematology and Oncology

## 2015-02-27 ENCOUNTER — Encounter: Payer: Self-pay | Admitting: *Deleted

## 2015-02-27 ENCOUNTER — Ambulatory Visit: Payer: Non-veteran care

## 2015-02-27 ENCOUNTER — Ambulatory Visit (HOSPITAL_BASED_OUTPATIENT_CLINIC_OR_DEPARTMENT_OTHER): Payer: Non-veteran care | Admitting: Hematology and Oncology

## 2015-02-27 VITALS — BP 115/62 | HR 70 | Temp 98.2°F | Resp 16 | Ht 69.0 in | Wt 159.5 lb

## 2015-02-27 DIAGNOSIS — Z09 Encounter for follow-up examination after completed treatment for conditions other than malignant neoplasm: Secondary | ICD-10-CM

## 2015-02-27 DIAGNOSIS — E46 Unspecified protein-calorie malnutrition: Secondary | ICD-10-CM | POA: Diagnosis not present

## 2015-02-27 DIAGNOSIS — C01 Malignant neoplasm of base of tongue: Secondary | ICD-10-CM | POA: Diagnosis not present

## 2015-02-27 DIAGNOSIS — R471 Dysarthria and anarthria: Secondary | ICD-10-CM

## 2015-02-27 LAB — CBC WITH DIFFERENTIAL/PLATELET
BASO%: 0.4 % (ref 0.0–2.0)
BASOS ABS: 0 10*3/uL (ref 0.0–0.1)
EOS ABS: 0.2 10*3/uL (ref 0.0–0.5)
EOS%: 4.3 % (ref 0.0–7.0)
HEMATOCRIT: 38.8 % (ref 38.4–49.9)
HEMOGLOBIN: 12.1 g/dL — AB (ref 13.0–17.1)
LYMPH#: 0.8 10*3/uL — AB (ref 0.9–3.3)
LYMPH%: 15.1 % (ref 14.0–49.0)
MCH: 24.4 pg — AB (ref 27.2–33.4)
MCHC: 31.2 g/dL — AB (ref 32.0–36.0)
MCV: 78.4 fL — AB (ref 79.3–98.0)
MONO#: 0.7 10*3/uL (ref 0.1–0.9)
MONO%: 12.5 % (ref 0.0–14.0)
NEUT#: 3.6 10*3/uL (ref 1.5–6.5)
NEUT%: 67.7 % (ref 39.0–75.0)
PLATELETS: 225 10*3/uL (ref 140–400)
RBC: 4.95 10*6/uL (ref 4.20–5.82)
RDW: 16.2 % — ABNORMAL HIGH (ref 11.0–14.6)
WBC: 5.3 10*3/uL (ref 4.0–10.3)

## 2015-02-27 LAB — COMPREHENSIVE METABOLIC PANEL
ALBUMIN: 3 g/dL — AB (ref 3.5–5.0)
ALK PHOS: 59 U/L (ref 40–150)
ALT: 9 U/L (ref 0–55)
ANION GAP: 8 meq/L (ref 3–11)
AST: 18 U/L (ref 5–34)
BUN: 12.6 mg/dL (ref 7.0–26.0)
CALCIUM: 9.2 mg/dL (ref 8.4–10.4)
CHLORIDE: 101 meq/L (ref 98–109)
CO2: 26 mEq/L (ref 22–29)
Creatinine: 0.9 mg/dL (ref 0.7–1.3)
EGFR: 78 mL/min/{1.73_m2} — AB (ref >90)
Glucose: 82 mg/dl (ref 70–140)
POTASSIUM: 4.5 meq/L (ref 3.5–5.1)
Sodium: 135 mEq/L — ABNORMAL LOW (ref 136–145)
Total Bilirubin: 0.35 mg/dL (ref 0.20–1.20)
Total Protein: 6.5 g/dL (ref 6.4–8.3)

## 2015-02-27 MED ORDER — ENSURE PLUS PO LIQD: 1.0000 | Freq: Three times a day (TID) | ORAL | Status: AC

## 2015-02-27 NOTE — Progress Notes (Unsigned)
Port flushed in hospital 02/03/15. Pt requests labs peripherally today.

## 2015-02-27 NOTE — Progress Notes (Signed)
[facility] OFFICE PROGRESS NOTE  Patient Care Team: Pcp Not In System as PCP - General Leota Sauers, RN as Oncology Nurse Navigator Heath Lark, MD as Consulting Physician (Hematology and Oncology) Eppie Gibson, MD as Attending Physician (Radiation Oncology) Karie Mainland, RD as Dietitian (Nutrition)  SUMMARY OF ONCOLOGIC HISTORY: Oncology History   Cancer of base of tongue   Staging form: Lip and Oral Cavity, AJCC 7th Edition     Clinical stage from 11/08/2014: Stage IVA (T4a, N2c, M0) - Signed by Heath Lark, MD on 11/08/2014       Cancer of base of tongue ([place])   07/08/2014 Miscellaneous  the patient noted tongue swelling / mass   10/04/2014 Imaging  CT scan done at the [facility] show large tongue mass with bilateral neck lymphadenopathy   10/07/2014 Miscellaneous  he was seen at the [facility] for evaluation was noted to have tongue cancer   10/10/2014 Imaging  PET CT scan from the [facility] showed extensive regional lymphadenopathy on the left side including supraclavicular lymph node involvement and possibly right jugulodigastric lymph node involvement.   10/24/2014 Procedure  he underwent laryngoscopy and biopsy. there is a large ulcerative tongue a mass on the left involving the tonsil, soft palate, epiglottis.   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 Treatment was stopped due to decline in performance status and various side-effects   12/04/2014 - 01/22/2015 Radiation Therapy Received helical IMRT Tomotherapy:  Base of tongue and bilateral neck / 70 Gy in 35 fractions to gross disease, 63 Gy in 35 fractions to high risk nodal echelons, and 56 Gy in 35 fractions to intermediate risk nodal echelons.   12/04/2014 Adverse Reaction he had  infusion reaction from Erbitux   12/17/2014 Adverse Reaction He is noted to have worsening skin rash and oral mucositis related to side effects of Erbitux. The dose of Erbitux is reduced by 50%   12/30/2014 - 01/03/2015 Hospital Admission He was admitted to the hospital for altered mental status and sepsis. He received antibiotics for UTI and fluconazole for thrush   12/30/2014 Imaging CT Neck w Contrast:  Some regression of the large left tongue tumor with involvement of the sublingual space; tumor margins now indistinct.  Stable to slightly increased malignant left level 2 and level 3 nodes since August.    01/28/2015 - 02/03/2015 Hospital Admission Admitted for altered mental status, worsening generalized weakness.  Received antibiotics for urinary sepsis.      INTERVAL HISTORY: Please see below for problem oriented charting.  he is seen today for further follow-up. He has great improvement since he was last seen. His mental status has improved and he is participating in therapy at the skilled nursing facility.  there were no reported confusion episode.  he has persistent dysarthria and occasional choking sensation after he eats. He had persistent mild dysphagia. He has skin a lot of weight since he was last seen. He denies pain.  REVIEW OF SYSTEMS:   Constitutional: Denies fevers, chills or abnormal weight loss Eyes: Denies blurriness of vision Ears, nose, mouth, throat, and face: Denies mucositis or sore throat Respiratory: Denies cough, dyspnea or wheezes Cardiovascular: Denies palpitation, chest discomfort or lower extremity swelling Gastrointestinal:  Denies nausea, heartburn or change in  bowel habits Skin: Denies abnormal skin rashes Lymphatics: Denies new lymphadenopathy or easy bruising Neurological:Denies numbness, tingling or new weaknesses Behavioral/Psych: Mood is stable, no new changes  All other systems were reviewed with the patient and are negative.  I have reviewed  the past medical history, past surgical history, social history and family history with the patient and they are unchanged from previous note.  ALLERGIES:  is allergic to dilaudid and other.  MEDICATIONS:  Current Outpatient Prescriptions  Medication Sig Dispense Refill  . acetaminophen (TYLENOL) 325 MG tablet Take 650 mg by mouth every 6 (six) hours as needed (For pain.).    Marland Kitchen ENSURE PLUS (ENSURE PLUS) LIQD Take 1 Can by mouth 4 (four) times daily - after meals and at bedtime. OR Osmolyte 1.5 via tube 90 Can 6  . Nutritional Supplements (FEEDING SUPPLEMENT, OSMOLITE 1.2 CAL,) LIQD Place 237 mLs into feeding tube every 4 (four) hours as needed (Hunger or per request.).  0  . Nutritional Supplements (FEEDING SUPPLEMENT, OSMOLITE 1.5 CAL,) LIQD Place 237 mLs into feeding tube 3 (three) times daily.    Marland Kitchen valproic acid (DEPAKENE) 250 MG/5ML syrup Take 5 mLs (250 mg total) by mouth 2 (two) times daily. 240 mL 12   No current facility-administered medications for this visit.    PHYSICAL EXAMINATION: ECOG PERFORMANCE STATUS: 2 - Symptomatic, <50% confined to bed  Filed Vitals:   02/27/15 1219  BP: 115/62  Pulse: 70  Temp: 98.2 F (36.8 C)  Resp: 16   Filed Weights   02/27/15 1219  Weight: 159 lb 8 oz (72.349 kg)    GENERAL:alert, no distress and comfortable SKIN: skin color, texture, turgor are normal, no rashes or significant lesions EYES: normal, Conjunctiva are pink and non-injected, sclera clear OROPHARYNX:no exudate, no erythema and lips, buccal mucosa, and tongue normal . No lesion seen on mucositis. NECK: supple, thyroid normal size, non-tender, without nodularity. The wound around his neck has healed LYMPH:  no palpable lymphadenopathy in the cervical, axillary or inguinal LUNGS: clear to auscultation and percussion with normal breathing effort HEART: regular rate & rhythm and no murmurs and no lower extremity edema ABDOMEN:abdomen soft, non-tender and normal bowel sounds.  The feeding tube site looks okay but the tube is almost completely pulled out. It is readjusted. Musculoskeletal:no cyanosis of digits and no clubbing . NEURO: alert & oriented x 3 with mild persistent dysarthria, no focal motor/sensory deficits  LABORATORY DATA:  I have reviewed the data as listed    Component Value Date/Time   NA 135* 02/27/2015 1125   NA 136 01/30/2015 0454   K 4.5 02/27/2015 1125   K 4.1 01/30/2015 0454   CL 103 01/30/2015 0454   CO2 26 02/27/2015 1125   CO2 26 01/30/2015 0454   GLUCOSE 82 02/27/2015 1125   GLUCOSE 86 01/30/2015 0454   BUN 12.6 02/27/2015 1125   BUN 15 01/30/2015 0454   CREATININE 0.9 02/27/2015 1125   CREATININE 0.89 01/30/2015 0454   CALCIUM 9.2 02/27/2015 1125   CALCIUM 8.6* 01/30/2015 0454   PROT 6.5 02/27/2015 1125   PROT 5.9* 01/29/2015 0430   ALBUMIN 3.0* 02/27/2015 1125   ALBUMIN 2.6* 01/29/2015 0430   AST 18 02/27/2015 1125   AST 13* 01/29/2015 0430   ALT 9 02/27/2015 1125   ALT 9* 01/29/2015 0430   ALKPHOS 59 02/27/2015 1125   ALKPHOS 47 01/29/2015 0430   BILITOT 0.35 02/27/2015 1125   BILITOT 0.3 01/29/2015 0430   GFRNONAA >60 01/30/2015 0454  GFRAA >60 01/30/2015 0454    No results found for: SPEP, UPEP  Lab Results  Component Value Date   WBC 5.3 02/27/2015   NEUTROABS 3.6 02/27/2015   HGB 12.1* 02/27/2015   HCT 38.8 02/27/2015   MCV 78.4* 02/27/2015   PLT 225 02/27/2015      Chemistry      Component Value Date/Time   NA 135* 02/27/2015 1125   NA 136 01/30/2015 0454   K 4.5 02/27/2015 1125   K 4.1 01/30/2015 0454   CL 103 01/30/2015 0454   CO2 26 02/27/2015 1125   CO2 26 01/30/2015 0454   BUN 12.6 02/27/2015 1125   BUN 15 01/30/2015 0454   CREATININE 0.9 02/27/2015 1125   CREATININE 0.89 01/30/2015 0454      Component Value Date/Time   CALCIUM 9.2 02/27/2015 1125   CALCIUM 8.6* 01/30/2015 0454   ALKPHOS 59 02/27/2015 1125   ALKPHOS 47 01/29/2015 0430   AST 18 02/27/2015 1125   AST 13*  01/29/2015 0430   ALT 9 02/27/2015 1125   ALT 9* 01/29/2015 0430   BILITOT 0.35 02/27/2015 1125   BILITOT 0.3 01/29/2015 0430      ASSESSMENT & PLAN:  Cancer of base of tongue ([place])  He has drastic improvement of his overall performance status. Clinically, none of the lymph nodes were palpable and I do not see any further ulcerated mass in his base of tongue/throat. I will order repeat PET CT scan in 3 months and see him then. If he continues to improve, we may be able to remove the feeding tube sooner. Patient was seen importance of close follow-up with the speech & language therapist, nutritionist as well as physical therapist.  S/P gastrostomy tube (G tube) placement, follow-up exam  His feeding tube is almost completely pulled out. We will fix it today. At that the patient is eating better, I continue to encourage him to eat as much as possible by mouth with the hope of removing the feeding tube in the near future.  Protein calorie malnutrition ([place])  His nutritional status has improved dramatically since he was last seen. As above, I encouraged him to increase oral intake as tolerated.  Dysarthria He has persistent dysarthria despite completion of treatment. He will continue close follow-up with speech and language therapist for speech and swallowing assessment.   Orders Placed This Encounter  Procedures  . NM PET Image Restag (PS) Skull Base To Thigh    Standing Status: Future     Number of Occurrences:      Standing Expiration Date: 04/28/2016    Order Specific Question:  Reason for Exam (SYMPTOM  OR DIAGNOSIS REQUIRED)    Answer:  tongue ca, assess response to Rx    Order Specific Question:  Preferred imaging location?    Answer:  [facility]   All questions were answered. The patient knows to call the clinic with any problems, questions or concerns. No barriers to learning was detected. I spent 25 minutes counseling the patient face to face. The total time  spent in the appointment was 30 minutes and more than 50% was on counseling and review of test results     [facility], [place], MD 02/27/2015 2:16 PM

## 2015-02-27 NOTE — Telephone Encounter (Signed)
Gave pt appt for 05/26/15 and advised radiology will call to make PET scan appt in March. Pt's daughter verbalized understanding.

## 2015-02-27 NOTE — Progress Notes (Signed)
  Oncology Nurse Navigator Documentation   Navigator Encounter Type: Clinic/MDC (02/27/15 1220) Patient Visit Type: Medonc;Follow-up (02/27/15 1220)     To provide support and encouragement, care continuity and to assess for needs, met with patient during established patient appt with Dr. Alvy Bimler.  Kenneth Jordan was accompanied by his dtr Mickel Baas.  Kenneth Jordan completed RT on 01/22/15.  Kenneth Jordan continues to reside at [place]. Kenneth Jordan/dtr reported:  Eating and drinking better, diet now includes regular food.  Drinking Ensure.  Being seen by speech therapist regularly, physical therapist M-F.  Unsure if PEG is being flushed.  New hearing aids expected first week in February. Kenneth Jordan registered a 1 lb weight gain since last visit.  Kenneth Jordan understands that PEG can be removed if Kenneth Jordan demonstrates stable weight over 2-3 week period with oral intake only. I adjusted PEG retention ring.  Subjectively, Kenneth Jordan was very clear of thought and expressed himself well, a notable improvement since my last encounter with him on 02/14/15. They understand I can be contacted with needs/concerns.  [name], RN, BSN, [place] at [place] [phone]                      Time Spent with Patient: 45 (02/27/15 1220)

## 2015-02-27 NOTE — Assessment & Plan Note (Signed)
His nutritional status has improved dramatically since he was last seen. As above, I encouraged him to increase oral intake as tolerated.

## 2015-02-27 NOTE — Assessment & Plan Note (Signed)
His feeding tube is almost completely pulled out. We will fix it today. At that the patient is eating better, I continue to encourage him to eat as much as possible by mouth with the hope of removing the feeding tube in the near future.

## 2015-02-27 NOTE — Assessment & Plan Note (Signed)
He has drastic improvement of his overall performance status. Clinically, none of the lymph nodes were palpable and I do not see any further ulcerated mass in his base of tongue/throat. I will order repeat PET CT scan in 3 months and see him then. If he continues to improve, we may be able to remove the feeding tube sooner. Patient was seen importance of close follow-up with the speech & language therapist, nutritionist as well as physical therapist.

## 2015-02-27 NOTE — Assessment & Plan Note (Signed)
He has persistent dysarthria despite completion of treatment. He will continue close follow-up with speech and language therapist for speech and swallowing assessment.

## 2015-03-04 ENCOUNTER — Ambulatory Visit (HOSPITAL_COMMUNITY): Payer: Self-pay | Admitting: Dentistry

## 2015-03-12 ENCOUNTER — Telehealth: Payer: Self-pay | Admitting: *Deleted

## 2015-03-12 NOTE — Telephone Encounter (Signed)
  Oncology Nurse Navigator Documentation Navigator Location: [facility] (03/12/15 1409) Navigator Encounter Type: Telephone (03/12/15 1409) Telephone: Outgoing Call;Patient Update (03/12/15 1409)                              Acuity: Level 1 (03/12/15 1409) Acuity Level 1: Minimal follow up required (03/12/15 1409)     Called [name], spoke with patient's RN Kenneth Jordan. She provided the following most recent weights for Kenneth Jordan:  02/04/15 155  02/11/15 160  02/18/15 157  02/25/15 159  03/04/15 156 She reported PEG tube use is minimal, used only for water; he is managing oral intake of a regular diet without difficulty. Information routed to Dr. Alvy Bimler.  [name], RN, BSN, [place] at [place] [phone]      Time Spent with Patient: 15 (03/12/15 1409)

## 2015-03-14 ENCOUNTER — Non-Acute Institutional Stay (SKILLED_NURSING_FACILITY): Payer: Medicare PPO | Admitting: Internal Medicine

## 2015-03-14 DIAGNOSIS — R195 Other fecal abnormalities: Secondary | ICD-10-CM

## 2015-03-14 DIAGNOSIS — S01101D Unspecified open wound of right eyelid and periocular area, subsequent encounter: Secondary | ICD-10-CM | POA: Diagnosis not present

## 2015-03-17 ENCOUNTER — Encounter: Payer: Self-pay | Admitting: Internal Medicine

## 2015-03-17 DIAGNOSIS — R195 Other fecal abnormalities: Secondary | ICD-10-CM | POA: Insufficient documentation

## 2015-03-17 DIAGNOSIS — S01109A Unspecified open wound of unspecified eyelid and periocular area, initial encounter: Secondary | ICD-10-CM | POA: Insufficient documentation

## 2015-03-17 NOTE — Progress Notes (Signed)
Patient ID: Kenneth Jordan, male   DOB: May 22, 1935, 80 y.o.   MRN: [id number]   Location:  [facility] Room: 409 Provider: Jeanmarie Hubert, M.D.  Code Status: DO NOT RESUSCITATE Goals of Care: Advanced Directive information Advanced Directives 02/14/2015  Does patient have an advance directive? Yes  Type of Paramedic of [place];Living will  Does patient want to make changes to advanced directive? -  Copy of advanced directive(s) in chart? No - copy requested  Would patient like information on creating an advanced directive? -     Chief Complaint  Patient presents with  . Acute Visit    Laceration right eyebrow on 03/07/2015    HPI: Patient is a 80 y.o. male seen for follow-up of an acute wound to the right eyebrow that was sutured following a fall with head trauma 03/07/2015. Patient denies headache or other injuries following this fall. Multiple sutures were placed in the right eyebrow area. This area is healing.  Patient has other complaints that he presents with. Complains of his stomach hurting. This is a nonspecific complaint. He thinks it is related to loose stools that he has been having. He would like to stop his Carafate.  Patient would like a bath. Apparently he has had some difficulty getting the staff to meet him following his injuries. There still evidence of clotted blood around the scalp and neck area.  Depression screen [facility] 2/9 02/14/2015 11/08/2014  Decreased Interest 0 0  Down, Depressed, Hopeless 0 0  PHQ - 2 Score 0 0    Fall Risk  02/14/2015 11/08/2014  Falls in the past year? Yes Yes  Number falls in past yr: 1 2 or more  Injury with Fall? Yes Yes  Risk Factor Category  - High Fall Risk  Risk for fall due to : History of fall(s) -  Follow up Falls evaluation completed -   No flowsheet data found.   Health Maintenance  Topic Date Due  . TETANUS/TDAP  07/11/1956  . ZOSTAVAX  07/11/1997  . PNA vac Low Risk Adult (1 of 2 - PCV13)  07/12/2002  . INFLUENZA VACCINE  09/30/2015    Review of Systems:  Review of Systems  Constitutional: Positive for weight loss and malaise/fatigue. Negative for fever, chills and diaphoresis.  HENT: Negative for congestion, ear pain, nosebleeds and sore throat.   Eyes: Negative for blurred vision, double vision, pain and discharge.  Respiratory: Negative for cough, hemoptysis, sputum production, shortness of breath and wheezing.   Cardiovascular: Negative for chest pain, palpitations, orthopnea, claudication and PND.  Gastrointestinal: Positive for heartburn. Negative for nausea, vomiting, abdominal pain, diarrhea, blood in stool and melena.       PEG tube in place. Having loose stools without frank diarrhea. Complains of abdominal discomfort diffusely.  Genitourinary: Negative for dysuria, urgency, frequency, hematuria and flank pain.  Musculoskeletal: Negative for myalgias, back pain, joint pain and neck pain.  Skin: Negative for itching and rash.  Neurological: Positive for speech change (History of lingual cancer) and weakness. Negative for dizziness, tremors, sensory change, focal weakness, seizures, loss of consciousness and headaches.  Endo/Heme/Allergies: Negative for environmental allergies and polydipsia. Does not bruise/bleed easily.  Psychiatric/Behavioral: Negative for depression, suicidal ideas, hallucinations, memory loss and substance abuse. The patient is not nervous/anxious and does not have insomnia.     Past Medical History  Diagnosis Date  . 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 Hypertension   . Retinitis  pigmentosa of both eyes   . Anxiety   . Depression   . GERD (gastroesophageal reflux disease)     hx of   . Arthritis     shoulders, right knee   . Insomnia 12/24/2014    Past Surgical History  Procedure Laterality Date  . Biopsy tongue  10/24/14  . Knee surgery Right   . Tonsilectomy, adenoidectomy, bilateral myringotomy and tubes    . Feedign  tube srugery     . Multiple extractions with alveoloplasty N/A 11/20/2014    Procedure: Extraction of tooth #'s 1,15,16,17,20,31 with alveoloplasty and gross debridement of remaining teeth;  Surgeon: Lenn Cal, DDS;  Location: [facility];  Service: Oral Surgery;  Laterality: N/A;    The patient has a family history of  shoulder  Allergies  Allergen Reactions  . Dilaudid [Hydromorphone Hcl] Other (See Comments)    Hallucinations  . Other Other (See Comments)    Seasonal allergies - sneezing, runny nose, watery eyes      Medication List       This list is accurate as of: 03/14/15 11:59 PM.  Always use your most recent med list.               acetaminophen 325 MG tablet  Commonly known as:  TYLENOL  Take 650 mg by mouth every 6 (six) hours as needed (For pain.).     feeding supplement (OSMOLITE 1.2 CAL) Liqd  Place 237 mLs into feeding tube every 4 (four) hours as needed (Hunger or per request.).     ENSURE PLUS Liqd  Take 1 Can by mouth 4 (four) times daily - after meals and at bedtime. OR Osmolyte 1.5 via tube     metoCLOPramide 10 MG tablet  Commonly known as:  REGLAN     valproic acid 250 MG/5ML syrup  Commonly known as:  DEPAKENE  Take 5 mLs (250 mg total) by mouth 2 (two) times daily.        Physical Exam: Filed Vitals:   03/17/15 1526  BP: 130/60  Pulse: 68  Temp: 98 F (36.7 C)  Resp: 19  Weight: 156 lb (70.761 kg)   Body mass index is 23.03 kg/(m^2). Physical Exam  Constitutional: He is oriented to person, place, and time. He appears well-developed. No distress.  Generalized weakness. PEG tube in place.  HENT:  Right Ear: External ear normal.  Left Ear: External ear normal.  Nose: Nose normal.  Eyes: EOM are normal. Pupils are equal, round, and reactive to light. Right eye exhibits no discharge. Left eye exhibits no discharge. No scleral icterus.  Neck: No JVD present. No tracheal deviation present. No thyromegaly present.  Cardiovascular:  Normal rate, regular rhythm, normal heart sounds and intact distal pulses.  Exam reveals no gallop and no friction rub.   No murmur heard. Pulmonary/Chest: No respiratory distress. He has no wheezes. He has no rales. He exhibits no tenderness.  Abdominal: He exhibits no distension and no mass. There is no tenderness. There is no rebound.  Genitourinary: Rectum normal, prostate normal and penis normal. Guaiac negative stool. No penile tenderness.  Musculoskeletal: Normal range of motion. He exhibits no edema or tenderness.  Lymphadenopathy:    He has no cervical adenopathy.  Neurological: He is oriented to person, place, and time. He displays normal reflexes. No cranial nerve deficit. He exhibits normal muscle tone. Coordination normal.  Skin: Skin is warm and dry. No rash noted. No erythema. No pallor.  Multiple sutures in  laceration of the right eyebrow area. No infection.  Psychiatric: He has a normal mood and affect. His behavior is normal. Judgment and thought content normal.    Labs reviewed: Basic Metabolic Panel:  Recent Labs  11/25/14 1101  12/16/14 1433 12/23/14 1502  01/06/15 1450  01/28/15 1544 01/29/15 0430 01/30/15 0454 02/27/15 1125  NA 140  < > 136 137  < > 138  < > 131* 135 136 135*  K 4.4  < > 4.7 4.8  < > 4.9  < > 4.8 4.3 4.1 4.5  CL  --   --   --   --   < >  --   --  97* 103 103  --   CO2 26  < > 26 27  < > 28  < > 27 25 26 26   GLUCOSE 87  < > 111 84  < > 94  < > 104* 95 86 82  BUN 20.1  < > 26.5* 25.0  < > 23.1  < > 31* 24* 15 12.6  CREATININE 1.2  < > 1.1 0.9  < > 0.8  < > 0.90 0.73 0.89 0.9  CALCIUM 9.8  < > 9.3 9.3  < > 9.6  < > 9.0 8.2* 8.6* 9.2  MG  --   < > 2.0 2.0  --  1.9  --   --   --   --   --   TSH 0.669  --   --   --   --   --   --   --   --   --   --   < > = values in this interval not displayed. Liver Function Tests:  Recent Labs  01/13/15 1557 01/29/15 0430 02/27/15 1125  AST 23 13* 18  ALT 16 9* 9  ALKPHOS 71 47 59  BILITOT <0.30 0.3  0.35  PROT 6.6 5.9* 6.5  ALBUMIN 2.6* 2.6* 3.0*   No results for input(s): LIPASE, AMYLASE in the last 8760 hours. No results for input(s): AMMONIA in the last 8760 hours. CBC:  Recent Labs  01/06/15 1450 01/13/15 1558  01/29/15 0430 01/30/15 0454 02/27/15 1125  WBC 8.6 9.2  < > 6.8 5.8 5.3  NEUTROABS 6.4 6.0  --   --   --  3.6  HGB 11.5* 10.7*  < > 9.2* 9.4* 12.1*  HCT 36.2* 34.2*  < > 30.5* 30.5* 38.8  MCV 76.4* 77.0*  < > 78.8 79.4 78.4*  PLT 535* 495*  < > 296 348 225  < > = values in this interval not displayed. Lipid Panel: No results for input(s): CHOL, HDL, LDLCALC, TRIG, CHOLHDL, LDLDIRECT in the last 8760 hours. No results found for: HGBA1C  Procedures: No results found.  Assessment/Plan 1. Open wound of eyebrow, right, subsequent encounter Remove sutures 03/17/2015  2. Loose stools -Discontinue Carafate -4 g cholestyramine powder and 2 ounces water daily. Get via PEG.

## 2015-03-20 ENCOUNTER — Telehealth: Payer: Self-pay | Admitting: *Deleted

## 2015-03-24 ENCOUNTER — Telehealth: Payer: Self-pay | Admitting: Hematology and Oncology

## 2015-03-24 ENCOUNTER — Encounter: Payer: Self-pay | Admitting: Internal Medicine

## 2015-03-24 ENCOUNTER — Other Ambulatory Visit: Payer: Self-pay | Admitting: Hematology and Oncology

## 2015-03-24 ENCOUNTER — Non-Acute Institutional Stay (SKILLED_NURSING_FACILITY): Payer: Medicare PPO | Admitting: Internal Medicine

## 2015-03-24 ENCOUNTER — Telehealth: Payer: Self-pay | Admitting: Adult Health

## 2015-03-24 DIAGNOSIS — H35349 Macular cyst, hole, or pseudohole, unspecified eye: Secondary | ICD-10-CM | POA: Diagnosis not present

## 2015-03-24 DIAGNOSIS — C01 Malignant neoplasm of base of tongue: Secondary | ICD-10-CM

## 2015-03-24 DIAGNOSIS — R634 Abnormal weight loss: Secondary | ICD-10-CM | POA: Diagnosis not present

## 2015-03-24 DIAGNOSIS — R1310 Dysphagia, unspecified: Secondary | ICD-10-CM

## 2015-03-24 NOTE — Progress Notes (Signed)
Patient ID: Kenneth Jordan, male   DOB: 01-16-1936, 80 y.o.   MRN: [id number]    [place] Room Number: [place] of Service: SNF (31)     Allergies  Allergen Reactions  . Dilaudid [Hydromorphone Hcl] Other (See Comments)    Hallucinations  . Other Other (See Comments)    Seasonal allergies - sneezing, runny nose, watery eyes    Chief Complaint  Patient presents with  . Medical Management of Chronic Issues    HPI:  Abdomen patient is seen today for follow-up of a recent visit with the optometrist. 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  Patient recently had traumatic head injury which caused a laceration of the right eyebrow. Sutures have been removed and the area is healing. Because of the change in mental alertness, CT of the brain is warranted for this problem.  Patient's daughter approached me today and requested information regarding the patient's medications. She doesn't understand why he is on valproic acid. She does understand about the Remeron being in appetite enhancer in some people. Patient has lost a considerable amount of weight over the last year. She claims that it amounts to at least 80 pounds.  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  Patient has protein calorie insufficiency. He was on tube feedings with supplements. Last fall tube feedings were increased from twice daily to 3 times daily and then cut back to once daily in December 2016. Oral intake has not Pace with his nutritional needs.  Medications: Patient's Medications  New Prescriptions   No medications on file  Previous Medications   ACETAMINOPHEN (TYLENOL) 325 MG TABLET    Take 650 mg by mouth every 6 (six) hours as needed (For pain.).   ALBUTEROL (PROVENTIL) (5 MG/ML) 0.5% NEBULIZER SOLUTION    Take 2.5 mg by nebulization  every 6 (six) hours as needed for wheezing or shortness of breath.   CLINDAMYCIN (CLINDAGEL) 1 % GEL    Apply 1 application topically 2 (two) times daily.   ENSURE PLUS (ENSURE PLUS) LIQD    Take 1 Can by mouth 4 (four) times daily - after meals and at bedtime. OR Osmolyte 1.5 via tube   HYDROCORTISONE 1 % OINTMENT    Apply 1 application topically 2 (two) times daily.   LIDOCAINE 2%, 20 MG/ML, 5 ML SYRINGE    Mix 1 part 2% to water, swish and swallow 10 mls 30 mins before meals @@ HS   LORATADINE (CLARITIN) 5 MG/5ML SYRUP    Place 10 mg into feeding tube daily as needed for allergies.   MENTHOL, TOPICAL ANALGESIC, 10 % GEL    Apply topically. Apply topically to shoulder every 6 hours for shoulder pain.   METOCLOPRAMIDE (REGLAN) 10 MG TABLET       NUTRITIONAL SUPPLEMENTS (FEEDING SUPPLEMENT, OSMOLITE 1.2 CAL,) LIQD    Place 237 mLs into feeding tube every 4 (four) hours as needed (Hunger or per request.).  Modified Medications   No medications on file  Discontinued Medications   SUCRALFATE (CARAFATE) 1 GM/10ML SUSPENSION    Take 1 g by mouth 4 (four) times daily. With meals   VALPROIC ACID (DEPAKENE) 250 MG/5ML SYRUP    Take 5 mLs (250 mg total) by mouth 2 (two) times daily.     Review of Systems  Constitutional: Negative for fever, chills and diaphoresis.  HENT: Negative for congestion, ear pain, nosebleeds and sore throat.   Eyes: Negative for pain and discharge.  Respiratory: Negative for cough, shortness of breath and wheezing.   Cardiovascular: Negative for chest pain and palpitations.  Gastrointestinal: Negative for nausea, vomiting, abdominal pain, diarrhea and blood in stool.       PEG tube in place. Having loose stools without frank diarrhea. Complains of abdominal discomfort diffusely.  Endocrine: Negative for polydipsia.  Genitourinary: Negative for dysuria, urgency, frequency, hematuria and flank pain.  Musculoskeletal: Negative for myalgias, back pain and neck pain.  Skin:  Negative for rash.  Allergic/Immunologic: Negative for environmental allergies.  Neurological: Positive for weakness. Negative for dizziness, tremors, seizures and headaches.  Hematological: Does not bruise/bleed easily.  Psychiatric/Behavioral: Negative for suicidal ideas and hallucinations. The patient is not nervous/anxious.     Filed Vitals:   03/24/15 1509  BP: 145/66  Pulse: 67  Temp: 97.6 F (36.4 C)  Resp: 19   Wt Readings from Last 3 Encounters:  03/17/15 156 lb (70.761 kg)  02/27/15 159 lb 8 oz (72.349 kg)  02/14/15 159 lb 11.2 oz (72.439 kg)    There is no weight on file to calculate BMI.  Physical Exam  Constitutional: He is oriented to person, place, and time. He appears well-developed. No distress.  Generalized weakness. PEG tube in place.  HENT:  Right Ear: External ear normal.  Left Ear: External ear normal.  Nose: Nose normal.  Eyes: EOM are normal. Pupils are equal, round, and reactive to light. Right eye exhibits no discharge. Left eye exhibits no discharge. No scleral icterus.  Neck: No JVD present. No tracheal deviation present. No thyromegaly present.  Cardiovascular: Normal rate, regular rhythm, normal heart sounds and intact distal pulses.  Exam reveals no gallop and no friction rub.   No murmur heard. Pulmonary/Chest: No respiratory distress. He has no wheezes. He has no rales. He exhibits no tenderness.  Abdominal: He exhibits no distension and no mass. There is no tenderness. There is no rebound.  Genitourinary: Rectum normal, prostate normal and penis normal. Guaiac negative stool. No penile tenderness.  Musculoskeletal: Normal range of motion. He exhibits no edema or tenderness.  Lymphadenopathy:    He has no cervical adenopathy.  Neurological: He is oriented to person, place, and time. He displays normal reflexes. No cranial nerve deficit. He exhibits normal muscle tone. Coordination normal.  Skin: Skin is warm and dry. No rash noted. No  erythema. No pallor.  Psychiatric: He has a normal mood and affect. His behavior is normal. Judgment and thought content normal.     Labs reviewed: Lab Summary Latest Ref Rng 02/27/2015 01/30/2015 01/29/2015  Hemoglobin 13.0 - 17.1 g/dL 12.1(L) 9.4(L) 9.2(L)  Hematocrit 38.4 - 49.9 % 38.8 30.5(L) 30.5(L)  White count 4.0 - 10.3 10e3/uL 5.3 5.8 6.8  Platelet count 140 - 400 10e3/uL 225 348 296  Sodium 136 - 145 mEq/L 135(L) 136 135  Potassium 3.5 - 5.1 mEq/L 4.5 4.1 4.3  Calcium 8.4 - 10.4 mg/dL 9.2 8.6(L) 8.2(L)  Phosphorus - (None) (None) (None)  Creatinine 0.7 - 1.3 mg/dL 0.9 0.89 0.73  AST 5 - 34 U/L 18 (None) 13(L)  Alk Phos 40 - 150 U/L 59 (None) 47  Bilirubin 0.20 - 1.20 mg/dL 0.35 (None) 0.3  Glucose 70 - 140 mg/dl 82 86 95  Cholesterol - (None) (None) (None)  HDL cholesterol - (None) (None) (None)  Triglycerides - (None) (None) (  None)  LDL Direct - (None) (None) (None)  LDL Calc - (None) (None) (None)  Total protein 6.4 - 8.3 g/dL 6.5 (None) 5.9(L)  Albumin 3.5 - 5.0 g/dL 3.0(L) (None) 2.6(L)   Lab Results  Component Value Date   TSH 0.669 11/25/2014   Lab Results  Component Value Date   BUN 12.6 02/27/2015   BUN 15 01/30/2015   BUN 24* 01/29/2015   Lab Results  Component Value Date   CREATININE 0.9 02/27/2015   CREATININE 0.89 01/30/2015   CREATININE 0.73 01/29/2015   No results found for: HGBA1C     Assessment/Plan  1. Weight loss Patient says that the food tastes terrible to him. He is unwilling to try to eat more. I will increase his Osmolite to twice daily. I'm not sure what valproic acid is doing for this patient. It is discontinued with the consent of both the patient and his daughter.  2. Cancer of base of tongue ([place]) Remains under oncologic follow-up  3. Macular hole, unspecified laterality CT brain scan ordered  4. Dysphagia Patient has full range of choices in his diet right now.  This visit lasted approximately 30 minutes with  counseling of the patient and answering his daughter's questions.

## 2015-03-24 NOTE — Telephone Encounter (Signed)
I spoke with the patient's daughter, [name], to get an update on how her father is doing.  She let me know that she is currently struggling with some staff changes at the SNF [facility]) and has some concerns re: his safety/recent fall and her dad's condition.  She states that they have switched physicians and she hopes to meet with the "new doctor" today to get an update.    Her biggest expressed concerns were:  -recent fall requiring stitches above his eye -seen by ophthalmologist at SNF who thought pt may have a "hole behind his eye" and wanted him to be evaluated further in the office -pt was dropped from therapy d/t insurance; Medicare is supposed to "pick him back up" soon, she hopes.   -new speech therapist at SNF now; unsure if they are working with Mr. Persinger as much as previous staff was -they are waiting on Medicaid approval.  -is staff supplementing diet with Ensure supplements as suggested by Dr. Alvy Bimler?  -was a psychiatric evaluation done at the SNF because she feels that his mental status waxes & wanes and needs further evaluation. She isn't sure if this psch eval has happened.    I offered support via phone through acknowledgment, active listening, and validation of her concerns.  [name] would like to talk with our social worker, [name], LCSW, about some of her concerns.  I let her know that I would make Lauren aware and have her give her a call.  [name] tells me that she has been attending a caregiver support group at her church, which has been helpful for her.  I offered to help facilitate a visit with our palliative care NP, Wadie Lessen, to help address QOL/symptom management concerns for the patient.  [name] remembers meeting Stanton Kidney in the hospital.  I let [name] know that given the patient's improvement, this conversation would likely help address his symptoms and how best to optimize his quality of life, and less focused on end-of-life discussions that may have been part of their  meeting when Mr. Maslo was hospitalized and not doing as well.  I let her know that often palliative care can help facilitate additional resources, if appropriate.  [name] stated that she would talk with her sister and let me know if she decided to come in to meet with palliative care.   [name]'s plan is to try to meet with the new attending MD at [facility] today and get some of her questions addressed.  I encouraged her to let him know her expectations and how they can work together to provide the best care for her dad.  She stated "I have to do what I can to advocate for my dad."  I acknowledged her commitment to advocating for her father and encouraged her to allow [place] to help advocate for him as well.  I gave her my direct office number and encouraged her to call me if there was anything we can to do help from an oncology/treatment side effect point of view.  She voiced understanding and appreciation for my call.   Mike Craze, NP [place] [phone]

## 2015-03-24 NOTE — Telephone Encounter (Signed)
  Oncology Nurse Navigator Documentation Navigator Location: [facility] (03/20/15 1140) Navigator Encounter Type: Telephone (03/20/15 1140) Telephone: [facility] Call (03/20/15 1140)       Called [facility], spoke with RN Alyse Low to obtain update on Kenneth Jordan.  She stated:  He is eating regular diet.  75-100% of nutritional intake is oral.  He receives nutritional supplement via PEG BID.  He has no recorded weight since the previously reported 156 lbs for 03/04/15.  She stated he is now on a monthly weight protocol. I noted a 03/14/15 note by Dr. Nyoka Cowden indicated a weight loss and I asked if she could provide clarification.  She was unable. I informed that Dr. Alvy Bimler wishes to proceed with PEG removal if appropriate.  I asked Alyse Low to leave note to this effect for Dr. Nyoka Cowden asking for his input.  She agreed to do so. She called later to inform that Dr. Nyoka Cowden wishes to keep PEG at this time. I informed Drs. Ann Lions, Survivorship NP Mike Craze.  [name], RN, BSN, [facility] [place] [phone]                           Acuity: Level 2 (03/20/15 1140)   Acuity Level 2: Assistance expediting appointments;Survivorship;Ongoing guidance and education throughout treatment as needed (03/20/15 1140)     Time Spent with Patient: 30 (03/20/15 1140)

## 2015-03-24 NOTE — Telephone Encounter (Signed)
per pof to sch pt appt-cld pt and left message of time & date of appt °

## 2015-03-24 NOTE — Progress Notes (Deleted)
Location:  [facility] Provider: Jeanmarie Hubert MD No primary care provider on file.  Code Status:  *** Goals of care: Advanced Directive information Advanced Directives 03/24/2015  Does patient have an advance directive? Yes  Type of Advance Directive Out of facility DNR (pink MOST or yellow form)  Does patient want to make changes to advanced directive? -  Copy of advanced directive(s) in chart? No - copy requested  Would patient like information on creating an advanced directive? -  Pre-existing out of facility DNR order (yellow form or pink MOST form) Yellow form placed in chart (order not valid for inpatient use)     Chief Complaint  Patient presents with  . Medical Management of Chronic Issues    HPI:  Pt is a 80 y.o. male seen today for medical management of chronic diseases.    ROS  Past Medical History  Diagnosis Date  . 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 Hypertension   . Retinitis pigmentosa of both eyes   . Anxiety   . Depression   . GERD (gastroesophageal reflux disease)     hx of   . Arthritis     shoulders, right knee   . Insomnia 12/24/2014   Past Surgical History  Procedure Laterality Date  . Biopsy tongue  10/24/14  . Knee surgery Right   . Tonsilectomy, adenoidectomy, bilateral myringotomy and tubes    . Feedign tube srugery     . Multiple extractions with alveoloplasty N/A 11/20/2014    Procedure: Extraction of tooth #'s 1,15,16,17,20,31 with alveoloplasty and gross debridement of remaining teeth;  Surgeon: Lenn Cal, DDS;  Location: [facility];  Service: Oral Surgery;  Laterality: N/A;    Allergies  Allergen Reactions  . Dilaudid [Hydromorphone Hcl] Other (See Comments)    Hallucinations  . Other Other (See Comments)    Seasonal allergies - sneezing, runny nose, watery eyes      Medication List       This list is accurate as of: 03/24/15  3:34 PM.  Always use your most recent med list.               acetaminophen 325  MG tablet  Commonly known as:  TYLENOL  Take 650 mg by mouth every 6 (six) hours as needed (For pain.).     albuterol (5 MG/ML) 0.5% nebulizer solution  Commonly known as:  PROVENTIL  Take 2.5 mg by nebulization every 6 (six) hours as needed for wheezing or shortness of breath.     clindamycin 1 % gel  Commonly known as:  CLINDAGEL  Apply 1 application topically 2 (two) times daily.     feeding supplement (OSMOLITE 1.2 CAL) Liqd  Place 237 mLs into feeding tube every 4 (four) hours as needed (Hunger or per request.).     ENSURE PLUS Liqd  Take 1 Can by mouth 4 (four) times daily - after meals and at bedtime. OR Osmolyte 1.5 via tube     hydrocortisone 1 % ointment  Apply 1 application topically 2 (two) times daily.     LIDOCAINE 2% (20 MG/ML) 5 ML SYRINGE  Mix 1 part 2% to water, swish and swallow 10 mls 30 mins before meals @@ HS     loratadine 5 MG/5ML syrup  Commonly known as:  CLARITIN  Place 10 mg into feeding tube daily as needed for allergies.     Menthol (Topical Analgesic) 10 % Gel  Apply topically. Apply  topically to shoulder every 6 hours for shoulder pain.     metoCLOPramide 10 MG tablet  Commonly known as:  REGLAN        Immunization History  Administered Date(s) Administered  . Influenza,inj,Quad PF,36+ Mos 11/08/2014   Pertinent  Health Maintenance Due  Topic Date Due  . PNA vac Low Risk Adult (1 of 2 - PCV13) 07/12/2002  . INFLUENZA VACCINE  09/30/2015   Fall Risk  02/14/2015 11/08/2014  Falls in the past year? Yes Yes  Number falls in past yr: 1 2 or more  Injury with Fall? Yes Yes  Risk Factor Category  - High Fall Risk  Risk for fall due to : History of fall(s) -  Follow up Falls evaluation completed -    Filed Vitals:   03/24/15 1509  BP: 145/66  Pulse: 67  Temp: 97.6 F (36.4 C)  Resp: 19   There is no weight on file to calculate BMI. Physical Exam  Labs reviewed:  Recent Labs  12/16/14 1433 12/23/14 1502  01/06/15 1450   01/28/15 1544 01/29/15 0430 01/30/15 0454 02/27/15 1125  NA 136 137  < > 138  < > 131* 135 136 135*  K 4.7 4.8  < > 4.9  < > 4.8 4.3 4.1 4.5  CL  --   --   < >  --   --  97* 103 103  --   CO2 26 27  < > 28  < > 27 25 26 26   GLUCOSE 111 84  < > 94  < > 104* 95 86 82  BUN 26.5* 25.0  < > 23.1  < > 31* 24* 15 12.6  CREATININE 1.1 0.9  < > 0.8  < > 0.90 0.73 0.89 0.9  CALCIUM 9.3 9.3  < > 9.6  < > 9.0 8.2* 8.6* 9.2  MG 2.0 2.0  --  1.9  --   --   --   --   --   < > = values in this interval not displayed.  Recent Labs  01/13/15 1557 01/29/15 0430 02/27/15 1125  AST 23 13* 18  ALT 16 9* 9  ALKPHOS 71 47 59  BILITOT <0.30 0.3 0.35  PROT 6.6 5.9* 6.5  ALBUMIN 2.6* 2.6* 3.0*    Recent Labs  01/06/15 1450 01/13/15 1558  01/29/15 0430 01/30/15 0454 02/27/15 1125  WBC 8.6 9.2  < > 6.8 5.8 5.3  NEUTROABS 6.4 6.0  --   --   --  3.6  HGB 11.5* 10.7*  < > 9.2* 9.4* 12.1*  HCT 36.2* 34.2*  < > 30.5* 30.5* 38.8  MCV 76.4* 77.0*  < > 78.8 79.4 78.4*  PLT 535* 495*  < > 296 348 225  < > = values in this interval not displayed. Lab Results  Component Value Date   TSH 0.669 11/25/2014   No results found for: HGBA1C No results found for: CHOL, HDL, LDLCALC, LDLDIRECT, TRIG, CHOLHDL  Significant Diagnostic Results in last 30 days:  No results found.  Assessment/Plan There are no diagnoses linked to this encounter.

## 2015-03-25 ENCOUNTER — Telehealth: Payer: Self-pay | Admitting: Adult Health

## 2015-03-25 NOTE — Telephone Encounter (Signed)
I received a call from Eddie Dibbles, Mr. Fullbright daughter this morning with several concerns re: her dad.  I have left a message with [name], LCSW for her to help facilitate some of these concerns and offer suggestions/guidance for the family.  I have also spoken with Dory Peru, RD and Dr. Alvy Bimler re: this patient.  Awaiting recommendations from social work.   Mike Craze, NP [place] [phone]

## 2015-03-28 ENCOUNTER — Telehealth: Payer: Self-pay | Admitting: Nutrition

## 2015-03-28 NOTE — Telephone Encounter (Signed)
[facility] regarding patient.   Spoke with staff member who says they have a "dietitian" by the name of Megan Hunton. Jinny Blossom has left for the day but will return on Monday. I will try to call her back for assistance with this patient's nutritional status.

## 2015-03-31 ENCOUNTER — Encounter: Payer: Self-pay | Admitting: Internal Medicine

## 2015-03-31 ENCOUNTER — Encounter: Payer: Self-pay | Admitting: *Deleted

## 2015-03-31 ENCOUNTER — Non-Acute Institutional Stay (SKILLED_NURSING_FACILITY): Payer: Medicare PPO | Admitting: Internal Medicine

## 2015-03-31 DIAGNOSIS — S01101D Unspecified open wound of right eyelid and periocular area, subsequent encounter: Secondary | ICD-10-CM | POA: Diagnosis not present

## 2015-03-31 DIAGNOSIS — L929 Granulomatous disorder of the skin and subcutaneous tissue, unspecified: Secondary | ICD-10-CM | POA: Diagnosis not present

## 2015-03-31 DIAGNOSIS — R634 Abnormal weight loss: Secondary | ICD-10-CM

## 2015-03-31 NOTE — Progress Notes (Signed)
[place] Work  Clinical Social Work has received referral from Mike Craze, NP, and received call from patient's daughter, Jenean Lindau.  CSW made multiple attempts to discuss patient's status at [place] with [place] CSW.  CSW left voicemail with [facility] SW Donnald Garre.    [name], MSW, LCSW, OSW-C Clinical Social Worker [facility] [phone]

## 2015-03-31 NOTE — Progress Notes (Signed)
[place] Room Number: [place] of Service: SNF (31)     Allergies  Allergen Reactions  . Dilaudid [Hydromorphone Hcl] Other (See Comments)    Hallucinations  . Other Other (See Comments)    Seasonal allergies - sneezing, runny nose, watery eyes    Chief Complaint  Patient presents with  . Acute Visit    HPI:  Granuloma, skin - no assesses the patient acutely for an area at his PEG tube insertion that had been bleeding on 03/29/2015. Serosanguineous fluid was described by staff. On examination today, there is an area of granulomatous tissue that is fragile but not bleeding.  Open wound of eyebrow, right, subsequent encounter - fully healed  Weight loss - patient says he is unable to choose foods that he likes off of the provided menus. He doesn't think he would be able to handle more solid foods because of swallowing difficulties. He says that food tastes bad with a mixture of things that he is getting on the pured diet.  Medicaications: Patient's Medications  New Prescriptions   No medications on file  Previous Medications   ACETAMINOPHEN (TYLENOL) 325 MG TABLET    Take 650 mg by mouth every 6 (six) hours as needed (For pain.).   ALBUTEROL (PROVENTIL) (5 MG/ML) 0.5% NEBULIZER SOLUTION    Take 2.5 mg by nebulization every 6 (six) hours as needed for wheezing or shortness of breath.   CLINDAMYCIN (CLINDAGEL) 1 % GEL    Apply 1 application topically 2 (two) times daily.   ENSURE PLUS (ENSURE PLUS) LIQD    Take 1 Can by mouth 4 (four) times daily - after meals and at bedtime. OR Osmolyte 1.5 via tube   HYDROCORTISONE 1 % OINTMENT    Apply 1 application topically 2 (two) times daily.   LIDOCAINE 2%, 20 MG/ML, 5 ML SYRINGE    Mix 1 part 2% to water, swish and swallow 10 mls 30 mins before meals @@ HS   LORATADINE (CLARITIN) 5 MG/5ML SYRUP    Place 10 mg into feeding tube daily as needed for allergies.   MENTHOL, TOPICAL ANALGESIC, 10  % GEL    Apply topically. Apply topically to shoulder every 6 hours for shoulder pain.   METOCLOPRAMIDE (REGLAN) 10 MG TABLET       NUTRITIONAL SUPPLEMENTS (FEEDING SUPPLEMENT, OSMOLITE 1.2 CAL,) LIQD    Place 237 mLs into feeding tube every 4 (four) hours as needed (Hunger or per request.).   SUCRALFATE (CARAFATE) 1 GM/10ML SUSPENSION    Take 1 g by mouth 4 (four) times daily. With meals  Modified Medications   No medications on file  Discontinued Medications   No medications on file     Review of Systems  Constitutional: Negative for fever, chills and diaphoresis.  HENT: Negative for congestion, ear pain, nosebleeds and sore throat.   Eyes: Negative for pain and discharge.  Respiratory: Negative for cough, shortness of breath and wheezing.   Cardiovascular: Negative for chest pain and palpitations.  Gastrointestinal: Negative for nausea, vomiting, abdominal pain, diarrhea and blood in stool.       PEG tube in place. Having loose stools without frank diarrhea. Complains of abdominal discomfort diffusely.  Endocrine: Negative for polydipsia.  Genitourinary: Negative for dysuria, urgency, frequency, hematuria and flank pain.  Musculoskeletal: Negative for myalgias, back pain and neck pain.  Skin: Negative for rash.  Allergic/Immunologic: Negative for environmental allergies.  Neurological: Positive for weakness. Negative  for dizziness, tremors, seizures and headaches.  Hematological: Does not bruise/bleed easily.  Psychiatric/Behavioral: Negative for suicidal ideas and hallucinations. The patient is not nervous/anxious.     Filed Vitals:   03/31/15 1251  BP: 128/66  Pulse: 60  Temp: 97.4 F (36.3 C)  TempSrc: Oral  Resp: 18   Wt Readings from Last 3 Encounters:  03/17/15 156 lb (70.761 kg)  02/27/15 159 lb 8 oz (72.349 kg)  02/14/15 159 lb 11.2 oz (72.439 kg)    There is no weight on file to calculate BMI.  Physical Exam  Constitutional: He is oriented to person,  place, and time. He appears well-developed. No distress.  Generalized weakness. PEG tube in place.  HENT:  Right Ear: External ear normal.  Left Ear: External ear normal.  Nose: Nose normal.  Eyes: EOM are normal. Pupils are equal, round, and reactive to light. Right eye exhibits no discharge. Left eye exhibits no discharge. No scleral icterus.  Neck: No JVD present. No tracheal deviation present. No thyromegaly present.  Cardiovascular: Normal rate, regular rhythm, normal heart sounds and intact distal pulses.  Exam reveals no gallop and no friction rub.   No murmur heard. Pulmonary/Chest: No respiratory distress. He has no wheezes. He has no rales. He exhibits no tenderness.  Abdominal: He exhibits no distension and no mass. There is no tenderness. There is no rebound.  Genitourinary: Rectum normal, prostate normal and penis normal. Guaiac negative stool. No penile tenderness.  Musculoskeletal: Normal range of motion. He exhibits no edema or tenderness.  Lymphadenopathy:    He has no cervical adenopathy.  Neurological: He is oriented to person, place, and time. He displays normal reflexes. No cranial nerve deficit. He exhibits normal muscle tone. Coordination normal.  Skin: Skin is warm and dry. No rash noted. No erythema. No pallor.  Small amount of proud flesh (granulomatous tissue) tube insertion site. Tissue is friable in this area and although not bleeding today, he could certainly bleed at times in the future.  Psychiatric: He has a normal mood and affect. His behavior is normal. Judgment and thought content normal.     Labs reviewed: Lab Summary Latest Ref Rng 02/27/2015 01/30/2015 01/29/2015  Hemoglobin 13.0 - 17.1 g/dL 12.1(L) 9.4(L) 9.2(L)  Hematocrit 38.4 - 49.9 % 38.8 30.5(L) 30.5(L)  White count 4.0 - 10.3 10e3/uL 5.3 5.8 6.8  Platelet count 140 - 400 10e3/uL 225 348 296  Sodium 136 - 145 mEq/L 135(L) 136 135  Potassium 3.5 - 5.1 mEq/L 4.5 4.1 4.3  Calcium 8.4 - 10.4  mg/dL 9.2 8.6(L) 8.2(L)  Phosphorus - (None) (None) (None)  Creatinine 0.7 - 1.3 mg/dL 0.9 0.89 0.73  AST 5 - 34 U/L 18 (None) 13(L)  Alk Phos 40 - 150 U/L 59 (None) 47  Bilirubin 0.20 - 1.20 mg/dL 0.35 (None) 0.3  Glucose 70 - 140 mg/dl 82 86 95  Cholesterol - (None) (None) (None)  HDL cholesterol - (None) (None) (None)  Triglycerides - (None) (None) (None)  LDL Direct - (None) (None) (None)  LDL Calc - (None) (None) (None)  Total protein 6.4 - 8.3 g/dL 6.5 (None) 5.9(L)  Albumin 3.5 - 5.0 g/dL 3.0(L) (None) 2.6(L)   Lab Results  Component Value Date   TSH 0.669 11/25/2014   Lab Results  Component Value Date   BUN 12.6 02/27/2015   BUN 15 01/30/2015   BUN 24* 01/29/2015   Lab Results  Component Value Date   CREATININE 0.9 02/27/2015   CREATININE 0.89 01/30/2015  CREATININE 0.73 01/29/2015   No results found for: HGBA1C     Assessment/Plan  1. Granuloma, skin If this bleeds in the future could be cauterized with silver nitrite.  2. Open wound of eyebrow, right, subsequent encounter Fully healed  3. Weight loss Dietary consult

## 2015-04-01 ENCOUNTER — Telehealth: Payer: Self-pay | Admitting: Nutrition

## 2015-04-01 ENCOUNTER — Encounter: Payer: Self-pay | Admitting: Nutrition

## 2015-04-01 NOTE — Telephone Encounter (Signed)
See progress note.

## 2015-04-01 NOTE — Progress Notes (Signed)
I contacted [facility] and spoke with Jinny Blossom who is the Designer, jewellery the facility. Jinny Blossom reports that the registered dietitian, Geanie Berlin, should be in on Thursday of this week. Jinny Blossom is familiar with Mr. Schrotenboer and reports he is receiving 3 meals a day but very small portions. Original tube feeding orders recommended by registered dietitian was Nutren 1.5 - 5 cans a day however, M.D. has decreased bolus feeding to twice a day. Jinny Blossom states patient is actually eating very little of his meals despite efforts in providing food choices. Patient is scheduled for daily weight checks. I asked Jinny Blossom to have dietitian contact me when she comes in on Thursday for further information on patient tube feeding.

## 2015-04-03 ENCOUNTER — Non-Acute Institutional Stay (SKILLED_NURSING_FACILITY): Payer: Medicare PPO | Admitting: Internal Medicine

## 2015-04-03 DIAGNOSIS — M79675 Pain in left toe(s): Secondary | ICD-10-CM

## 2015-04-03 DIAGNOSIS — M79676 Pain in unspecified toe(s): Secondary | ICD-10-CM | POA: Insufficient documentation

## 2015-04-03 NOTE — Progress Notes (Signed)
Patient ID: [facility], male   DOB: 01/21/1936, 80 y.o.   MRN: [id number]    [place] Room Number: 876  Place of Service: SNF (31)     Allergies  Allergen Reactions  . Dilaudid [Hydromorphone Hcl] Other (See Comments)    Hallucinations  . Other Other (See Comments)    Seasonal allergies - sneezing, runny nose, watery eyes    Chief Complaint  Patient presents with  . Acute Visit    Painful left first MTP joint    HPI:  I was asked to see patient acutely for painful left great toe. I'm unable to get a satisfactory history to date the onset of this problem. No known history of gout.  Medications: Patient's Medications  New Prescriptions   No medications on file  Previous Medications   ACETAMINOPHEN (TYLENOL) 325 MG TABLET    Take 650 mg by mouth every 6 (six) hours as needed (For pain.).   ALBUTEROL (PROVENTIL) (5 MG/ML) 0.5% NEBULIZER SOLUTION    Take 2.5 mg by nebulization every 6 (six) hours as needed for wheezing or shortness of breath.   CLINDAMYCIN (CLINDAGEL) 1 % GEL    Apply 1 application topically 2 (two) times daily.   ENSURE PLUS (ENSURE PLUS) LIQD    Take 1 Can by mouth 4 (four) times daily - after meals and at bedtime. OR Osmolyte 1.5 via tube   HYDROCORTISONE 1 % OINTMENT    Apply 1 application topically 2 (two) times daily.   LIDOCAINE 2%, 20 MG/ML, 5 ML SYRINGE    Mix 1 part 2% to water, swish and swallow 10 mls 30 mins before meals @@ HS   LORATADINE (CLARITIN) 5 MG/5ML SYRUP    Place 10 mg into feeding tube daily as needed for allergies.   MENTHOL, TOPICAL ANALGESIC, 10 % GEL    Apply topically. Apply topically to shoulder every 6 hours for shoulder pain.   METOCLOPRAMIDE (REGLAN) 10 MG TABLET       NUTRITIONAL SUPPLEMENTS (FEEDING SUPPLEMENT, OSMOLITE 1.2 CAL,) LIQD    Place 237 mLs into feeding tube every 4 (four) hours as needed (Hunger or per request.).   SUCRALFATE (CARAFATE) 1 GM/10ML SUSPENSION    Take 1 g by  mouth 4 (four) times daily. With meals  Modified Medications   No medications on file  Discontinued Medications   No medications on file     Review of Systems  Constitutional: Negative for fever, chills and diaphoresis.  HENT: Negative for congestion, ear pain, nosebleeds and sore throat.   Eyes: Negative for pain and discharge.  Respiratory: Negative for cough, shortness of breath and wheezing.   Cardiovascular: Negative for chest pain and palpitations.  Gastrointestinal: Negative for nausea, vomiting, abdominal pain, diarrhea and blood in stool.       PEG tube in place. Having loose stools without frank diarrhea. Complains of abdominal discomfort diffusely.  Endocrine: Negative for polydipsia.  Genitourinary: Negative for dysuria, urgency, frequency, hematuria and flank pain.  Musculoskeletal: Negative for myalgias, back pain and neck pain.       Pain left great toe at MTP joint  Skin: Negative for rash.  Allergic/Immunologic: Negative for environmental allergies.  Neurological: Positive for weakness. Negative for dizziness, tremors, seizures and headaches.  Hematological: Does not bruise/bleed easily.  Psychiatric/Behavioral: Negative for suicidal ideas and hallucinations. The patient is not nervous/anxious.     Filed Vitals:   04/03/15 1211  BP: 132/70  Pulse: 84  Temp: 98.3 F (36.8 C)  Resp: 20  Height: '5\' 9"'$  (1.753 m)  Weight: 148 lb (67.132 kg)   Wt Readings from Last 3 Encounters:  04/03/15 148 lb (67.132 kg)  03/17/15 156 lb (70.761 kg)  02/27/15 159 lb 8 oz (72.349 kg)    Body mass index is 21.85 kg/(m^2).  Physical Exam  Constitutional: He is oriented to person, place, and time. He appears well-developed. No distress.  Generalized weakness. PEG tube in place.  HENT:  Right Ear: External ear normal.  Left Ear: External ear normal.  Nose: Nose normal.  Eyes: EOM are normal. Pupils are equal, round, and reactive to light. Right eye exhibits no discharge.  Left eye exhibits no discharge. No scleral icterus.  Neck: No JVD present. No tracheal deviation present. No thyromegaly present.  Cardiovascular: Normal rate, regular rhythm, normal heart sounds and intact distal pulses.  Exam reveals no gallop and no friction rub.   No murmur heard. Pulmonary/Chest: No respiratory distress. He has no wheezes. He has no rales. He exhibits no tenderness.  Abdominal: He exhibits no distension and no mass. There is no tenderness. There is no rebound.  Genitourinary: Rectum normal, prostate normal and penis normal. Guaiac negative stool. No penile tenderness.  Musculoskeletal: Normal range of motion. He exhibits no edema or tenderness.  Pain left first MTP joint with inflammation and exquisite tenderness on palpation.  Lymphadenopathy:    He has no cervical adenopathy.  Neurological: He is oriented to person, place, and time. He displays normal reflexes. No cranial nerve deficit. He exhibits normal muscle tone. Coordination normal.  Skin: Skin is warm and dry. No rash noted. No erythema. No pallor.  Small amount of proud flesh (granulomatous tissue) tube insertion site. Tissue is friable in this area and although not bleeding today, he could certainly bleed at times in the future.  Psychiatric: He has a normal mood and affect. His behavior is normal. Judgment and thought content normal.     Labs reviewed: Lab Summary Latest Ref Rng 02/27/2015 01/30/2015 01/29/2015  Hemoglobin 13.0 - 17.1 g/dL 12.1(L) 9.4(L) 9.2(L)  Hematocrit 38.4 - 49.9 % 38.8 30.5(L) 30.5(L)  White count 4.0 - 10.3 10e3/uL 5.3 5.8 6.8  Platelet count 140 - 400 10e3/uL 225 348 296  Sodium 136 - 145 mEq/L 135(L) 136 135  Potassium 3.5 - 5.1 mEq/L 4.5 4.1 4.3  Calcium 8.4 - 10.4 mg/dL 9.2 8.6(L) 8.2(L)  Phosphorus - (None) (None) (None)  Creatinine 0.7 - 1.3 mg/dL 0.9 0.89 0.73  AST 5 - 34 U/L 18 (None) 13(L)  Alk Phos 40 - 150 U/L 59 (None) 47  Bilirubin 0.20 - 1.20 mg/dL 0.35 (None) 0.3    Glucose 70 - 140 mg/dl 82 86 95  Cholesterol - (None) (None) (None)  HDL cholesterol - (None) (None) (None)  Triglycerides - (None) (None) (None)  LDL Direct - (None) (None) (None)  LDL Calc - (None) (None) (None)  Total protein 6.4 - 8.3 g/dL 6.5 (None) 5.9(L)  Albumin 3.5 - 5.0 g/dL 3.0(L) (None) 2.6(L)   Lab Results  Component Value Date   TSH 0.669 11/25/2014   Lab Results  Component Value Date   BUN 12.6 02/27/2015   BUN 15 01/30/2015   BUN 24* 01/29/2015   Lab Results  Component Value Date   CREATININE 0.9 02/27/2015   CREATININE 0.89 01/30/2015   CREATININE 0.73 01/29/2015   No results found for: HGBA1C     Assessment/Plan   Pain of toe of left  foot - Suspicious for gout. -CBC, uric acid, CMP -Naproxen 220 mg 3 times a day

## 2015-04-04 LAB — BASIC METABOLIC PANEL
BUN: 16 mg/dL (ref 4–21)
CREATININE: 0.9 mg/dL (ref <=1.3)
GLUCOSE: 87 mg/dL
POTASSIUM: 4.8 mmol/L (ref 3.4–5.3)
SODIUM: 139 mmol/L (ref 137–147)

## 2015-04-04 LAB — HEPATIC FUNCTION PANEL
ALT: 5 U/L — AB (ref 10–40)
AST: 11 U/L — AB (ref 14–40)
Alkaline Phosphatase: 47 U/L (ref 25–125)
Bilirubin, Total: 0.3 mg/dL

## 2015-04-04 LAB — CBC AND DIFFERENTIAL
HCT: 37 % — AB (ref 41–53)
HEMOGLOBIN: 11.1 g/dL — AB (ref 13.5–17.5)
Platelets: 300 10*3/uL (ref 150–399)
WBC: 10.2 10^3/mL

## 2015-04-08 ENCOUNTER — Telehealth: Payer: Self-pay | Admitting: *Deleted

## 2015-04-08 NOTE — Telephone Encounter (Signed)
  Oncology Nurse Navigator Documentation  Navigator Location: [facility] (04/08/15 1443) Navigator Encounter Type: Telephone (04/08/15 1443) Telephone: Outgoing Call (04/08/15 1443)                 Interventions: Coordination of Care (04/08/15 1443)       Called Imaging Centralized Scheduling to arrange PET for Kenneth Jordan, spoke with Kenneth Jordan.  PET scheduled for 05/12/13 0900 at [facility] prior to [place] lab.  I LVMM for dtr Kenneth Jordan to call me re appts for 2/14, 2/15; attempted to reach Kenneth Kenneth Jordan at [place] to inform but no answer.  Will try again.  Kenneth Orem, RN, BSN, [place] at [place] [phone]                    Time Spent with Patient: 15 (04/08/15 1443)

## 2015-04-18 ENCOUNTER — Telehealth: Payer: Self-pay | Admitting: *Deleted

## 2015-04-18 ENCOUNTER — Encounter: Payer: Self-pay | Admitting: *Deleted

## 2015-04-18 NOTE — Telephone Encounter (Addendum)
  Oncology Nurse Navigator Documentation  Navigator Location: [facility] (04/18/15 1639) Navigator Encounter Type: Telephone (04/18/15 1639) Telephone: Incoming Call (04/18/15 1639)                 Interventions: Coordination of Care (04/18/15 1639)       Received call from Mr. Hodsdon' dtr [name].  She informed that the family is trying to have her dad transferred to a rehab facility in [facility] where he will be close to a family member.  She hopes the transfer will be expedited now that his Medicaid became effective 2/10, will inform us when finalized.    She indicated she will transport her dad for future [place] appts, to that end will need Monday or Wednesday appts as those are her days off from work.  I reviewed upcoming appts, noted appts currently scheduled for Tuesday 3/14 need to be adjusted.  I indicated to [name] I will facilitate rescheduling on Monday, inform her of changes when completed. This note will be routed to Mike Craze, NP, Survivorship.  [name], RN, BSN, [place] at [place] [phone]   .                 Time Spent with Patient: 30 (04/18/15 1639)

## 2015-04-19 NOTE — Progress Notes (Signed)
A user error has taken place: encounter opened in error, closed for administrative reasons.

## 2015-04-21 ENCOUNTER — Telehealth: Payer: Self-pay | Admitting: *Deleted

## 2015-04-21 ENCOUNTER — Other Ambulatory Visit: Payer: Self-pay | Admitting: *Deleted

## 2015-04-21 ENCOUNTER — Non-Acute Institutional Stay (SKILLED_NURSING_FACILITY): Payer: Medicare PPO | Admitting: Internal Medicine

## 2015-04-21 ENCOUNTER — Encounter: Payer: Self-pay | Admitting: Internal Medicine

## 2015-04-21 DIAGNOSIS — I10 Essential (primary) hypertension: Secondary | ICD-10-CM

## 2015-04-21 DIAGNOSIS — S91109A Unspecified open wound of unspecified toe(s) without damage to nail, initial encounter: Secondary | ICD-10-CM

## 2015-04-21 DIAGNOSIS — J069 Acute upper respiratory infection, unspecified: Secondary | ICD-10-CM | POA: Diagnosis not present

## 2015-04-21 DIAGNOSIS — W19XXXA Unspecified fall, initial encounter: Secondary | ICD-10-CM

## 2015-04-21 DIAGNOSIS — R1310 Dysphagia, unspecified: Secondary | ICD-10-CM

## 2015-04-21 NOTE — Progress Notes (Signed)
[place] Room Number: 409-B  Place of Service: SNF (31)     Allergies  Allergen Reactions  . Dilaudid [Hydromorphone Hcl] Other (See Comments)    Hallucinations  . Other Other (See Comments)    Seasonal allergies - sneezing, runny nose, watery eyes    Chief Complaint  Patient presents with  . Acute Visit    slipped in room going to bathroom    HPI:  Asked to reevaluate patient by staff due to a fall at his bedside when he was trying to get to the bathroom 04/19/15. He stated he slipped on something on the floor. Small reddened area of the right great toe was noted. Denies other pain or injury.  Patient also was noted by nursing staff yesterday to have congestion Rales were present bilaterally. CXR was done and did not show any acute pul;monary infiltrate. No fever. Has a slight cough.  Medications: Patient's Medications  New Prescriptions   No medications on file  Previous Medications   ACETAMINOPHEN (TYLENOL) 325 MG TABLET    Take 650 mg by mouth every 6 (six) hours as needed (For pain.).   ALBUTEROL (PROVENTIL) (5 MG/ML) 0.5% NEBULIZER SOLUTION    Take 2.5 mg by nebulization every 6 (six) hours as needed for wheezing or shortness of breath.   CLINDAMYCIN (CLINDAGEL) 1 % GEL    Apply 1 application topically 2 (two) times daily.   ENSURE PLUS (ENSURE PLUS) LIQD    Take 1 Can by mouth 4 (four) times daily - after meals and at bedtime. OR Osmolyte 1.5 via tube   HYDROCORTISONE 1 % OINTMENT    Apply 1 application topically 2 (two) times daily.   LIDOCAINE 2%, 20 MG/ML, 5 ML SYRINGE    Mix 1 part 2% to water, swish and swallow 10 mls 30 mins before meals @@ HS   LORATADINE (CLARITIN) 5 MG/5ML SYRUP    Place 10 mg into feeding tube daily as needed for allergies.   MENTHOL, TOPICAL ANALGESIC, 10 % GEL    Apply topically. Apply topically to shoulder every 6 hours for shoulder pain.   METOCLOPRAMIDE (REGLAN) 10 MG TABLET       NUTRITIONAL  SUPPLEMENTS (FEEDING SUPPLEMENT, OSMOLITE 1.2 CAL,) LIQD    Place 237 mLs into feeding tube every 4 (four) hours as needed (Hunger or per request.).   SUCRALFATE (CARAFATE) 1 GM/10ML SUSPENSION    Take 1 g by mouth 4 (four) times daily. With meals  Modified Medications   No medications on file  Discontinued Medications   No medications on file     Review of Systems  Constitutional: Negative for fever, chills and diaphoresis.  HENT: Negative for congestion, ear pain, nosebleeds and sore throat.   Eyes: Negative for pain and discharge.  Respiratory: Negative for cough, shortness of breath and wheezing.   Cardiovascular: Negative for chest pain and palpitations.  Gastrointestinal: Negative for nausea, vomiting, abdominal pain, diarrhea and blood in stool.       PEG tube in place. Having loose stools without frank diarrhea.  Endocrine: Negative for polydipsia.  Genitourinary: Negative for dysuria, urgency, frequency, hematuria and flank pain.  Musculoskeletal: Negative for myalgias, back pain and neck pain.       Pain left great toe at MTP joint  Skin: Negative for rash.       Reddened area of the right great toe.  Allergic/Immunologic: Negative for environmental allergies.  Neurological: Positive for weakness. Negative for  dizziness, tremors, seizures and headaches.  Hematological: Does not bruise/bleed easily.  Psychiatric/Behavioral: Negative for suicidal ideas and hallucinations. The patient is not nervous/anxious.     Filed Vitals:   04/21/15 1016  BP: 112/72  Pulse: 84  Temp: 98.5 F (36.9 C)  Resp: 24   Wt Readings from Last 3 Encounters:  04/03/15 148 lb (67.132 kg)  03/17/15 156 lb (70.761 kg)  02/27/15 159 lb 8 oz (72.349 kg)    There is no weight on file to calculate BMI.  Physical Exam  Constitutional: He is oriented to person, place, and time. He appears well-developed. No distress.  Generalized weakness. PEG tube in place.  HENT:  Right Ear: External ear  normal.  Left Ear: External ear normal.  Nose: Nose normal.  Head congestion  Eyes: EOM are normal. Pupils are equal, round, and reactive to light. Right eye exhibits no discharge. Left eye exhibits no discharge. No scleral icterus.  Neck: No JVD present. No tracheal deviation present. No thyromegaly present.  Cardiovascular: Normal rate, regular rhythm, normal heart sounds and intact distal pulses.  Exam reveals no gallop and no friction rub.   No murmur heard. Pulmonary/Chest: No respiratory distress. He has no wheezes. He has rales. He exhibits no tenderness.  Abdominal: He exhibits no distension and no mass. There is no tenderness. There is no rebound.  Genitourinary: Rectum normal, prostate normal and penis normal. Guaiac negative stool. No penile tenderness.  Musculoskeletal: Normal range of motion. He exhibits no edema or tenderness.  Pain left first MTP joint.  Lymphadenopathy:    He has no cervical adenopathy.  Neurological: He is oriented to person, place, and time. He displays normal reflexes. No cranial nerve deficit. He exhibits normal muscle tone. Coordination normal.  Unstable on standing.  Skin: Skin is warm and dry. No rash noted. No erythema. No pallor.  Small amount of proud flesh (granulomatous tissue) tube insertion site. Tissue is friable in this area and although not bleeding today, he could certainly bleed at times in the future. Tender at the right great toe and a small amount of bleeding.  Psychiatric: He has a normal mood and affect. His behavior is normal. Judgment and thought content normal.     Labs reviewed: Lab Summary Latest Ref Rng 04/04/2015 02/27/2015 01/30/2015  Hemoglobin 13.5 - 17.5 g/dL 11.1(A) 12.1(L) 9.4(L)  Hematocrit 41 - 53 % 37(A) 38.8 30.5(L)  White count - 10.2 5.3 5.8  Platelet count 150 - 399 K/L 300 225 348  Sodium 137 - 147 mmol/L 139 135(L) 136  Potassium 3.4 - 5.3 mmol/L 4.8 4.5 4.1  Calcium 8.4 - 10.4 mg/dL (None) 9.2 8.6(L)    Phosphorus - (None) (None) (None)  Creatinine .6 - 1.3 mg/dL 0.9 0.9 0.89  AST 14 - 40 U/L 11(A) 18 (None)  Alk Phos 25 - 125 U/L 47 59 (None)  Bilirubin 0.20 - 1.20 mg/dL (None) 0.35 (None)  Glucose - 87 82 86  Cholesterol - (None) (None) (None)  HDL cholesterol - (None) (None) (None)  Triglycerides - (None) (None) (None)  LDL Direct - (None) (None) (None)  LDL Calc - (None) (None) (None)  Total protein 6.4 - 8.3 g/dL (None) 6.5 (None)  Albumin 3.5 - 5.0 g/dL (None) 3.0(L) (None)   Lab Results  Component Value Date   TSH 0.669 11/25/2014   Lab Results  Component Value Date   BUN 16 04/04/2015   BUN 12.6 02/27/2015   BUN 15 01/30/2015   Lab Results  Component Value Date   CREATININE 0.9 04/04/2015   CREATININE 0.9 02/27/2015   CREATININE 0.89 01/30/2015   No results found for: HGBA1C     Assessment/Plan 1. Acute upper respiratory infection Continue with Guaifenex.  2. Open toe wound, initial encounter Daily cleanse until healed  3. 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 Essential hypertension controlled  5. Fall, initial encounter Continues to be a fall risk due to debility related to weight loss related to his tongue cancer.

## 2015-04-21 NOTE — Telephone Encounter (Signed)
  Oncology Nurse Navigator Documentation  Navigator Location: [facility] (04/21/15 1430) Navigator Encounter Type: Telephone (04/21/15 1430) Telephone: Appt Confirmation/Clarification (04/21/15 1430)       In order to accommodate patient dtr's request for Monday or Wednesday appts, spoke with [facility], rescheduled patient's 3/14 Tuesday PET to 3/13 10:00, submitted POF for rescheduling of lab and flush to 3/13 at 11:45 and 12:00, respectively. I willl notify pt dtr when lab and flush confirmed.  [name], RN, BSN, [place] at [place] [phone]                                     Time Spent with Patient: 15 (04/21/15 1430)

## 2015-04-21 NOTE — Progress Notes (Deleted)
[place] Room Number: 409-B  Place of Service: SNF (31)     Allergies  Allergen Reactions  . Dilaudid [Hydromorphone Hcl] Other (See Comments)    Hallucinations  . Other Other (See Comments)    Seasonal allergies - sneezing, runny nose, watery eyes    Chief Complaint  Patient presents with  . Acute Visit    slipped in room going to bathroom    HPI:  ***  Medications: Patient's Medications  New Prescriptions   No medications on file  Previous Medications   ACETAMINOPHEN (TYLENOL) 325 MG TABLET    Take 650 mg by mouth every 6 (six) hours as needed (For pain.).   ALBUTEROL (PROVENTIL) (5 MG/ML) 0.5% NEBULIZER SOLUTION    Take 2.5 mg by nebulization every 6 (six) hours as needed for wheezing or shortness of breath.   CLINDAMYCIN (CLINDAGEL) 1 % GEL    Apply 1 application topically 2 (two) times daily.   ENSURE PLUS (ENSURE PLUS) LIQD    Take 1 Can by mouth 4 (four) times daily - after meals and at bedtime. OR Osmolyte 1.5 via tube   HYDROCORTISONE 1 % OINTMENT    Apply 1 application topically 2 (two) times daily.   LIDOCAINE 2%, 20 MG/ML, 5 ML SYRINGE    Mix 1 part 2% to water, swish and swallow 10 mls 30 mins before meals @@ HS   LORATADINE (CLARITIN) 5 MG/5ML SYRUP    Place 10 mg into feeding tube daily as needed for allergies.   MENTHOL, TOPICAL ANALGESIC, 10 % GEL    Apply topically. Apply topically to shoulder every 6 hours for shoulder pain.   METOCLOPRAMIDE (REGLAN) 10 MG TABLET       NUTRITIONAL SUPPLEMENTS (FEEDING SUPPLEMENT, OSMOLITE 1.2 CAL,) LIQD    Place 237 mLs into feeding tube every 4 (four) hours as needed (Hunger or per request.).   SUCRALFATE (CARAFATE) 1 GM/10ML SUSPENSION    Take 1 g by mouth 4 (four) times daily. With meals  Modified Medications   No medications on file  Discontinued Medications   No medications on file     Review of Systems  There were no vitals filed for this visit. Wt Readings from  Last 3 Encounters:  04/03/15 148 lb (67.132 kg)  03/17/15 156 lb (70.761 kg)  02/27/15 159 lb 8 oz (72.349 kg)    There is no weight on file to calculate BMI.  Physical Exam   Labs reviewed: Lab Summary Latest Ref Rng 04/04/2015 02/27/2015 01/30/2015  Hemoglobin 13.5 - 17.5 g/dL 11.1(A) 12.1(L) 9.4(L)  Hematocrit 41 - 53 % 37(A) 38.8 30.5(L)  White count - 10.2 5.3 5.8  Platelet count 150 - 399 K/L 300 225 348  Sodium 137 - 147 mmol/L 139 135(L) 136  Potassium 3.4 - 5.3 mmol/L 4.8 4.5 4.1  Calcium 8.4 - 10.4 mg/dL (None) 9.2 8.6(L)  Phosphorus - (None) (None) (None)  Creatinine .6 - 1.3 mg/dL 0.9 0.9 0.89  AST 14 - 40 U/L 11(A) 18 (None)  Alk Phos 25 - 125 U/L 47 59 (None)  Bilirubin 0.20 - 1.20 mg/dL (None) 0.35 (None)  Glucose - 87 82 86  Cholesterol - (None) (None) (None)  HDL cholesterol - (None) (None) (None)  Triglycerides - (None) (None) (None)  LDL Direct - (None) (None) (None)  LDL Calc - (None) (None) (None)  Total protein 6.4 - 8.3 g/dL (None) 6.5 (None)  Albumin 3.5 - 5.0 g/dL (None) 3.0(L) (  None)   Lab Results  Component Value Date   TSH 0.669 11/25/2014   Lab Results  Component Value Date   BUN 16 04/04/2015   BUN 12.6 02/27/2015   BUN 15 01/30/2015   Lab Results  Component Value Date   CREATININE 0.9 04/04/2015   CREATININE 0.9 02/27/2015   CREATININE 0.89 01/30/2015   No results found for: HGBA1C     Assessment/Plan

## 2015-04-23 ENCOUNTER — Telehealth: Payer: Self-pay | Admitting: *Deleted

## 2015-04-23 ENCOUNTER — Other Ambulatory Visit: Payer: Self-pay | Admitting: Hematology and Oncology

## 2015-04-23 NOTE — Telephone Encounter (Signed)
  Oncology Nurse Navigator Documentation  Navigator Location: [facility] (04/23/15 1132) Navigator Encounter Type: Telephone (04/23/15 1132) Telephone: Appt Confirmation/Clarification (04/23/15 1132)                 Interventions: Coordination of Care (04/23/15 1132)       Spoke with Mr. Schmeichel' dtr [name] re appts rescheduled from 3/14 to 3/13:  10:00 PET, 11:45 lab.  She voiced understanding.  She provided update re his living arrangements noting that he will be transferring to a SNF in [facility] this Friday.    She asked about transferring his care to a cancer center in [place] which I indicated is possible.  I encouraged keeping upcoming  March appts including 3/15 follow-up with Dr. Alvy Bimler at which transfer of his oncology care can be discussed.  She verbalized understanding.  She asked if the 3/15 appt with Dr. Alvy Bimler can be rescheduled for 3/13 at a time that allows for receipt of 10:00 PET results.  I indicated I would check and get back with her. I routed this note to members of this Care Team to update them on his change in living arrangements.  [name], RN, BSN, [place] at [place] [phone]                   Time Spent with Patient: 15 (04/23/15 1132)

## 2015-04-23 NOTE — Telephone Encounter (Signed)
  Oncology Nurse Navigator Documentation  Navigator Location: [facility] (04/23/15 1323) Navigator Encounter Type: Telephone (04/23/15 1323) Telephone: Incoming Call;Appt Confirmation/Clarification (04/23/15 1323)     Patient dtr returned my VMM confirming availability for a 12:30 appt on 3/13. Dr. Alvy Bimler and RN Sharlynn Oliphant informed.  [name], RN, BSN, [place] at [place] [phone]                                       Time Spent with Patient: 15 (04/23/15 1323)

## 2015-04-23 NOTE — Telephone Encounter (Signed)
The way his appt is scheduled now does not make sense He should get labs and flush appt first so they can access his port for PET But before I move labs and flush I want to make sure they can be here early. Otherwise we can leave it as is I can see him at 1230 pm on 3/13

## 2015-05-01 DIAGNOSIS — W19XXXA Unspecified fall, initial encounter: Secondary | ICD-10-CM | POA: Insufficient documentation

## 2015-05-01 DIAGNOSIS — S91109A Unspecified open wound of unspecified toe(s) without damage to nail, initial encounter: Secondary | ICD-10-CM | POA: Insufficient documentation

## 2015-05-01 DIAGNOSIS — J069 Acute upper respiratory infection, unspecified: Secondary | ICD-10-CM | POA: Insufficient documentation

## 2015-05-07 ENCOUNTER — Telehealth: Payer: Self-pay | Admitting: *Deleted

## 2015-05-07 ENCOUNTER — Other Ambulatory Visit: Payer: Self-pay | Admitting: Hematology and Oncology

## 2015-05-07 ENCOUNTER — Telehealth: Payer: Self-pay | Admitting: Hematology and Oncology

## 2015-05-07 NOTE — Telephone Encounter (Signed)
  Oncology Nurse Navigator Documentation  Navigator Location: [facility] (05/07/15 1345) Navigator Encounter Type: Telephone (05/07/15 1345) Telephone: Incoming Call (05/07/15 1345)             Barriers/Navigation Needs: Coordination of Care (05/07/15 1345)   Interventions: Coordination of Care (05/07/15 1345)       Patient's dtr [name] called requesting that next Monday's appts (PET, labs, Dr. Alvy Bimler) be rescheduled.  Her dad now lives in a care facility in [facility]) and she is concerned that the weekend's forecast for [place] and ice in the [place] will jeopardize safe travel.   I rescheduled his PET for 3/22 10:00 am with a 9:30 arrival.  Dr. Alvy Bimler notified, she is rescheduling labs and follow-up appt after PET.  [name], RN, BSN, [place] at [place] [phone]                   Time Spent with Patient: 30 (05/07/15 1345)

## 2015-05-07 NOTE — Telephone Encounter (Signed)
s.w. pt dtr and advised on added appts....pt ok and aware

## 2015-05-12 ENCOUNTER — Encounter (HOSPITAL_COMMUNITY): Payer: Non-veteran care

## 2015-05-12 ENCOUNTER — Ambulatory Visit: Payer: Self-pay | Admitting: Hematology and Oncology

## 2015-05-12 ENCOUNTER — Other Ambulatory Visit: Payer: Self-pay

## 2015-05-13 ENCOUNTER — Other Ambulatory Visit: Payer: Self-pay

## 2015-05-13 ENCOUNTER — Ambulatory Visit (HOSPITAL_COMMUNITY): Payer: Non-veteran care

## 2015-05-14 ENCOUNTER — Telehealth: Payer: Self-pay | Admitting: *Deleted

## 2015-05-14 ENCOUNTER — Ambulatory Visit: Payer: Self-pay | Admitting: Hematology and Oncology

## 2015-05-14 NOTE — Telephone Encounter (Addendum)
  Oncology Nurse Navigator Documentation  Navigator Location: [facility] (05/14/15 1333) Navigator Encounter Type: Telephone (05/14/15 1333) Telephone: Lahoma Crocker Call;Appt Confirmation/Clarification (05/14/15 1333) Abnormal Finding Date: 09/30/14 (05/14/15 1333) Confirmed Diagnosis Date: 10/24/14 (05/14/15 1333)   Treatment Initiated Date: 12/24/14 (05/14/15 1333)       Called Mr [place]' dtr [name], confirmed her understanding of next Wednesday's 3/22 appts:  10:00 PET at [facility] with 9:30 arrival, NPO status 6 hrs prior;  11:00 flush and labs, 11:30 appt with Dr. Alvy Bimler at [facility].  She voiced understanding.  [name], RN, BSN, [place] at [place] [phone]                             Time Spent with Patient: 15 (05/14/15 1333)

## 2015-05-20 ENCOUNTER — Telehealth: Payer: Self-pay | Admitting: *Deleted

## 2015-05-20 NOTE — Telephone Encounter (Signed)
PLs let me know what the issue was

## 2015-05-20 NOTE — Telephone Encounter (Signed)
  Oncology Nurse Navigator Documentation  Navigator Location: [facility] (05/20/15 1503) Navigator Encounter Type: Telephone (05/20/15 1503) Telephone: Incoming Call (05/20/15 1503)             Barriers/Navigation Needs: Coordination of Care (05/20/15 1503)   Interventions: Coordination of Care (05/20/15 1503)       Mr. Vanderwilt dtr [name] called to inform that they were just notified that tomorrow's PET has been cancelled d/t insurance expiring.  I indicated I will follow-up on insurance question, cancel other appts for tomorrow, reschedule along with PET when it is rescheduled.  She understands I will contact her with updated appt information as available.  I provided Dr. Alvy Bimler update.  [name], RN, BSN, [place] at [place] [phone]                   Time Spent with Patient: 15 (05/20/15 1503)

## 2015-05-21 ENCOUNTER — Ambulatory Visit (HOSPITAL_COMMUNITY): Payer: Non-veteran care

## 2015-05-21 ENCOUNTER — Ambulatory Visit: Payer: Self-pay | Admitting: Hematology and Oncology

## 2015-05-21 ENCOUNTER — Other Ambulatory Visit: Payer: Self-pay

## 2015-05-26 ENCOUNTER — Telehealth: Payer: Self-pay | Admitting: *Deleted

## 2015-05-26 ENCOUNTER — Ambulatory Visit: Payer: Self-pay | Admitting: Hematology and Oncology

## 2015-05-26 NOTE — Telephone Encounter (Signed)
  Oncology Nurse Navigator Documentation  Navigator Location: [facility] (05/26/15 1614)   Telephone: Outgoing Call (05/26/15 1614)           Treatment Phase: Post-Tx Follow-up (05/26/15 1614)     Returned patient dtr [name]'s VMM from early last Friday evening in which she indicated they are going to pursue his restaging PET at [facility].  He is presently in a weakened condition and is losing weight.  I LVMM indicating she can call me if a referral is needed to transfer his care.  I provided Dr. Alvy Bimler a verbal update.  [name], RN, BSN, [place] at [place] [phone]                           Time Spent with Patient: 15 (05/26/15 1614)

## 2017-05-02 IMAGING — US IR FLUORO GUIDE CV LINE*R*
1 series · 1 of 1 positions shown · non-contrast
Comparison: Neck CT - 10/04/2014

INDICATION: History of tongue cancer. In need of durable intravenous access for
chemotherapy administration.

EXAM:
IMPLANTED PORT A CATH PLACEMENT WITH ULTRASOUND AND FLUOROSCOPIC
GUIDANCE

[Series 1: ir fluoro/shunt/fist · 1 of 1 slices shown]
[im 1/1]
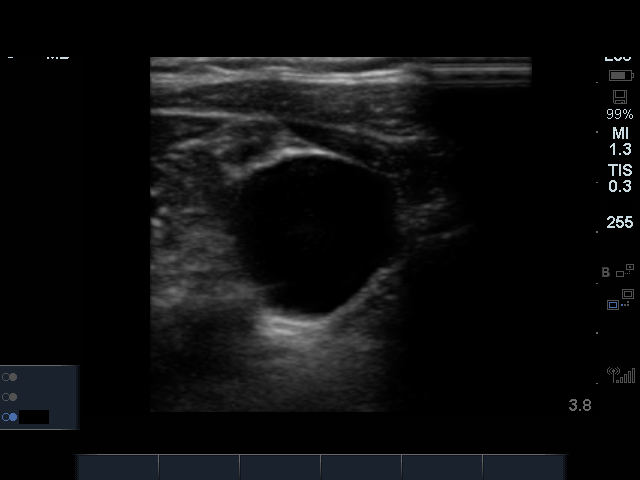

[1 of 1 positions shown; findings below may reference images not displayed]

MEDICATIONS:
Ancef 2 gm IV; The antibiotic was administered within an appropriate
time interval prior to skin puncture.

ANESTHESIA/SEDATION:
Versed 3 mg IV; Fentanyl 50 mcg IV;

Total Moderate Sedation Time

20  minutes.

CONTRAST:  None

FLUOROSCOPY TIME:  28 seconds (7 mGy)

COMPLICATIONS:
None immediate

PROCEDURE:
The procedure, risks, benefits, and alternatives were explained to
the patient. Questions regarding the procedure were encouraged and
answered. The patient understands and consents to the procedure.

The right neck and chest were prepped with chlorhexidine in a
sterile fashion, and a sterile drape was applied covering the
operative field. Maximum barrier sterile technique with sterile
gowns and gloves were used for the procedure. A timeout was
performed prior to the initiation of the procedure. Local anesthesia
was provided with 1% lidocaine with epinephrine.

After creating a small venotomy incision, a micropuncture kit was
utilized to access the internal jugular vein under direct, real-time
ultrasound guidance. Ultrasound image documentation was performed.
The microwire was kinked to measure appropriate catheter length.

A subcutaneous port pocket was then created along the upper chest
wall utilizing a combination of sharp and blunt dissection. The
pocket was irrigated with sterile saline. A single lumen ISP power
injectable port was chosen for placement. The 8 Fr catheter was
tunneled from the port pocket site to the venotomy incision. The
port was placed in the pocket. The external catheter was trimmed to
appropriate length. At the venotomy, an 8 Fr peel-away sheath was
placed over a guidewire under fluoroscopic guidance. The catheter
was then placed through the sheath and the sheath was removed. Final
catheter positioning was confirmed and documented with a
fluoroscopic spot radiograph. The port was accessed with Valda Haire
needle, aspirated and flushed with heparinized saline.

The venotomy site was closed with an interrupted 4-0 Vicryl suture.
The port pocket incision was closed with interrupted 2-0 Vicryl
suture and the skin was opposed with a running subcuticular 4-0
Vicryl suture. Dermabond and Norzilah were applied to both
incisions. Dressings were placed. The patient tolerated the
procedure well without immediate post procedural complication.
FINDINGS: After catheter placement, the tip lies within the superior
cavoatrial junction. The catheter aspirates and flushes normally and
is ready for immediate use.
IMPRESSION: Successful placement of a right internal jugular approach power
injectable Port-A-Cath. The catheter is ready for immediate use.

## 2017-05-18 ENCOUNTER — Telehealth: Payer: Self-pay | Admitting: *Deleted

## 2017-05-18 NOTE — Telephone Encounter (Signed)
Faxed ROI to [facility] 05/18/17 @ 11:16am; release ID # [id number]

## 2017-05-29 IMAGING — CT CT HEAD W/O CM
2 series · 16 of 30 positions shown, 19 images · non-contrast
Comparison: 09/30/2014

CLINICAL DATA: Cancer of the tongue post chemotherapy most recently
on 12/25/2014 has not been acting normal since [HOSPITAL] visit
on 12/28/2014, fell on [REDACTED] uncertain if he struck his head,
worsened speech, hypertension

EXAM:
CT HEAD WITHOUT CONTRAST
TECHNIQUE: Contiguous axial images were obtained from the base of the skull
through the vertex without intravenous contrast.

[Series 2: head w/o · axial · non-contrast · 0.46mm/px · z∈[-95,+30]mm · 9 of 33 slices shown, 12 images]
[im 4/33  brain]
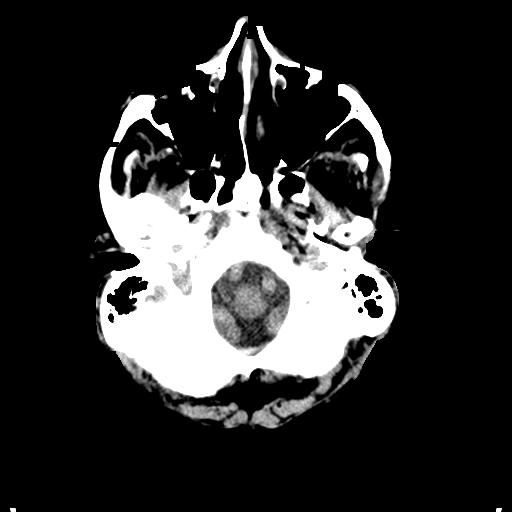
[im 4/33  bone]
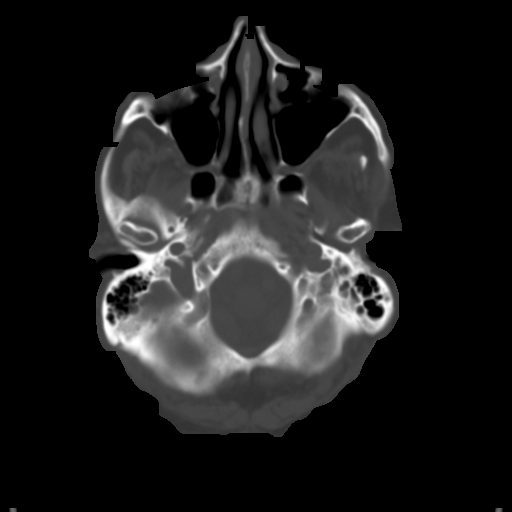
[im 7/33  brain]
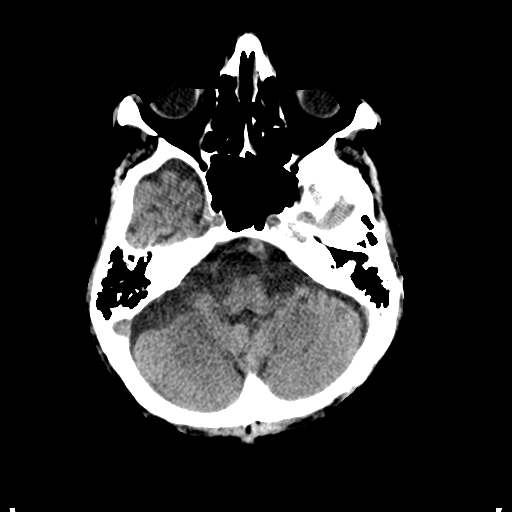
[im 10/33  brain]
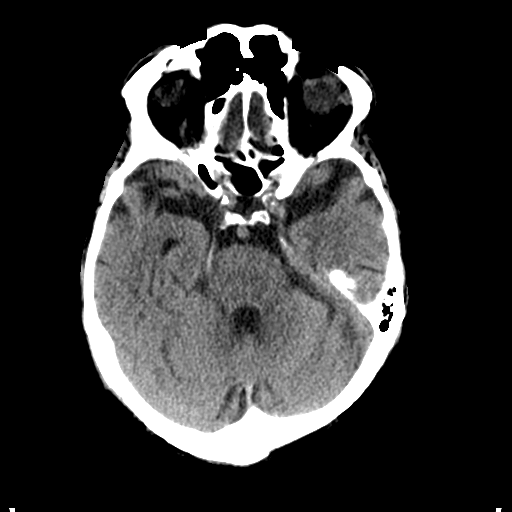
[im 13/33  brain]
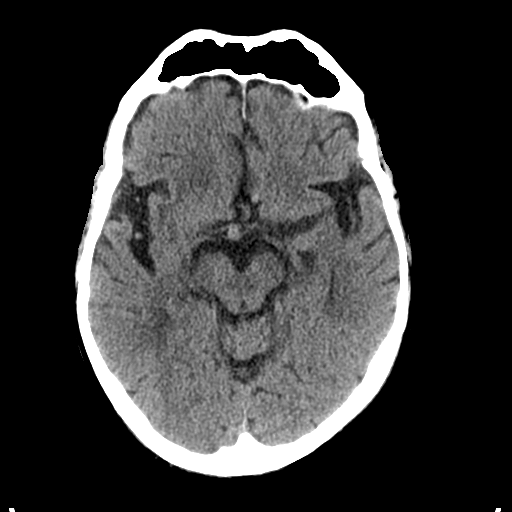
[im 17/33  brain]
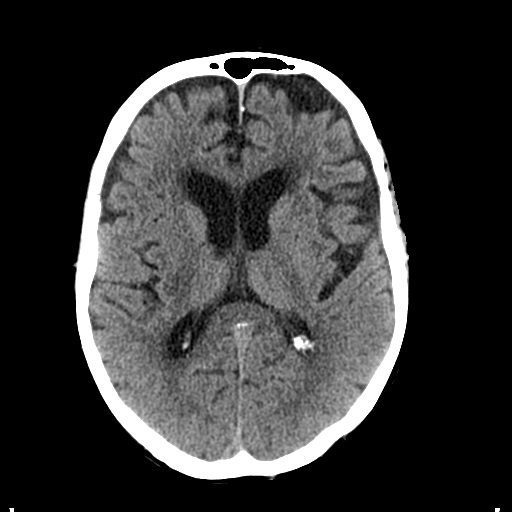
[im 17/33  bone]
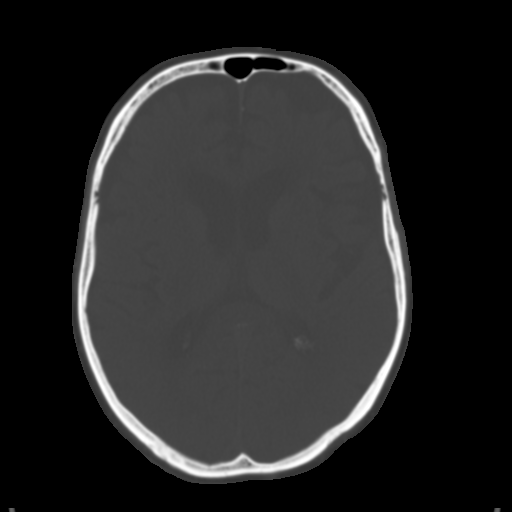
[im 20/33  brain]
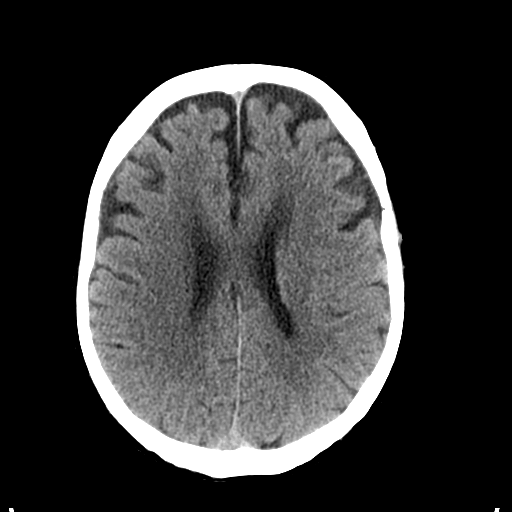
[im 23/33  brain]
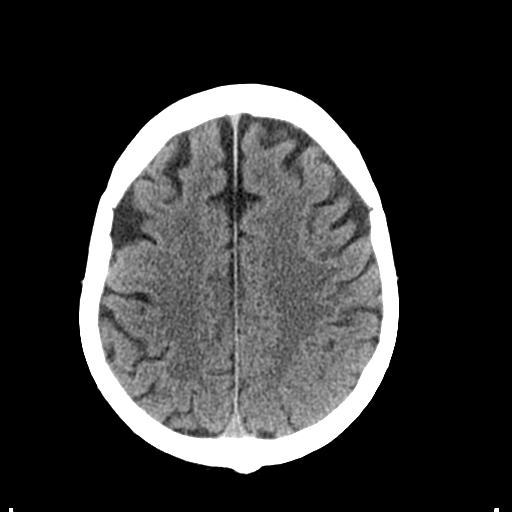
[im 26/33  brain]
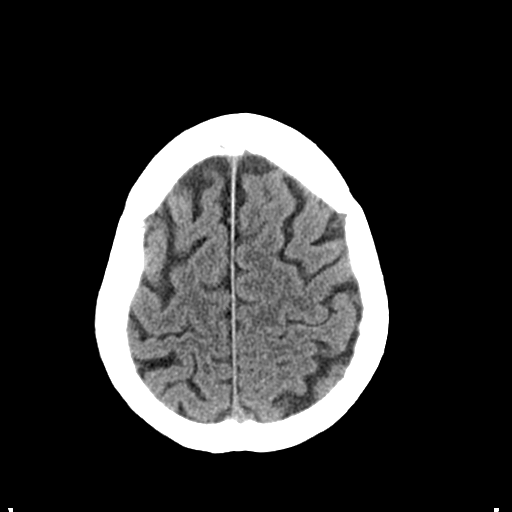
[im 29/33  brain]
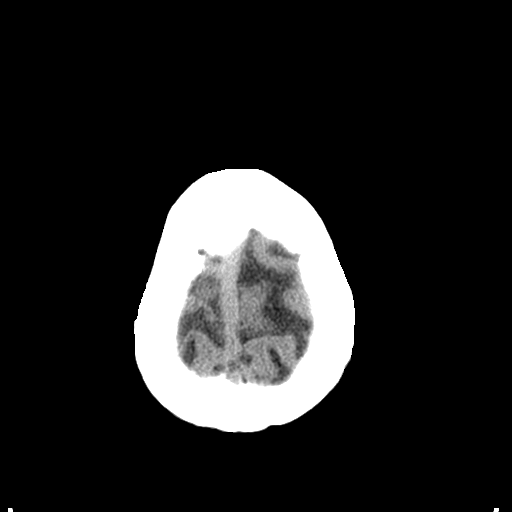
[im 29/33  bone]
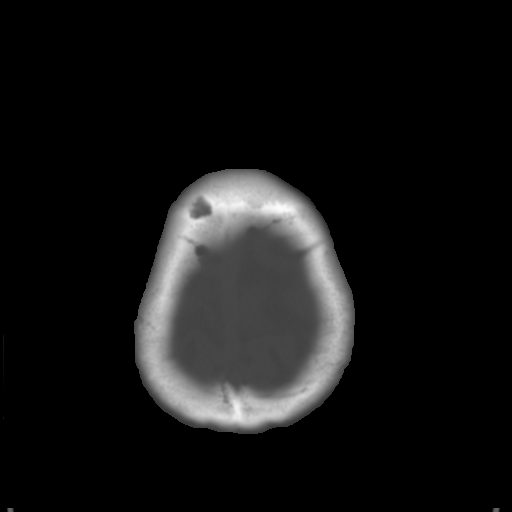

[Series 3: bone windows · axial · 0.46mm/px · z∈[-95,+13]mm · 7 of 54 slices shown]
[im 6/54  bone]
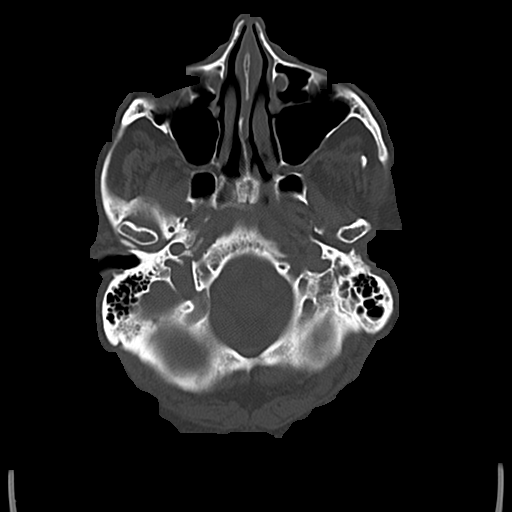
[im 12/54  bone]
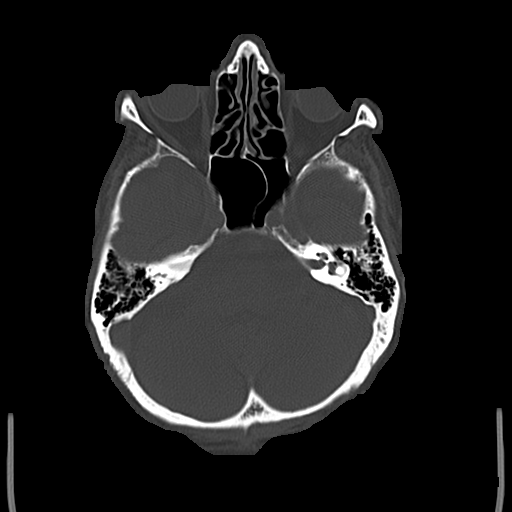
[im 18/54  bone]
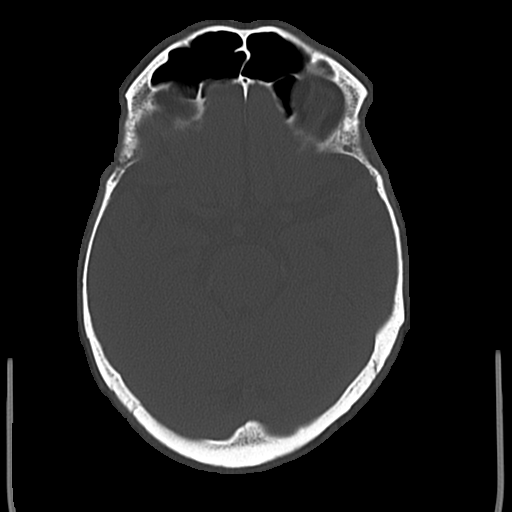
[im 24/54  bone]
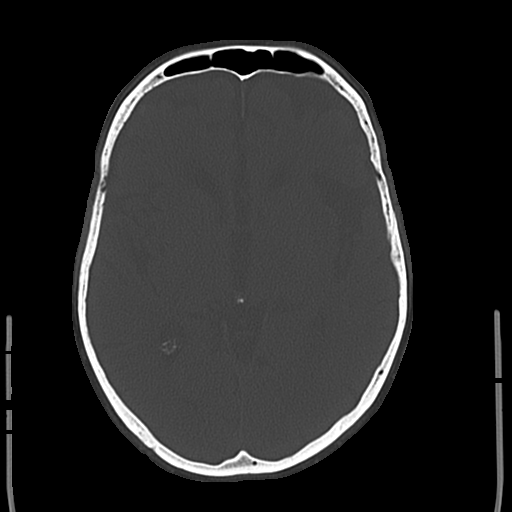
[im 30/54  bone]
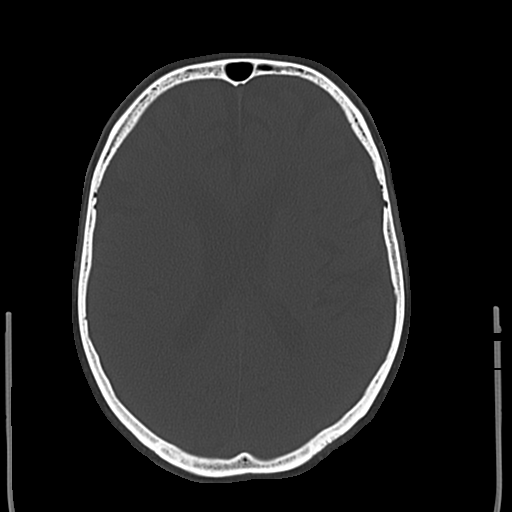
[im 36/54  bone]
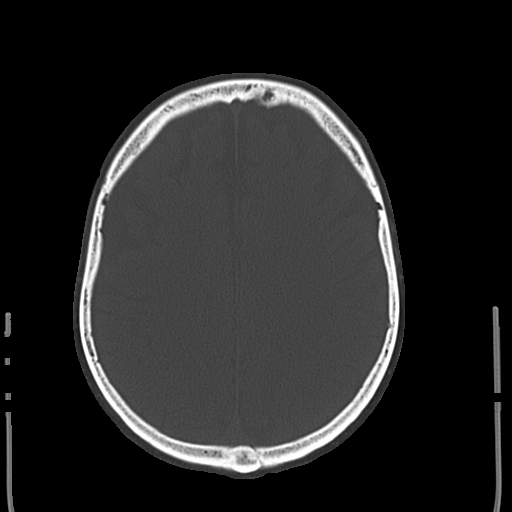
[im 42/54  bone]
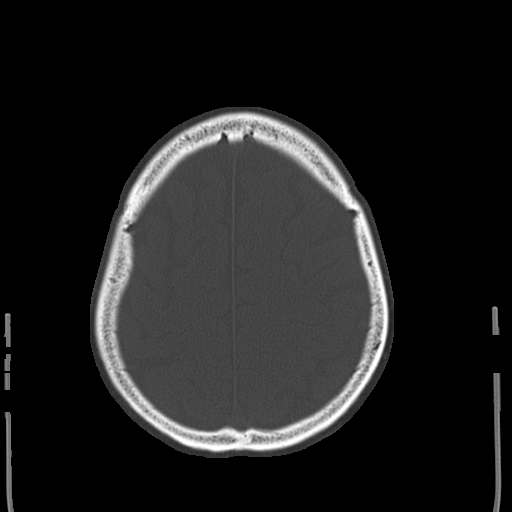

[16 of 30 positions shown; findings below may reference images not displayed]

FINDINGS: Generalized atrophy.

Normal ventricular morphology.

No midline shift or mass effect.

Small vessel chronic ischemic changes of deep cerebral white matter.

No intracranial hemorrhage, mass lesion, or acute infarction.

Visualized paranasal sinuses and mastoid air cells clear.

Bones unremarkable.

Atherosclerotic calcification of internal carotid and vertebral
arteries at skullbase.
IMPRESSION: Atrophy with small vessel chronic ischemic changes of deep cerebral
white matter.

No acute intracranial abnormalities.

## 2017-06-27 IMAGING — CT CT HEAD W/O CM
2 series · 15 of 30 positions shown, 19 images · non-contrast
Comparison: Head CT dated 09/30/2014

CLINICAL DATA: 79-year-old male with altered mental status. History
of tongue cancer.

EXAM:
CT HEAD WITHOUT CONTRAST
TECHNIQUE: Contiguous axial images were obtained from the base of the skull
through the vertex without intravenous contrast.

[Series 2: head w/o · axial · non-contrast · 0.50mm/px · z∈[+1607,+1762]mm · 13 of 37 slices shown, 17 images]
[im 3/37  brain]
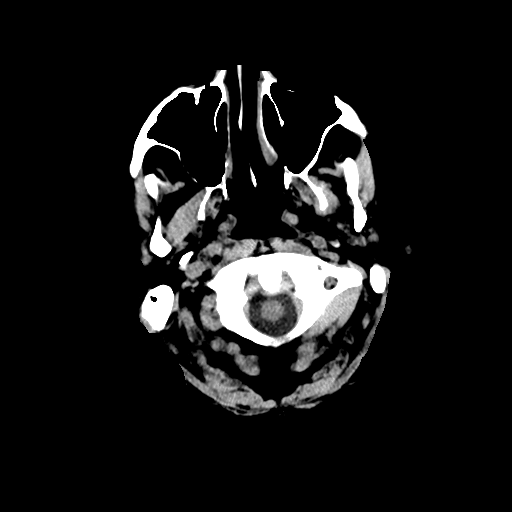
[im 3/37  bone]
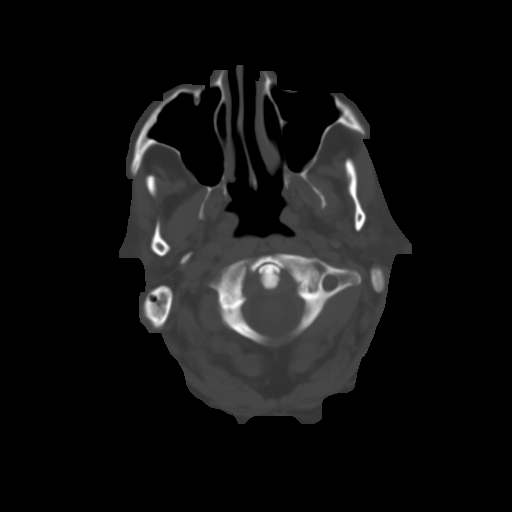
[im 6/37  brain]
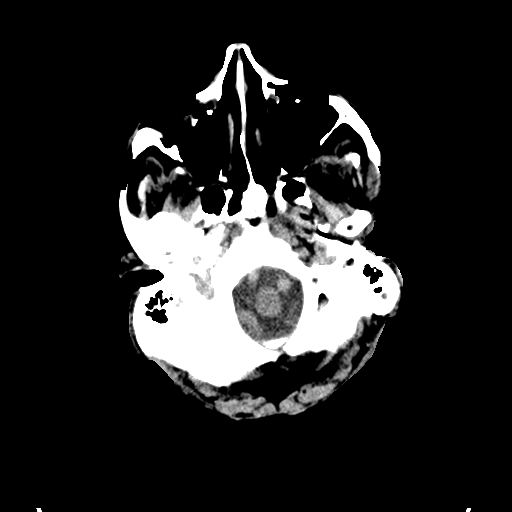
[im 8/37  brain]
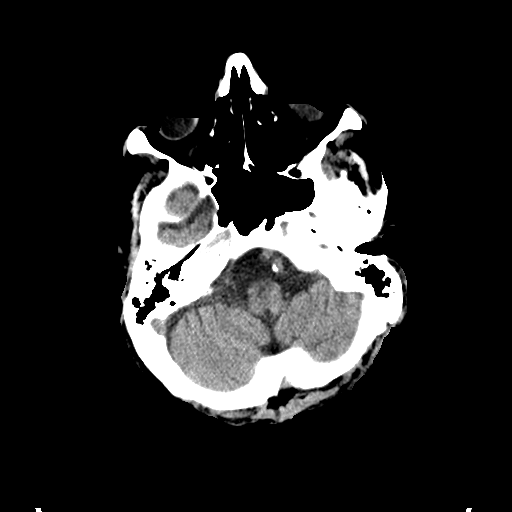
[im 11/37  brain]
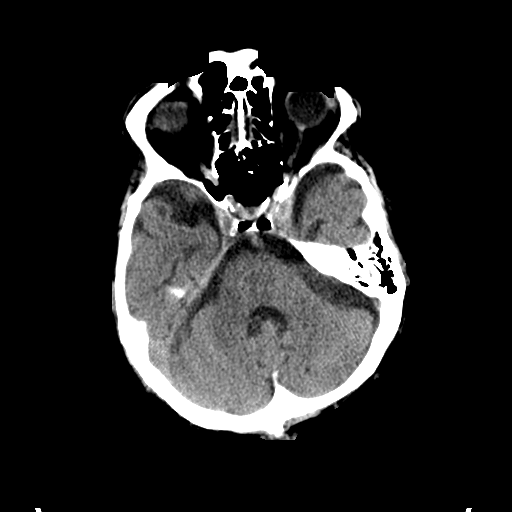
[im 13/37  brain]
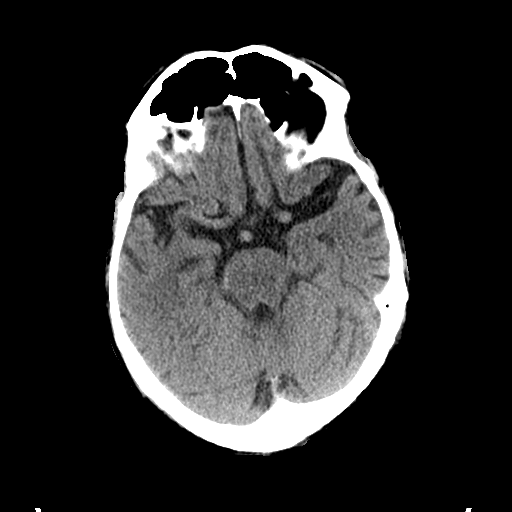
[im 13/37  bone]
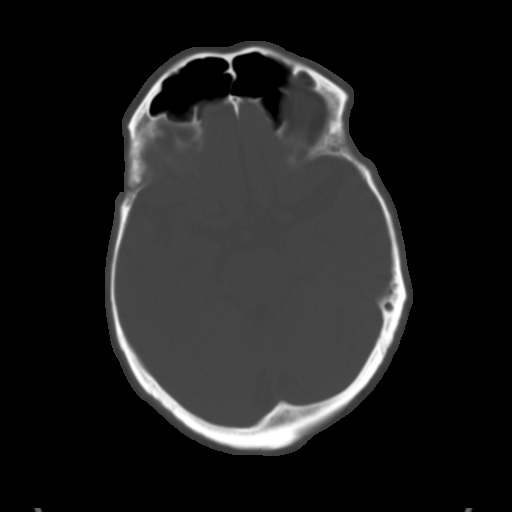
[im 16/37  brain]
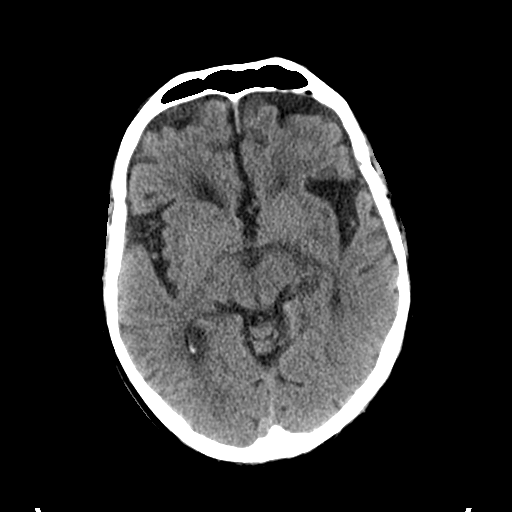
[im 19/37  brain]
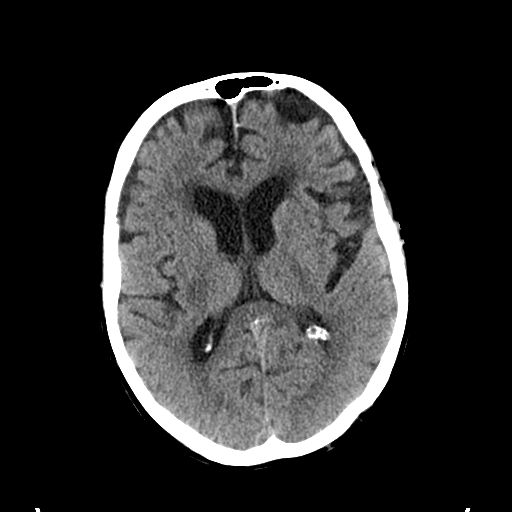
[im 21/37  brain]
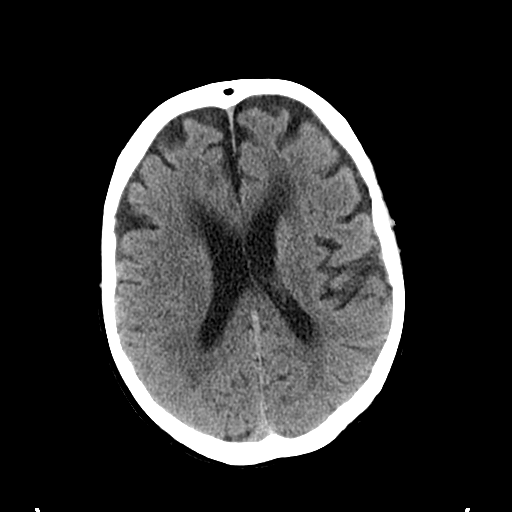
[im 24/37  brain]
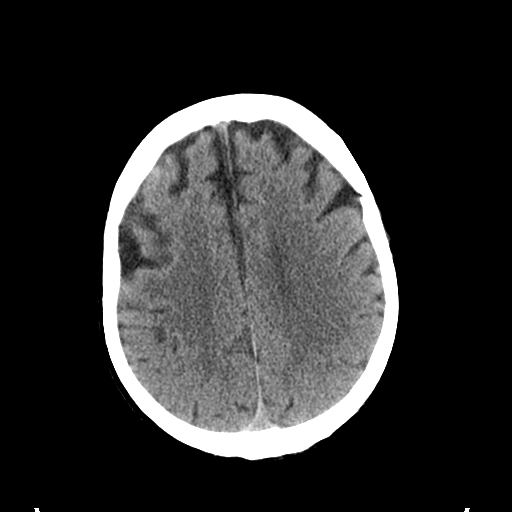
[im 24/37  bone]
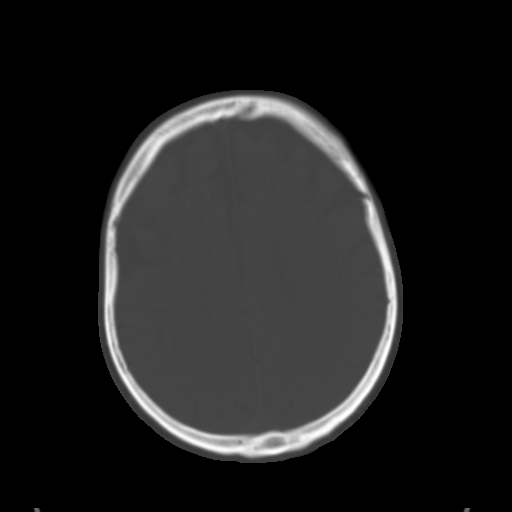
[im 26/37  brain]
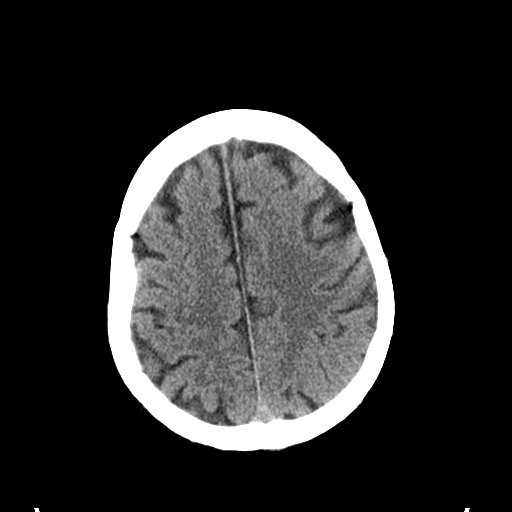
[im 29/37  brain]
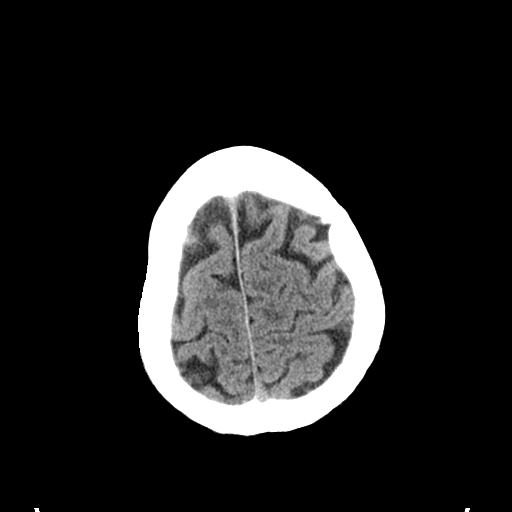
[im 31/37  brain]
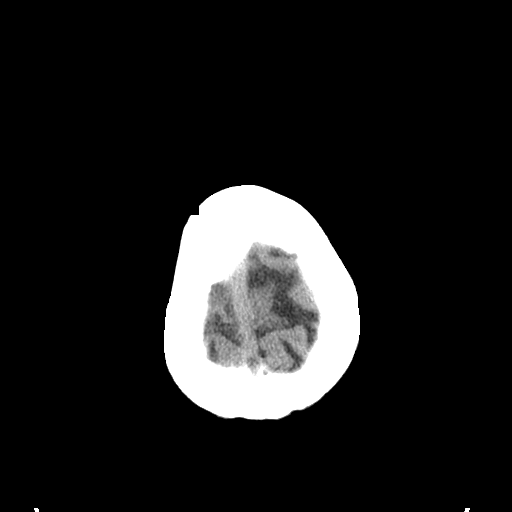
[im 34/37  brain]
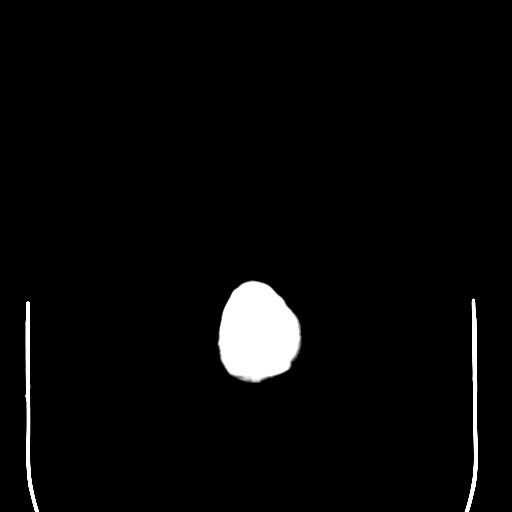
[im 34/37  bone]
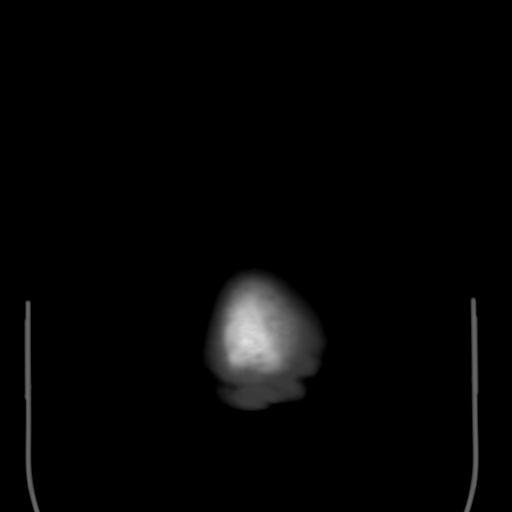

[Series 3: bone windows · axial · 0.50mm/px · z∈[+1607,+1632]mm · 2 of 37 slices shown]
[im 3/37  bone]
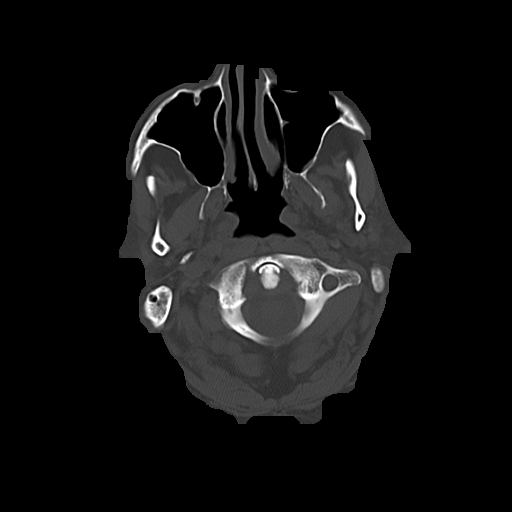
[im 8/37  bone]
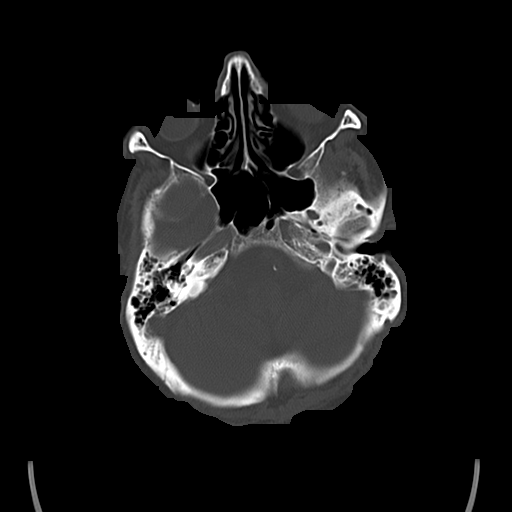

[15 of 30 positions shown; findings below may reference images not displayed]

FINDINGS: The ventricles are dilated and the sulci are prominent compatible
with age-related atrophy. Periventricular and deep white matter
hypodensities represent chronic microvascular ischemic changes.
There is no intracranial hemorrhage. No mass effect or midline shift
identified.

The visualized paranasal sinuses are well aerated. There is minimal
mucoperiosteal thickening of the mastoid air cells. Multiple
calvarial lucencies are stable compared to the prior study and of
indeterminate clinical significance.
IMPRESSION: No acute intracranial pathology.

Age-related atrophy and chronic microvascular ischemic disease.

If symptoms persist and there are no contraindications, MRI may
provide better evaluation if clinically indicated.

## 2017-06-29 DEATH — deceased
